# Patient Record
Sex: Female | Born: 1955 | Race: White | Hispanic: No | Marital: Married | Smoking: Never smoker
Health system: Southern US, Community
[De-identification: ages and names within clinical notes are randomized; demographics above are authoritative.]

## PROBLEM LIST (undated history)

## (undated) DIAGNOSIS — R519 Headache, unspecified: Secondary | ICD-10-CM

## (undated) DIAGNOSIS — Z923 Personal history of irradiation: Secondary | ICD-10-CM

## (undated) DIAGNOSIS — R0600 Dyspnea, unspecified: Secondary | ICD-10-CM

## (undated) DIAGNOSIS — E039 Hypothyroidism, unspecified: Secondary | ICD-10-CM

## (undated) DIAGNOSIS — C801 Malignant (primary) neoplasm, unspecified: Secondary | ICD-10-CM

## (undated) DIAGNOSIS — I10 Essential (primary) hypertension: Secondary | ICD-10-CM

## (undated) HISTORY — DX: Malignant (primary) neoplasm, unspecified: C80.1

## (undated) HISTORY — DX: Personal history of irradiation: Z92.3

## (undated) HISTORY — PX: ABDOMINAL HYSTERECTOMY: SHX81

---

## 2005-05-13 ENCOUNTER — Ambulatory Visit: Payer: Self-pay | Admitting: Unknown Physician Specialty

## 2006-07-13 ENCOUNTER — Ambulatory Visit: Payer: Self-pay | Admitting: Unknown Physician Specialty

## 2007-07-18 ENCOUNTER — Ambulatory Visit: Payer: Self-pay | Admitting: Unknown Physician Specialty

## 2009-04-21 ENCOUNTER — Ambulatory Visit: Payer: Self-pay | Admitting: Unknown Physician Specialty

## 2009-04-24 ENCOUNTER — Ambulatory Visit: Payer: Self-pay | Admitting: Unknown Physician Specialty

## 2009-04-29 ENCOUNTER — Ambulatory Visit: Payer: Self-pay | Admitting: Unknown Physician Specialty

## 2009-07-16 ENCOUNTER — Ambulatory Visit: Payer: Self-pay

## 2011-04-06 ENCOUNTER — Ambulatory Visit: Payer: Self-pay | Admitting: Family Medicine

## 2011-04-20 ENCOUNTER — Ambulatory Visit: Payer: Self-pay | Admitting: Family Medicine

## 2012-10-19 DIAGNOSIS — Z8589 Personal history of malignant neoplasm of other organs and systems: Secondary | ICD-10-CM

## 2012-10-19 HISTORY — DX: Personal history of malignant neoplasm of other organs and systems: Z85.89

## 2013-11-09 LAB — HM PAP SMEAR: HM PAP: NORMAL

## 2013-12-24 DIAGNOSIS — E890 Postprocedural hypothyroidism: Secondary | ICD-10-CM | POA: Insufficient documentation

## 2014-02-28 ENCOUNTER — Ambulatory Visit: Payer: Self-pay | Admitting: Nurse Practitioner

## 2014-05-11 LAB — HM MAMMOGRAPHY: HM MAMMO: NORMAL

## 2014-09-10 ENCOUNTER — Ambulatory Visit (INDEPENDENT_AMBULATORY_CARE_PROVIDER_SITE_OTHER): Payer: Self-pay | Admitting: Internal Medicine

## 2014-09-10 ENCOUNTER — Encounter: Payer: Self-pay | Admitting: Internal Medicine

## 2014-09-10 VITALS — BP 120/82 | HR 65 | Temp 97.7°F | Ht 64.25 in | Wt 139.0 lb

## 2014-09-10 DIAGNOSIS — Z1211 Encounter for screening for malignant neoplasm of colon: Secondary | ICD-10-CM | POA: Diagnosis not present

## 2014-09-10 DIAGNOSIS — E039 Hypothyroidism, unspecified: Secondary | ICD-10-CM

## 2014-09-10 DIAGNOSIS — G4700 Insomnia, unspecified: Secondary | ICD-10-CM | POA: Diagnosis not present

## 2014-09-10 DIAGNOSIS — G43809 Other migraine, not intractable, without status migrainosus: Secondary | ICD-10-CM | POA: Diagnosis not present

## 2014-09-10 DIAGNOSIS — G43909 Migraine, unspecified, not intractable, without status migrainosus: Secondary | ICD-10-CM | POA: Insufficient documentation

## 2014-09-10 MED ORDER — ZOLPIDEM TARTRATE 10 MG PO TABS: 10.0000 mg | ORAL_TABLET | Freq: Every day | ORAL | Status: DC

## 2014-09-10 MED ORDER — LEVOTHYROXINE SODIUM 50 MCG PO TABS: 50.0000 ug | ORAL_TABLET | Freq: Every day | ORAL | Status: DC

## 2014-09-10 MED ORDER — SUMATRIPTAN SUCCINATE 50 MG PO TABS: 50.0000 mg | ORAL_TABLET | ORAL | Status: DC | PRN

## 2014-09-10 NOTE — Assessment & Plan Note (Signed)
Will set up referral to Dr. Hilarie Fredrickson for colonoscopy.

## 2014-09-10 NOTE — Patient Instructions (Addendum)
Fasting labs next week.  Follow up in 4 weeks.

## 2014-09-10 NOTE — Assessment & Plan Note (Signed)
Will check TSH with labs. Reviewed notes from Dr. Gabriel Carina. Continue Levothyroxine.

## 2014-09-10 NOTE — Assessment & Plan Note (Signed)
Symptoms controlled with prn use of Ambien. Will continue. She understands the potential risks of this medication.

## 2014-09-10 NOTE — Progress Notes (Signed)
Subjective:    Patient ID: Jennifer Clayton, female    DOB: [birth year], 59 y.o.   MRN: [id number]  HPI  59YO female presents to establish care.  Migraines - Has had migraines for years. Typically has migraines only intermittently now, about 2x per month. Some food such as cantelope, watermelon trigger headaches. Weather can trigger headaches. Imitrex alleviates headache. Takes only 1/4 tablet. Has pain located in right eye and temple during headache. No blurred vision. No nausea recently.Pos photo and phone phobia.  Insomnia - Uses Ambien on occasion for sleep. Typically with work stressors. No issues with medication. Only uses 1/4 to 1/2 tablet.  Hypothyroidism - Followed by Dr. Mickie Kay. Compliant with medication.   Past Medical History  Diagnosis Date  . Cancer     skin cancer (left leg)  . H/O radioactive iodine thyroid ablation    Family History  Problem Relation Age of Onset  . Cancer Mother     lung, non-smoker  . Dementia Father     related to traumatic brain injury   Past Surgical History  Procedure Laterality Date  . Abdominal hysterectomy      Age 80, for bleeding and cyst  . Vaginal delivery     Social History   Social History  . Marital Status: Married    Spouse Name: N/A  . Number of Children: N/A  . Years of Education: N/A   Social History Main Topics  . Smoking status: Never Smoker   . Smokeless tobacco: None  . Alcohol Use: 0.6 oz/week    1 Glasses of wine per week  . Drug Use: None  . Sexual Activity: Not Asked   Other Topics Concern  . None   Social History Narrative   Lives in [place] with husband and son and 4 children.      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  Eyes: Negative for visual disturbance.  Respiratory:  Negative for shortness of breath.   Cardiovascular: Negative for chest pain and leg swelling.  Gastrointestinal: Negative for nausea, vomiting, abdominal pain, diarrhea and constipation.  Musculoskeletal: Negative for myalgias and arthralgias.  Skin: Negative for color change and rash.  Hematological: Negative for adenopathy. Does not bruise/bleed easily.  Psychiatric/Behavioral: Positive for sleep disturbance. Negative for dysphoric mood. The patient is not nervous/anxious.        Objective:    BP 120/82 mmHg  Pulse 65  Temp(Src) 97.7 F (36.5 C) (Oral)  Ht 5' 4.25" (1.632 m)  Wt 139 lb (63.05 kg)  BMI 23.67 kg/m2  SpO2 99% Physical Exam  Constitutional: She is oriented to person, place, and time. She appears well-developed and well-nourished. No distress.  HENT:  Head: Normocephalic and atraumatic.  Right Ear: External ear normal.  Left Ear: External ear normal.  Nose: Nose normal.  Mouth/Throat: Oropharynx is clear and moist. No oropharyngeal exudate.  Eyes: Conjunctivae and EOM are normal. Pupils are equal, round, and reactive to light. Right eye exhibits no discharge.  Neck: Normal range of motion. Neck supple. No thyromegaly present.  Cardiovascular: Normal rate, regular rhythm, normal heart sounds and intact distal pulses.  Exam reveals no gallop and no friction rub.   No murmur heard. Pulmonary/Chest: Effort normal. No respiratory distress.  She has no wheezes. She has no rales.  Abdominal: Soft. Bowel sounds are normal. She exhibits no distension and no mass. There is no tenderness. There is no rebound and no guarding.  Musculoskeletal: Normal range of motion. She exhibits no edema or tenderness.  Lymphadenopathy:    She has no cervical adenopathy.  Neurological: She is alert and oriented to person, place, and time. No cranial nerve deficit. Coordination normal.  Skin: Skin is warm and dry. No rash noted. She is not diaphoretic. No erythema. No pallor.  Psychiatric: She  has a normal mood and affect. Her behavior is normal. Judgment and thought content normal.          Assessment & Plan:   Problem List Items Addressed This Visit      Unprioritized   Hypothyroidism - Primary    Will check TSH with labs. Reviewed notes from Dr. Gabriel Carina. Continue Levothyroxine.      Relevant Medications   levothyroxine (SYNTHROID, LEVOTHROID) 50 MCG tablet   Other Relevant Orders   TSH   Insomnia    Symptoms controlled with prn use of Ambien. Will continue. She understands the potential risks of this medication.      Migraine    Symptoms well controlled with prn Sumitriptan. Will continue. Follow up prn.      Relevant Medications   SUMAtriptan (IMITREX) 50 MG tablet   Other Relevant Orders   CBC with Differential/Platelet   Comprehensive metabolic panel   Lipid panel   Special screening for malignant neoplasms, colon    Will set up referral to Dr. Hilarie Fredrickson for colonoscopy.      Relevant Orders   Ambulatory referral to Gastroenterology       Return in about 4 weeks (around 10/08/2014) for Recheck.

## 2014-09-10 NOTE — Assessment & Plan Note (Signed)
Symptoms well controlled with prn Sumitriptan. Will continue. Follow up prn.

## 2014-09-26 ENCOUNTER — Other Ambulatory Visit (INDEPENDENT_AMBULATORY_CARE_PROVIDER_SITE_OTHER): Payer: BLUE CROSS/BLUE SHIELD

## 2014-09-26 DIAGNOSIS — G43809 Other migraine, not intractable, without status migrainosus: Secondary | ICD-10-CM

## 2014-09-26 DIAGNOSIS — E039 Hypothyroidism, unspecified: Secondary | ICD-10-CM | POA: Diagnosis not present

## 2014-10-02 ENCOUNTER — Telehealth: Payer: Self-pay | Admitting: *Deleted

## 2014-10-02 ENCOUNTER — Other Ambulatory Visit (INDEPENDENT_AMBULATORY_CARE_PROVIDER_SITE_OTHER): Payer: BLUE CROSS/BLUE SHIELD

## 2014-10-02 DIAGNOSIS — E039 Hypothyroidism, unspecified: Secondary | ICD-10-CM

## 2014-10-02 DIAGNOSIS — G43809 Other migraine, not intractable, without status migrainosus: Secondary | ICD-10-CM

## 2014-10-02 DIAGNOSIS — I519 Heart disease, unspecified: Principal | ICD-10-CM

## 2014-10-02 DIAGNOSIS — G43009 Migraine without aura, not intractable, without status migrainosus: Secondary | ICD-10-CM

## 2014-10-02 LAB — CBC WITH DIFFERENTIAL/PLATELET
BASOS ABS: 0.1 10*3/uL (ref 0.0–0.1)
Basophils Relative: 1 % (ref 0.0–3.0)
Eosinophils Absolute: 0.1 10*3/uL (ref 0.0–0.7)
Eosinophils Relative: 1.3 % (ref 0.0–5.0)
HEMATOCRIT: 39.8 % (ref 36.0–46.0)
Hemoglobin: 13.3 g/dL (ref 12.0–15.0)
LYMPHS ABS: 2 10*3/uL (ref 0.7–4.0)
LYMPHS PCT: 30.9 % (ref 12.0–46.0)
MCHC: 33.4 g/dL (ref 30.0–36.0)
MCV: 93.6 fl (ref 78.0–100.0)
MONOS PCT: 6.7 % (ref 3.0–12.0)
Monocytes Absolute: 0.4 10*3/uL (ref 0.1–1.0)
NEUTROS PCT: 60.1 % (ref 43.0–77.0)
Neutro Abs: 3.9 10*3/uL (ref 1.4–7.7)
Platelets: 241 10*3/uL (ref 150.0–400.0)
RBC: 4.26 Mil/uL (ref 3.87–5.11)
RDW: 12.8 % (ref 11.5–15.5)
WBC: 6.4 10*3/uL (ref 4.0–10.5)

## 2014-10-02 LAB — COMPREHENSIVE METABOLIC PANEL
ALK PHOS: 55 U/L (ref 39–117)
ALT: 9 U/L (ref 0–35)
AST: 14 U/L (ref 0–37)
Albumin: 4.2 g/dL (ref 3.5–5.2)
BILIRUBIN TOTAL: 0.6 mg/dL (ref 0.2–1.2)
BUN: 10 mg/dL (ref 6–23)
CO2: 30 mEq/L (ref 19–32)
Calcium: 9 mg/dL (ref 8.4–10.5)
Chloride: 101 mEq/L (ref 96–112)
Creatinine, Ser: 0.78 mg/dL (ref 0.40–1.20)
GFR: 79.66 mL/min (ref >60.00)
Glucose, Bld: 92 mg/dL (ref 70–99)
Potassium: 4.6 mEq/L (ref 3.5–5.1)
Sodium: 136 mEq/L (ref 135–145)
TOTAL PROTEIN: 6.6 g/dL (ref 6.0–8.3)

## 2014-10-02 LAB — TSH: TSH: 12.94 u[IU]/mL — AB (ref 0.35–4.50)

## 2014-10-02 LAB — LIPID PANEL
CHOLESTEROL: 171 mg/dL (ref 0–200)
HDL: 52.7 mg/dL (ref >39.00)
LDL Cholesterol: 105 mg/dL — ABNORMAL HIGH (ref 0–99)
NonHDL: 118.21
Total CHOL/HDL Ratio: 3
Triglycerides: 68 mg/dL (ref 0.0–149.0)
VLDL: 13.6 mg/dL (ref 0.0–40.0)

## 2014-10-02 NOTE — Telephone Encounter (Signed)
Labs are in system

## 2014-10-02 NOTE — Telephone Encounter (Signed)
Labs and dx?  

## 2014-10-13 ENCOUNTER — Encounter: Payer: Self-pay | Admitting: Internal Medicine

## 2014-10-13 ENCOUNTER — Ambulatory Visit (INDEPENDENT_AMBULATORY_CARE_PROVIDER_SITE_OTHER): Payer: BLUE CROSS/BLUE SHIELD | Admitting: Internal Medicine

## 2014-10-13 VITALS — BP 125/83 | HR 64 | Temp 97.8°F | Ht 64.25 in | Wt 132.0 lb

## 2014-10-13 DIAGNOSIS — E039 Hypothyroidism, unspecified: Secondary | ICD-10-CM

## 2014-10-13 MED ORDER — LEVOTHYROXINE SODIUM 75 MCG PO TABS: 75.0000 ug | ORAL_TABLET | Freq: Every day | ORAL | Status: DC

## 2014-10-13 NOTE — Progress Notes (Signed)
Pre visit review using our clinic review tool, if applicable. No additional management support is needed unless otherwise documented below in the visit note. 

## 2014-10-13 NOTE — Assessment & Plan Note (Signed)
Lab Results  Component Value Date   TSH 12.94* 10/02/2014   Thyroid levels low on Levothyroxine 92mcg daily. Will increase to 65mcg daily. Recheck TSH in 6-8 weeks.

## 2014-10-13 NOTE — Patient Instructions (Signed)
Increase Levothyroxine to 75mg  daily.  Recheck labs in 6-8 weeks.

## 2014-10-13 NOTE — Progress Notes (Signed)
Subjective:    Patient ID: Jennifer Clayton, female    DOB: 1955/06/05, 59 y.o.   MRN: [id number]  HPI  59YO female presents for follow up.  Last seen 8/10 to establish care. TSH was noted to be elevated at 12.94.  Feeling well. No concerns today.    Past Medical History  Diagnosis Date  . Cancer     skin cancer (left leg)  . H/O radioactive iodine thyroid ablation    Family History  Problem Relation Age of Onset  . Cancer Mother     lung, non-smoker  . Dementia Father     related to traumatic brain injury   Past Surgical History  Procedure Laterality Date  . Abdominal hysterectomy      Age 56, for bleeding and cyst  . Vaginal delivery     Social History   Social History  . Marital Status: Married    Spouse Name: N/A  . Number of Children: N/A  . Years of Education: N/A   Social History Main Topics  . Smoking status: Never Smoker   . Smokeless tobacco: None  . Alcohol Use: 0.6 oz/week    1 Glasses of wine per week  . Drug Use: None  . Sexual Activity: Not Asked   Other Topics Concern  . None   Social History Narrative   Lives in [place] with husband and son and 4 children.      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  Eyes: Negative for visual disturbance.  Respiratory: Negative for shortness of breath.   Cardiovascular: Negative for chest pain and leg swelling.  Gastrointestinal: Negative for nausea, vomiting, abdominal pain, diarrhea and constipation.  Endocrine: Negative for cold intolerance and heat intolerance.  Musculoskeletal: Negative for myalgias and arthralgias.  Skin: Negative for color change and rash.  Hematological: Negative for adenopathy. Does not bruise/bleed easily.  Psychiatric/Behavioral: Negative for sleep  disturbance and dysphoric mood. The patient is not nervous/anxious.        Objective:    BP 125/83 mmHg  Pulse 64  Temp(Src) 97.8 F (36.6 C) (Oral)  Ht 5' 4.25" (1.632 m)  Wt 132 lb (59.875 kg)  BMI 22.48 kg/m2  SpO2 98% Physical Exam  Constitutional: She is oriented to person, place, and time. She appears well-developed and well-nourished. No distress.  HENT:  Head: Normocephalic and atraumatic.  Right Ear: External ear normal.  Left Ear: External ear normal.  Nose: Nose normal.  Mouth/Throat: Oropharynx is clear and moist. No oropharyngeal exudate.  Eyes: Conjunctivae are normal. Pupils are equal, round, and reactive to light. Right eye exhibits no discharge. Left eye exhibits no discharge. No scleral icterus.  Neck: Normal range of motion. Neck supple. No tracheal deviation present. No thyromegaly present.  Cardiovascular: Normal rate, regular rhythm, normal heart sounds and intact distal pulses.  Exam reveals no gallop and no friction rub.   No murmur heard. Pulmonary/Chest: Effort normal and breath sounds normal. No respiratory distress. She has no wheezes. She has no rales. She exhibits no tenderness.  Musculoskeletal: Normal range of motion. She exhibits no edema or tenderness.  Lymphadenopathy:    She has no cervical adenopathy.  Neurological: She is alert and oriented to person, place, and time.  No cranial nerve deficit. She exhibits normal muscle tone. Coordination normal.  Skin: Skin is warm and dry. No rash noted. She is not diaphoretic. No erythema. No pallor.  Psychiatric: She has a normal mood and affect. Her behavior is normal. Judgment and thought content normal.          Assessment & Plan:   Problem List Items Addressed This Visit      Unprioritized   Hypothyroidism - Primary    Lab Results  Component Value Date   TSH 12.94* 10/02/2014   Thyroid levels low on Levothyroxine 12mcg daily. Will increase to 70mcg daily. Recheck TSH in 6-8 weeks.       Relevant Medications   levothyroxine (SYNTHROID, LEVOTHROID) 75 MCG tablet   Other Relevant Orders   TSH       Return in about 1 year (around 10/13/2015) for Physical.

## 2015-02-19 ENCOUNTER — Encounter: Payer: Self-pay | Admitting: Family Medicine

## 2015-02-19 ENCOUNTER — Ambulatory Visit (INDEPENDENT_AMBULATORY_CARE_PROVIDER_SITE_OTHER): Payer: BLUE CROSS/BLUE SHIELD | Admitting: Family Medicine

## 2015-02-19 VITALS — BP 110/70 | HR 60 | Temp 98.1°F | Ht 64.25 in | Wt 130.5 lb

## 2015-02-19 DIAGNOSIS — R053 Chronic cough: Secondary | ICD-10-CM | POA: Insufficient documentation

## 2015-02-19 DIAGNOSIS — J209 Acute bronchitis, unspecified: Secondary | ICD-10-CM | POA: Diagnosis not present

## 2015-02-19 DIAGNOSIS — J40 Bronchitis, not specified as acute or chronic: Secondary | ICD-10-CM | POA: Insufficient documentation

## 2015-02-19 MED ORDER — PREDNISONE 50 MG PO TABS: ORAL_TABLET | ORAL | Status: DC

## 2015-02-19 MED ORDER — HYDROCOD POLST-CPM POLST ER 10-8 MG/5ML PO SUER: 5.0000 mL | Freq: Two times a day (BID) | ORAL | Status: DC | PRN

## 2015-02-19 MED ORDER — DOXYCYCLINE HYCLATE 100 MG PO TABS: 100.0000 mg | ORAL_TABLET | Freq: Two times a day (BID) | ORAL | Status: DC

## 2015-02-19 NOTE — Assessment & Plan Note (Signed)
New problem. Given duration of illness, treating with prednisone, Tussionex, and Doxy.

## 2015-02-19 NOTE — Progress Notes (Signed)
Pre visit review using our clinic review tool, if applicable. No additional management support is needed unless otherwise documented below in the visit note. 

## 2015-02-19 NOTE — Patient Instructions (Signed)
Take the prednisone and the antibiotic as prescribed.  Use the cough medication as directed.  Follow up if you fail to improve or worsen.  Take care  Dr. Lacinda Axon   Acute Bronchitis Bronchitis is inflammation of the airways that extend from the windpipe into the lungs (bronchi). The inflammation often causes mucus to develop. This leads to a cough, which is the most common symptom of bronchitis.  In acute bronchitis, the condition usually develops suddenly and goes away over time, usually in a couple weeks. Smoking, allergies, and asthma can make bronchitis worse. Repeated episodes of bronchitis may cause further lung problems.  CAUSES Acute bronchitis is most often caused by the same virus that causes a cold. The virus can spread from person to person (contagious) through coughing, sneezing, and touching contaminated objects. SIGNS AND SYMPTOMS   Cough.   Fever.   Coughing up mucus.   Body aches.   Chest congestion.   Chills.   Shortness of breath.   Sore throat.  DIAGNOSIS  Acute bronchitis is usually diagnosed through a physical exam. Your health care provider will also ask you questions about your medical history. Tests, such as chest X-rays, are sometimes done to rule out other conditions.  TREATMENT  Acute bronchitis usually goes away in a couple weeks. Oftentimes, no medical treatment is necessary. Medicines are sometimes given for relief of fever or cough. Antibiotic medicines are usually not needed but may be prescribed in certain situations. In some cases, an inhaler may be recommended to help reduce shortness of breath and control the cough. A cool mist vaporizer may also be used to help thin bronchial secretions and make it easier to clear the chest.  HOME CARE INSTRUCTIONS  Get plenty of rest.   Drink enough fluids to keep your urine clear or pale yellow (unless you have a medical condition that requires fluid restriction). Increasing fluids may help thin  your respiratory secretions (sputum) and reduce chest congestion, and it will prevent dehydration.   Take medicines only as directed by your health care provider.  If you were prescribed an antibiotic medicine, finish it all even if you start to feel better.  Avoid smoking and secondhand smoke. Exposure to cigarette smoke or irritating chemicals will make bronchitis worse. If you are a smoker, consider using nicotine gum or skin patches to help control withdrawal symptoms. Quitting smoking will help your lungs heal faster.   Reduce the chances of another bout of acute bronchitis by washing your hands frequently, avoiding people with cold symptoms, and trying not to touch your hands to your mouth, nose, or eyes.   Keep all follow-up visits as directed by your health care provider.  SEEK MEDICAL CARE IF: Your symptoms do not improve after 1 week of treatment.  SEEK IMMEDIATE MEDICAL CARE IF:  You develop an increased fever or chills.   You have chest pain.   You have severe shortness of breath.  You have bloody sputum.   You develop dehydration.  You faint or repeatedly feel like you are going to pass out.  You develop repeated vomiting.  You develop a severe headache. MAKE SURE YOU:   Understand these instructions.  Will watch your condition.  Will get help right away if you are not doing well or get worse.   This information is not intended to replace advice given to you by your health care provider. Make sure you discuss any questions you have with your health care provider.   Document Released:  02/25/2004 Document Revised: 02/07/2014 Document Reviewed: 07/10/2012 Elsevier Interactive Patient Education [employer].

## 2015-02-19 NOTE — Progress Notes (Signed)
Subjective:  Patient ID: Eulis Manly, female    DOB: 04-Oct-1955  Age: 60 y.o. MRN: [id number]  CC: Respiratory symptom & Cough  HPI:  60 year old female presents to the clinic today with complaints of cough, sore throat, drainage.  Patient reports that she's been sick since Christmas eve. She's been slowly worsening. She is now experiencing significant cough which is mildly productive. No associated fevers or chills. No shortness of breath. She's been taking Mucinex with no resolution.  Social Hx   Social History   Social History  . Marital Status: Married    Spouse Name: N/A  . Number of Children: N/A  . Years of Education: N/A   Social History Main Topics  . Smoking status: Never Smoker   . Smokeless tobacco: None  . Alcohol Use: 0.6 oz/week    1 Glasses of wine per week  . Drug Use: None  . Sexual Activity: Not Asked   Other Topics Concern  . None   Social History Narrative   Lives in [place] with husband and son and 4 children.      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   HENT: Positive for sore throat.   Respiratory: Positive for cough.    Objective:  BP 110/70 mmHg  Pulse 60  Temp(Src) 98.1 F (36.7 C) (Oral)  Ht 5' 4.25" (1.632 m)  Wt 130 lb 8 oz (59.194 kg)  BMI 22.22 kg/m2  SpO2 98%  BP/Weight 02/19/2015 10/13/2014 [id number]  Systolic BP [id number] [id number]  Diastolic BP 70 83 82  Wt. (Lbs) 130.5 132 139  BMI 22.22 22.48 23.67   Physical Exam  Constitutional: She appears well-developed. No distress.  HENT:  Head: Normocephalic and atraumatic.  Mouth/Throat: Oropharynx is clear and moist.  Normal TM's bilaterally.   Cardiovascular: Normal rate and regular rhythm.   Pulmonary/Chest: Effort normal and breath sounds normal. No respiratory distress. She has no wheezes. She has no rales.  Neurological: She  is alert.  Psychiatric: She has a normal mood and affect.  Vitals reviewed.  Lab Results  Component Value Date   WBC 6.4 10/02/2014   HGB 13.3 10/02/2014   HCT 39.8 10/02/2014   PLT 241.0 10/02/2014   GLUCOSE 92 10/02/2014   CHOL 171 10/02/2014   TRIG 68.0 10/02/2014   HDL 52.70 10/02/2014   LDLCALC 105* 10/02/2014   ALT 9 10/02/2014   AST 14 10/02/2014   NA 136 10/02/2014   K 4.6 10/02/2014   CL 101 10/02/2014   CREATININE 0.78 10/02/2014   BUN 10 10/02/2014   CO2 30 10/02/2014   TSH 12.94* 10/02/2014    Assessment & Plan:   Problem List Items Addressed This Visit    Acute bronchitis - Primary    New problem. Given duration of illness, treating with prednisone, Tussionex, and Doxy.         Meds ordered this encounter  Medications  . predniSONE (DELTASONE) 50 MG tablet    Sig: 1 tablet daily x 5 days.    Dispense:  5 tablet    Refill:  0  . chlorpheniramine-HYDROcodone (TUSSIONEX PENNKINETIC ER) 10-8 MG/5ML SUER    Sig: Take 5 mLs by mouth every 12 (twelve) hours as needed.    Dispense:  115 mL    Refill:  0  . doxycycline (VIBRA-TABS) 100 MG tablet    Sig: Take 1 tablet (100 mg total) by mouth 2 (two) times daily.    Dispense:  14 tablet    Refill:  0    Follow-up: No Follow-up on file.  [place]

## 2015-06-04 ENCOUNTER — Other Ambulatory Visit: Payer: Self-pay | Admitting: Internal Medicine

## 2015-06-08 ENCOUNTER — Other Ambulatory Visit: Payer: Self-pay | Admitting: Internal Medicine

## 2015-06-09 NOTE — Telephone Encounter (Signed)
Last OV 10/13/2014 Last filled 09/10/2014

## 2015-06-10 NOTE — Telephone Encounter (Signed)
Faxed to pharmacy

## 2015-10-12 ENCOUNTER — Other Ambulatory Visit: Payer: Self-pay | Admitting: Family

## 2015-10-12 ENCOUNTER — Encounter: Payer: Self-pay | Admitting: Family

## 2015-10-12 ENCOUNTER — Ambulatory Visit (INDEPENDENT_AMBULATORY_CARE_PROVIDER_SITE_OTHER): Payer: 59 | Admitting: Family

## 2015-10-12 VITALS — BP 128/80 | HR 73 | Temp 98.4°F | Wt 131.0 lb

## 2015-10-12 DIAGNOSIS — R05 Cough: Secondary | ICD-10-CM

## 2015-10-12 DIAGNOSIS — R059 Cough, unspecified: Secondary | ICD-10-CM

## 2015-10-12 MED ORDER — AZITHROMYCIN 250 MG PO TABS: ORAL_TABLET | ORAL | 0 refills | Status: DC

## 2015-10-12 NOTE — Progress Notes (Signed)
Subjective:    Patient ID: Jennifer Clayton, female    DOB: [birth year], 60 y.o.   MRN: [id number]  CC: Jennifer Clayton is a 60 y.o. female who presents today for an acute visit.    HPI: Patient here for productive cough, congestion for 2 weeks. Has tried mucinex without relief. Endorses some wheezing. Denies exertional chest pain or pressure, numbness or tingling radiating to left arm or jaw, palpitations, dizziness, frequent headaches, changes in vision, or shortness of breath.   Patient will let us know who she plans to establish with and make CPE in the next couple of months.     HISTORY:  Past Medical History:  Diagnosis Date  . Cancer ([place])    skin cancer (left leg)  . H/O radioactive iodine thyroid ablation    Past Surgical History:  Procedure Laterality Date  . ABDOMINAL HYSTERECTOMY     Age 27, for bleeding and cyst  . VAGINAL DELIVERY     Family History  Problem Relation Age of Onset  . Cancer Mother     lung, non-smoker  . Dementia Father     related to traumatic brain injury    Allergies: Zomepirac; Amoxicillin-pot clavulanate; and Other Current Outpatient Prescriptions on File Prior to Visit  Medication Sig Dispense Refill  . chlorpheniramine-HYDROcodone (TUSSIONEX PENNKINETIC ER) 10-8 MG/5ML SUER Take 5 mLs by mouth every 12 (twelve) hours as needed. 115 mL 0  . doxycycline (VIBRA-TABS) 100 MG tablet Take 1 tablet (100 mg total) by mouth 2 (two) times daily. 14 tablet 0  . levothyroxine (SYNTHROID, LEVOTHROID) 50 MCG tablet TAKE ONE TABLET BY MOUTH EVERY DAY BEFORE BREAKFAST 90 tablet 3  . levothyroxine (SYNTHROID, LEVOTHROID) 75 MCG tablet Take 1 tablet (75 mcg total) by mouth daily before breakfast. 90 tablet 3  . predniSONE (DELTASONE) 50 MG tablet 1 tablet daily x 5 days. 5 tablet 0  . SUMAtriptan (IMITREX) 50 MG tablet Take 1 tablet (50 mg total) by mouth every 2 (two) hours as needed for migraine. May repeat in 2 hours if headache persists or recurs.  10 tablet 6  . zolpidem (AMBIEN) 10 MG tablet TAKE ONE TABLET BY MOUTH AT BEDTIME 90 tablet 0   No current facility-administered medications on file prior to visit.     Social History  Substance Use Topics  . Smoking status: Never Smoker  . Smokeless tobacco: Not on file  . Alcohol use 0.6 oz/week    1 Glasses of wine per week    Review of Systems  Constitutional: Negative for chills and fever.  HENT: Positive for congestion and sinus pressure. Negative for ear pain.   Respiratory: Positive for cough and wheezing. Negative for shortness of breath.   Cardiovascular: Negative for chest pain and palpitations.  Gastrointestinal: Negative for nausea and vomiting.      Objective:    BP 128/80   Pulse 73   Temp 98.4 F (36.9 C) (Oral)   Wt 131 lb (59.4 kg)   SpO2 98%   BMI 22.31 kg/m    Physical Exam  Constitutional: She appears well-developed and well-nourished.  HENT:  Head: Normocephalic and atraumatic.  Right Ear: Hearing, tympanic membrane, external ear and ear canal normal. No drainage, swelling or tenderness. No foreign bodies. Tympanic membrane is not erythematous and not bulging. No middle ear effusion. No decreased hearing is noted.  Left Ear: Hearing, tympanic membrane, external ear and ear canal normal. No drainage, swelling or tenderness. No foreign bodies. Tympanic membrane  is not erythematous and not bulging.  No middle ear effusion. No decreased hearing is noted.  Nose: Nose normal. No rhinorrhea. Right sinus exhibits no maxillary sinus tenderness and no frontal sinus tenderness. Left sinus exhibits no maxillary sinus tenderness and no frontal sinus tenderness.  Mouth/Throat: Uvula is midline, oropharynx is clear and moist and mucous membranes are normal. No oropharyngeal exudate, posterior oropharyngeal edema, posterior oropharyngeal erythema or tonsillar abscesses.  Eyes: Conjunctivae are normal.  Cardiovascular: Regular rhythm, normal heart sounds and normal  pulses.   Pulmonary/Chest: Effort normal and breath sounds normal. She has no wheezes. She has no rhonchi. She has no rales.  Lymphadenopathy:       Head (right side): No submental, no submandibular, no tonsillar, no preauricular, no posterior auricular and no occipital adenopathy present.       Head (left side): No submental, no submandibular, no tonsillar, no preauricular, no posterior auricular and no occipital adenopathy present.    She has no cervical adenopathy.  Neurological: She is alert.  Skin: Skin is warm and dry.  Psychiatric: She has a normal mood and affect. Her speech is normal and behavior is normal. Thought content normal.  Vitals reviewed.      Assessment & Plan:    1. Cough Based on duration of symptoms, I prescribed antibiotic for suspected bacterial URI. No respiratory distress, afebrile.  - azithromycin (ZITHROMAX) 250 MG tablet; Tale 500 mg PO on day 1, then 250 mg PO q24h x 4 days.  Dispense: 6 tablet; Refill: 0   I am having [name] maintain her SUMAtriptan, levothyroxine, predniSONE, chlorpheniramine-HYDROcodone, doxycycline, levothyroxine, and zolpidem.   No orders of the defined types were placed in this encounter.   Return precautions given.   Risks, benefits, and alternatives of the medications and treatment plan prescribed today were discussed, and patient expressed understanding.   Education regarding symptom management and diagnosis given to patient on AVS.  Continue to follow with Rica Mast, MD for routine health maintenance.   Lyn Henri Muellner and I agreed with plan.   Mable Paris, FNP

## 2015-10-12 NOTE — Progress Notes (Signed)
Pre visit review using our clinic review tool, if applicable. No additional management support is needed unless otherwise documented below in the visit note. 

## 2015-10-12 NOTE — Patient Instructions (Signed)

## 2015-10-14 ENCOUNTER — Encounter: Payer: BLUE CROSS/BLUE SHIELD | Admitting: Internal Medicine

## 2015-10-14 ENCOUNTER — Encounter: Payer: Self-pay | Admitting: Family

## 2015-10-14 MED ORDER — ZOLPIDEM TARTRATE 5 MG PO TABS: 5.0000 mg | ORAL_TABLET | Freq: Every day | ORAL | 0 refills | Status: DC

## 2015-10-14 NOTE — Telephone Encounter (Signed)
Refill request for Imitrex and Ambien, last seen [id number], last filled Ambien [id number] and the Imitrex [id number].  Please advise.

## 2015-10-14 NOTE — Telephone Encounter (Signed)
Unable to leave message

## 2015-10-14 NOTE — Telephone Encounter (Signed)
Please call patient and let know I will refill for one month. She needs to make CPE and establish with new provider.

## 2015-10-15 ENCOUNTER — Telehealth: Payer: Self-pay | Admitting: Family

## 2015-10-15 NOTE — Telephone Encounter (Signed)
Patient has made appointment and has been notified. 

## 2015-10-15 NOTE — Telephone Encounter (Signed)
Patient is scheduled for an establish care appointment. Thank you!

## 2015-10-15 NOTE — Telephone Encounter (Signed)
Left message for patient to return call back. Patient needs to schedule appointment to Establish care with PCP so she can receive further refills.

## 2015-10-16 ENCOUNTER — Telehealth: Payer: Self-pay | Admitting: Family

## 2015-10-16 DIAGNOSIS — R05 Cough: Secondary | ICD-10-CM

## 2015-10-16 DIAGNOSIS — R059 Cough, unspecified: Secondary | ICD-10-CM

## 2015-10-16 NOTE — Telephone Encounter (Signed)
Pt received phone call this morning. No message left. I do not see anything notated in her chart regarding call this morning.  She states that she was given a rx for zpack

## 2015-10-16 NOTE — Telephone Encounter (Signed)
Pt stated that Jennifer Clayton started her on the zpack Monday. She started the medication Tuesday. She took her last pill today. She is not feeling better. She is still coughing and has a lot of congestion. She states that she is taking mucinex as well. She wants to know if there is anything else she can take because she has not been able to sleep because of the coughing.

## 2015-10-16 NOTE — Telephone Encounter (Signed)
Benadryl? Please advise.

## 2015-10-17 NOTE — Telephone Encounter (Signed)
Spoke with patient today; no fever, she has started back on mucinex and OTC cough syrup. She will keep me posted if she gets worse. No fever,SOB.

## 2015-10-21 ENCOUNTER — Ambulatory Visit (INDEPENDENT_AMBULATORY_CARE_PROVIDER_SITE_OTHER): Payer: 59 | Admitting: Family

## 2015-10-21 ENCOUNTER — Encounter: Payer: Self-pay | Admitting: Family

## 2015-10-21 VITALS — BP 120/82 | HR 69 | Temp 98.2°F | Resp 16 | Ht 63.0 in | Wt 130.8 lb

## 2015-10-21 DIAGNOSIS — Z0000 Encounter for general adult medical examination without abnormal findings: Secondary | ICD-10-CM | POA: Insufficient documentation

## 2015-10-21 DIAGNOSIS — E039 Hypothyroidism, unspecified: Secondary | ICD-10-CM | POA: Diagnosis not present

## 2015-10-21 DIAGNOSIS — Z1211 Encounter for screening for malignant neoplasm of colon: Secondary | ICD-10-CM | POA: Insufficient documentation

## 2015-10-21 DIAGNOSIS — G43809 Other migraine, not intractable, without status migrainosus: Secondary | ICD-10-CM

## 2015-10-21 DIAGNOSIS — Z23 Encounter for immunization: Secondary | ICD-10-CM

## 2015-10-21 DIAGNOSIS — G4700 Insomnia, unspecified: Secondary | ICD-10-CM | POA: Diagnosis not present

## 2015-10-21 DIAGNOSIS — Z7689 Persons encountering health services in other specified circumstances: Secondary | ICD-10-CM

## 2015-10-21 NOTE — Assessment & Plan Note (Addendum)
Stable. Pending TSH.  

## 2015-10-21 NOTE — Patient Instructions (Signed)
Look forward to seeing at your physical.

## 2015-10-21 NOTE — Assessment & Plan Note (Signed)
Stable. HA similar to HA's in past. No symptoms of basilar or hemiplegic migraine. Continue PRN Imitrex for abortive therapy.

## 2015-10-21 NOTE — Assessment & Plan Note (Addendum)
Reviewed current and past medical, social and family history. Ordered screening labs for CPE which patient will return in 01/2016. Congratulated patient on exercise. Ordered mammogram ( scheduled for patient) and colonoscopy. Will discuss gaps in preventative care at CPE.

## 2015-10-21 NOTE — Progress Notes (Signed)
Subjective:    Patient ID: Jennifer Clayton, female    DOB: 02-01-1955, 60 y.o.   MRN: [id number]  CC: Jennifer Clayton is a 60 y.o. female who presents today for follow up.   HPI: Patient here to follow up on chronic disease and to establish care.   Hypothyoidism-  approx 7 years. Stable. No palpitations , cold/heat intolerance. No h/o thyroid FNA per patient.   Migraine- Started decades ago after miscarriage. Triggered with barometric pressure, hormones.  Quality, severity is unchanged. May use Imitrex 1-2 one month and then not the next. No changes in vision, facial paralysis, dizziness, lack of coordination during migrain. Usually right temporal. Sees a 'black spot, floater' in right eye when coming on. Has been seen Neurology in the past. No recent head imaging per chart review. Sister has migraine as well.  Insomnia- 4 years ago started, trouble falling asleep. No anxiety or depression. Takes 1/2 tablet every night. Exercises every day to control stress and help sleep. Would like to come off medication eventually.       HISTORY:  Past Medical History:  Diagnosis Date  . Cancer ([place])    skin cancer (left leg)  . H/O radioactive iodine thyroid ablation    Past Surgical History:  Procedure Laterality Date  . ABDOMINAL HYSTERECTOMY     Age 62, for bleeding and cyst  . VAGINAL DELIVERY     Family History  Problem Relation Age of Onset  . Cancer Mother     lung, non-smoker  . Dementia Father     related to traumatic brain injury    Allergies: Zomepirac; Amoxicillin-pot clavulanate; and Other Current Outpatient Prescriptions on File Prior to Visit  Medication Sig Dispense Refill  . levothyroxine (SYNTHROID, LEVOTHROID) 75 MCG tablet Take 1 tablet (75 mcg total) by mouth daily before breakfast. 90 tablet 3  . SUMAtriptan (IMITREX) 50 MG tablet TAKE 1 TABLET BY MOUTH EVERY 2 HOURS AS NEEDED FOR MIGRAINE. MAY REPEAT IN 2 HOURS IF HEADACHE PERSISTS OR RECURS. 10 tablet 0  .  zolpidem (AMBIEN) 5 MG tablet Take 1 tablet (5 mg total) by mouth at bedtime. 30 tablet 0   No current facility-administered medications on file prior to visit.     Social History  Substance Use Topics  . Smoking status: Never Smoker  . Smokeless tobacco: Never Used  . Alcohol use 0.6 oz/week    1 Glasses of wine per week    Review of Systems  Constitutional: Negative for chills, fever and unexpected weight change.  HENT: Negative for congestion.   Eyes: Negative for visual disturbance.  Respiratory: Negative for cough.   Cardiovascular: Negative for chest pain and palpitations.  Gastrointestinal: Negative for nausea and vomiting.  Endocrine: Negative for cold intolerance and heat intolerance.  Musculoskeletal: Negative for arthralgias and myalgias.  Skin: Negative for rash.  Neurological: Positive for headaches. Negative for dizziness, weakness and numbness.  Hematological: Negative for adenopathy.  Psychiatric/Behavioral: Positive for sleep disturbance. Negative for confusion. The patient is not nervous/anxious.       Objective:    BP 120/82 (BP Location: Left Arm, Patient Position: Sitting, Cuff Size: Normal)   Pulse 69   Temp 98.2 F (36.8 C) (Oral)   Resp 16   Ht 5\' 3"  (1.6 m)   Wt 130 lb 12.8 oz (59.3 kg)   SpO2 97%   BMI 23.17 kg/m  BP Readings from Last 3 Encounters:  10/21/15 120/82  10/12/15 128/80  02/19/15 110/70  Wt Readings from Last 3 Encounters:  10/21/15 130 lb 12.8 oz (59.3 kg)  10/12/15 131 lb (59.4 kg)  02/19/15 130 lb 8 oz (59.2 kg)    Physical Exam  Constitutional: She appears well-developed and well-nourished.  Eyes: Conjunctivae are normal.  Neck: No thyroid mass and no thyromegaly present.  Cardiovascular: Normal rate, regular rhythm, normal heart sounds and normal pulses.   Pulmonary/Chest: Effort normal and breath sounds normal. She has no wheezes. She has no rhonchi. She has no rales.  Lymphadenopathy:       Head (right side): No  submental, no submandibular, no tonsillar, no preauricular, no posterior auricular and no occipital adenopathy present.       Head (left side): No submental, no submandibular, no tonsillar, no preauricular, no posterior auricular and no occipital adenopathy present.    She has no cervical adenopathy.  Neurological: She is alert.  Skin: Skin is warm and dry.  Psychiatric: She has a normal mood and affect. Her speech is normal and behavior is normal. Thought content normal.  Vitals reviewed.      Assessment & Plan:   Problem List Items Addressed This Visit      Cardiovascular and Mediastinum   Migraine    Stable. HA similar to HA's in past. No symptoms of basilar or hemiplegic migraine. Continue PRN Imitrex for abortive therapy.         Endocrine   Hypothyroidism    Stable. Pending TSH.         Other   Insomnia    Stable. Discussed risks of Ambien and my caution with long term use. Discussed sleep hygiene and encouraged continued exercise. Patient will try to use less and work on lifestyle modifications. Will follow and continue to discuss at future visits.       Encounter to establish care - Primary    Reviewed current and past medical, social and family history. Ordered screening labs for CPE which patient will return in 01/2016. Congratulated patient on exercise. Ordered mammogram ( scheduled for patient) and colonoscopy. Will discuss gaps in preventative care at CPE.       Relevant Orders   MM DIGITAL SCREENING BILATERAL   Ambulatory referral to Gastroenterology   CBC with Differential/Platelet   Comprehensive metabolic panel   Hemoglobin A1c   Lipid panel   TSH   VITAMIN D 25 Hydroxy (Vit-D Deficiency, Fractures)   Hepatitis C antibody   HIV antibody    Other Visit Diagnoses    Encounter for immunization       Relevant Orders   Flu Vaccine QUAD 36+ mos IM (Completed)       I have discontinued Ms. Matlin's predniSONE, chlorpheniramine-HYDROcodone, doxycycline,  and azithromycin. I am also having her maintain her levothyroxine, SUMAtriptan, and zolpidem.   No orders of the defined types were placed in this encounter.   Return precautions given.   Risks, benefits, and alternatives of the medications and treatment plan prescribed today were discussed, and patient expressed understanding.   Education regarding symptom management and diagnosis given to patient on AVS.  Continue to follow with Mable Paris, FNP for routine health maintenance.   Lyn Henri Plantz and I agreed with plan.   Mable Paris, FNP

## 2015-10-21 NOTE — Progress Notes (Signed)
Pre visit review using our clinic review tool, if applicable. No additional management support is needed unless otherwise documented below in the visit note. 

## 2015-10-21 NOTE — Assessment & Plan Note (Addendum)
Stable. Discussed risks of Ambien and my caution with long term use. Discussed sleep hygiene and encouraged continued exercise. Patient will try to use less and work on lifestyle modifications. Will follow and continue to discuss at future visits.

## 2015-10-22 ENCOUNTER — Telehealth: Payer: Self-pay | Admitting: Family

## 2015-10-22 NOTE — Telephone Encounter (Signed)
Left pt a vm to call office to sch flu shot. °

## 2015-11-12 ENCOUNTER — Other Ambulatory Visit (INDEPENDENT_AMBULATORY_CARE_PROVIDER_SITE_OTHER): Payer: 59

## 2015-11-12 ENCOUNTER — Other Ambulatory Visit: Payer: Self-pay | Admitting: Family

## 2015-11-12 DIAGNOSIS — Z7689 Persons encountering health services in other specified circumstances: Secondary | ICD-10-CM | POA: Diagnosis not present

## 2015-11-12 DIAGNOSIS — E039 Hypothyroidism, unspecified: Secondary | ICD-10-CM

## 2015-11-12 LAB — LIPID PANEL
CHOLESTEROL: 179 mg/dL (ref 0–200)
HDL: 56.3 mg/dL (ref >39.00)
LDL Cholesterol: 110 mg/dL — ABNORMAL HIGH (ref 0–99)
NonHDL: 123
TRIGLYCERIDES: 67 mg/dL (ref 0.0–149.0)
Total CHOL/HDL Ratio: 3
VLDL: 13.4 mg/dL (ref 0.0–40.0)

## 2015-11-12 LAB — COMPREHENSIVE METABOLIC PANEL
ALK PHOS: 56 U/L (ref 39–117)
ALT: 11 U/L (ref 0–35)
AST: 14 U/L (ref 0–37)
Albumin: 4.2 g/dL (ref 3.5–5.2)
BUN: 12 mg/dL (ref 6–23)
CHLORIDE: 99 meq/L (ref 96–112)
CO2: 32 meq/L (ref 19–32)
Calcium: 9.5 mg/dL (ref 8.4–10.5)
Creatinine, Ser: 0.83 mg/dL (ref 0.40–1.20)
GFR: 73.88 mL/min (ref >60.00)
GLUCOSE: 98 mg/dL (ref 70–99)
POTASSIUM: 4.3 meq/L (ref 3.5–5.1)
SODIUM: 135 meq/L (ref 135–145)
Total Bilirubin: 0.5 mg/dL (ref 0.2–1.2)
Total Protein: 7.2 g/dL (ref 6.0–8.3)

## 2015-11-12 LAB — CBC WITH DIFFERENTIAL/PLATELET
Basophils Absolute: 0 10*3/uL (ref 0.0–0.1)
Basophils Relative: 0.4 % (ref 0.0–3.0)
EOS PCT: 1.8 % (ref 0.0–5.0)
Eosinophils Absolute: 0.1 10*3/uL (ref 0.0–0.7)
HCT: 40.6 % (ref 36.0–46.0)
Hemoglobin: 13.6 g/dL (ref 12.0–15.0)
LYMPHS ABS: 1.9 10*3/uL (ref 0.7–4.0)
Lymphocytes Relative: 34.8 % (ref 12.0–46.0)
MCHC: 33.6 g/dL (ref 30.0–36.0)
MCV: 92.2 fl (ref 78.0–100.0)
MONO ABS: 0.4 10*3/uL (ref 0.1–1.0)
MONOS PCT: 7.8 % (ref 3.0–12.0)
NEUTROS ABS: 3 10*3/uL (ref 1.4–7.7)
NEUTROS PCT: 55.2 % (ref 43.0–77.0)
PLATELETS: 279 10*3/uL (ref 150.0–400.0)
RBC: 4.41 Mil/uL (ref 3.87–5.11)
RDW: 12.6 % (ref 11.5–15.5)
WBC: 5.5 10*3/uL (ref 4.0–10.5)

## 2015-11-12 LAB — TSH: TSH: 8.81 u[IU]/mL — AB (ref 0.35–4.50)

## 2015-11-12 LAB — HEMOGLOBIN A1C: Hgb A1c MFr Bld: 5.7 % (ref 4.6–6.5)

## 2015-11-12 LAB — VITAMIN D 25 HYDROXY (VIT D DEFICIENCY, FRACTURES): VITD: 30.62 ng/mL (ref 30.00–100.00)

## 2015-11-12 MED ORDER — LEVOTHYROXINE SODIUM 88 MCG PO TABS: 88.0000 ug | ORAL_TABLET | Freq: Every day | ORAL | 1 refills | Status: DC

## 2015-11-13 LAB — HEPATITIS C ANTIBODY: HCV Ab: NEGATIVE

## 2015-11-13 LAB — HIV ANTIBODY (ROUTINE TESTING W REFLEX): HIV 1&2 Ab, 4th Generation: NONREACTIVE

## 2015-11-17 ENCOUNTER — Ambulatory Visit
Admission: RE | Admit: 2015-11-17 | Discharge: 2015-11-17 | Disposition: A | Discharge status: Home / Self Care | Payer: 59 | Source: Ambulatory Visit | Attending: Family | Admitting: Family

## 2015-11-17 ENCOUNTER — Other Ambulatory Visit: Payer: Self-pay | Admitting: Family

## 2015-11-17 DIAGNOSIS — Z7689 Persons encountering health services in other specified circumstances: Secondary | ICD-10-CM

## 2015-11-17 DIAGNOSIS — Z1231 Encounter for screening mammogram for malignant neoplasm of breast: Secondary | ICD-10-CM | POA: Insufficient documentation

## 2015-12-03 ENCOUNTER — Other Ambulatory Visit: Payer: Self-pay

## 2015-12-03 DIAGNOSIS — G4700 Insomnia, unspecified: Secondary | ICD-10-CM

## 2015-12-03 NOTE — Telephone Encounter (Signed)
Refill request for Zolpidem Tartrate, last seen [id number], last filled [id number].  Please advise.

## 2015-12-07 MED ORDER — ZOLPIDEM TARTRATE 5 MG PO TABS: 5.0000 mg | ORAL_TABLET | Freq: Every day | ORAL | 0 refills | Status: DC

## 2015-12-07 NOTE — Addendum Note (Signed)
Addended by: Burnard Hawthorne on: 12/07/2015 03:41 PM   Modules accepted: Orders

## 2015-12-09 ENCOUNTER — Other Ambulatory Visit: Payer: Self-pay | Admitting: Family

## 2015-12-09 DIAGNOSIS — G4700 Insomnia, unspecified: Secondary | ICD-10-CM

## 2015-12-09 MED ORDER — ZOLPIDEM TARTRATE 5 MG PO TABS: 5.0000 mg | ORAL_TABLET | Freq: Every day | ORAL | 0 refills | Status: DC

## 2016-01-13 ENCOUNTER — Encounter: Payer: BLUE CROSS/BLUE SHIELD | Admitting: Family

## 2016-01-26 ENCOUNTER — Other Ambulatory Visit: Payer: Self-pay | Admitting: Family

## 2016-01-26 DIAGNOSIS — G4700 Insomnia, unspecified: Secondary | ICD-10-CM

## 2016-01-27 ENCOUNTER — Encounter: Payer: Self-pay | Admitting: Family Medicine

## 2016-01-27 ENCOUNTER — Ambulatory Visit (INDEPENDENT_AMBULATORY_CARE_PROVIDER_SITE_OTHER): Payer: 59 | Admitting: Family Medicine

## 2016-01-27 DIAGNOSIS — J0100 Acute maxillary sinusitis, unspecified: Secondary | ICD-10-CM | POA: Diagnosis not present

## 2016-01-27 MED ORDER — DOXYCYCLINE HYCLATE 100 MG PO TABS: 100.0000 mg | ORAL_TABLET | Freq: Two times a day (BID) | ORAL | 0 refills | Status: DC

## 2016-01-27 NOTE — Progress Notes (Signed)
  Tommi Rumps, MD Phone: [phone]  Jennifer Clayton is a 60 y.o. female who presents today for same day visit.  Patient notes sinus congestion and pressure for the last 3 weeks. Right greater than left. Has been blowing green/yellow mucus out of her nose that is occasionally blood tinged. Not much cough. Some sore throat. No ear discomfort. Has tried Mucinex and Tylenol with little benefit.  ROS see history of present illness  Objective  Physical Exam Vitals:   01/27/16 1318  BP: 110/86  Pulse: 78  Temp: 98.5 F (36.9 C)    BP Readings from Last 3 Encounters:  01/27/16 110/86  10/21/15 120/82  10/12/15 128/80   Wt Readings from Last 3 Encounters:  01/27/16 131 lb (59.4 kg)  10/21/15 130 lb 12.8 oz (59.3 kg)  10/12/15 131 lb (59.4 kg)    Physical Exam  Constitutional: No distress.  HENT:  Head: Normocephalic and atraumatic.  Mouth/Throat: Oropharynx is clear and moist. No oropharyngeal exudate.  Normal TMs bilaterally, right maxillary sinus tenderness to percussion and right frontal sinus tenderness to percussion  Eyes: Conjunctivae are normal. Pupils are equal, round, and reactive to light.  Neck: Neck supple.  Cardiovascular: Normal rate, regular rhythm and normal heart sounds.   Pulmonary/Chest: Effort normal and breath sounds normal.  Lymphadenopathy:    She has no cervical adenopathy.  Skin: She is not diaphoretic.     Assessment/Plan: Please see individual problem list.  Sinusitis, acute, maxillary History and exam most consistent with sinusitis. We will treat with doxycycline given her Augmentin allergy. Discussed Claritin and Flonase. Given return precautions.   No orders of the defined types were placed in this encounter.   Meds ordered this encounter  Medications  . doxycycline (VIBRA-TABS) 100 MG tablet    Sig: Take 1 tablet (100 mg total) by mouth 2 (two) times daily.    Dispense:  14 tablet    Refill:  0    Tommi Rumps,  MD [place]

## 2016-01-27 NOTE — Assessment & Plan Note (Signed)
History and exam most consistent with sinusitis. We will treat with doxycycline given her Augmentin allergy. Discussed Claritin and Flonase. Given return precautions.

## 2016-01-27 NOTE — Progress Notes (Signed)
Pre visit review using our clinic review tool, if applicable. No additional management support is needed unless otherwise documented below in the visit note. 

## 2016-01-27 NOTE — Patient Instructions (Signed)
Nice to see you. We will treat you with doxycycline for a sinus infection. You should take a probiotic or eat some yogurt while on this. If you start to cough blood up, have fevers, or any new or changing symptoms please seek medical attention immediately.

## 2016-02-11 ENCOUNTER — Other Ambulatory Visit: Payer: Self-pay | Admitting: Family

## 2016-02-11 NOTE — Telephone Encounter (Signed)
Pt called requesting a refill on this medication. PT is out of medication. Please advise, thank you!  [place], [place]  Call pt @ [phone]

## 2016-02-12 ENCOUNTER — Telehealth: Payer: Self-pay | Admitting: *Deleted

## 2016-02-12 NOTE — Telephone Encounter (Signed)
When I spoke with patient she stated she needed refill on Imitrex not zolpidem.  lov 10/21/15 Follow 2 months pap Scheduled 03/10/16

## 2016-02-12 NOTE — Telephone Encounter (Signed)
Patient has scheduled appt 03/10/2016

## 2016-02-12 NOTE — Telephone Encounter (Signed)
Pt has requested a medication refill for zolpidem today  [facility]

## 2016-02-12 NOTE — Telephone Encounter (Signed)
Refill request for Imitrex, last seen [id number], last filled [id number].  Please advise.

## 2016-02-12 NOTE — Telephone Encounter (Signed)
[facility] has requested to have the sumatriptan refilled

## 2016-03-10 ENCOUNTER — Encounter: Payer: 59 | Admitting: Family

## 2016-03-10 ENCOUNTER — Encounter: Payer: Self-pay | Admitting: Family

## 2016-03-10 ENCOUNTER — Ambulatory Visit (INDEPENDENT_AMBULATORY_CARE_PROVIDER_SITE_OTHER): Payer: 59 | Admitting: Family

## 2016-03-10 ENCOUNTER — Encounter (INDEPENDENT_AMBULATORY_CARE_PROVIDER_SITE_OTHER): Payer: Self-pay

## 2016-03-10 ENCOUNTER — Other Ambulatory Visit: Payer: Self-pay | Admitting: Family

## 2016-03-10 VITALS — BP 142/70 | HR 86 | Temp 97.6°F | Ht 63.0 in | Wt 133.2 lb

## 2016-03-10 DIAGNOSIS — G4700 Insomnia, unspecified: Secondary | ICD-10-CM | POA: Diagnosis not present

## 2016-03-10 DIAGNOSIS — E039 Hypothyroidism, unspecified: Secondary | ICD-10-CM

## 2016-03-10 DIAGNOSIS — Z0000 Encounter for general adult medical examination without abnormal findings: Secondary | ICD-10-CM | POA: Diagnosis not present

## 2016-03-10 LAB — TSH: TSH: 16.25 u[IU]/mL — ABNORMAL HIGH (ref 0.35–4.50)

## 2016-03-10 LAB — T4, FREE: FREE T4: 0.86 ng/dL (ref 0.60–1.60)

## 2016-03-10 MED ORDER — ZOLPIDEM TARTRATE 5 MG PO TABS: 5.0000 mg | ORAL_TABLET | Freq: Every day | ORAL | 0 refills | Status: DC

## 2016-03-10 MED ORDER — LEVOTHYROXINE SODIUM 100 MCG PO TABS: 100.0000 ug | ORAL_TABLET | Freq: Every day | ORAL | 3 refills | Status: DC

## 2016-03-10 NOTE — Patient Instructions (Addendum)
Tdap booster at local pharmacy  Let me know if congestion doesn't get better  Labs today

## 2016-03-10 NOTE — Assessment & Plan Note (Signed)
Clinically asymptomatic. Pending tsh

## 2016-03-10 NOTE — Progress Notes (Signed)
Pre visit review using our clinic review tool, if applicable. No additional management support is needed unless otherwise documented below in the visit note. 

## 2016-03-10 NOTE — Assessment & Plan Note (Signed)
Colonoscopy UTD 01/2016. UTD mammogram. Declines CBE today. No records. Per patient , she is on 5 year schedule and I have noted on chart. UTD mammogram. No pap due to h/o hysterectomy. No pelvic complaints today; declines pelvic based on age and preference. Due for tdap; advised local pharmacy due to cost. Labs done prior. Congratulated patient on regular exercise.

## 2016-03-10 NOTE — Progress Notes (Signed)
Subjective:    Patient ID: Jennifer Clayton, female    DOB: March 16, 1955, 61 y.o.   MRN: [id number]  CC: Jennifer Clayton is a 61 y.o. female who presents today for physical exam.    HPI:  5 days sore throat, runny nose, unchanged. Congestion is getting thick. Started mucinex today. Worried cough is coming back. No fever, SOB.   Doing well on ambien as needed. No side effects. Would like refill today.   No palpitations, cold/heat intolerance.     Colorectal Cancer Screening: 01/14/2016, Dr Bary Castilla Breast Cancer Screening: Mammogram UTD Cervical Cancer Screening: hysterectomy; vaginal bleeding. No gyn.  Bone Health screening/DEXA for 65+: No increased fracture risk. Defer screening at this time. Lung Cancer Screening: Doesn't have 30 year pack year history and age > 61 years.       Tetanus - due        Pneumococcal - not Candidate for.  Labs: done prior Exercise: Gets regular exercise.  Alcohol use: occasional Smoking/tobacco use: Nonsmoker.  Regular dental exams: In need of dental exam Wears seat belt: Yes. Skin: h/o skin cancer; follows with Dr Pennelope Bracken   HISTORY:  Past Medical History:  Diagnosis Date  . Cancer ([place])    skin cancer (left leg)  . H/O radioactive iodine thyroid ablation     Past Surgical History:  Procedure Laterality Date  . ABDOMINAL HYSTERECTOMY     Age 30, for bleeding and cyst  . VAGINAL DELIVERY     Family History  Problem Relation Age of Onset  . Cancer Mother     lung, non-smoker  . Dementia Father     related to traumatic brain injury  . Breast cancer Neg Hx       ALLERGIES: Zomepirac; Amoxicillin-pot clavulanate; and Other  Current Outpatient Prescriptions on File Prior to Visit  Medication Sig Dispense Refill  . doxycycline (VIBRA-TABS) 100 MG tablet Take 1 tablet (100 mg total) by mouth 2 (two) times daily. 14 tablet 0  . levothyroxine (SYNTHROID, LEVOTHROID) 88 MCG tablet Take 1 tablet (88 mcg total) by mouth daily  before breakfast. 90 tablet 1  . SUMAtriptan (IMITREX) 50 MG tablet TAKE 1 TABLET EVERY 2 HOURS AS NEEDED FOR MIRGRAINE MAY REPEAT IN 2 HOURS IF HEADACHE PERSISTS OR RECURS 15 tablet 1   No current facility-administered medications on file prior to visit.     Social History  Substance Use Topics  . Smoking status: Never Smoker  . Smokeless tobacco: Never Used  . Alcohol use 0.6 oz/week    1 Glasses of wine per week    Review of Systems  Constitutional: Negative for chills, fever and unexpected weight change.  HENT: Positive for congestion and rhinorrhea. Negative for sinus pressure.   Respiratory: Negative for cough, shortness of breath and wheezing.   Cardiovascular: Negative for chest pain, palpitations and leg swelling.  Gastrointestinal: Negative for nausea and vomiting.  Endocrine: Negative for cold intolerance and heat intolerance.  Musculoskeletal: Negative for arthralgias and myalgias.  Skin: Negative for rash.  Neurological: Negative for headaches.  Hematological: Negative for adenopathy.  Psychiatric/Behavioral: Negative for confusion.      Objective:    BP (!) 142/70   Pulse 86   Temp 97.6 F (36.4 C) (Oral)   Ht 5\' 3"  (1.6 m)   Wt 133 lb 3.2 oz (60.4 kg)   SpO2 99%   BMI 23.60 kg/m   BP Readings from Last 3 Encounters:  03/10/16 (!) 142/70  01/27/16 110/86  10/21/15 120/82   Wt Readings from Last 3 Encounters:  03/10/16 133 lb 3.2 oz (60.4 kg)  01/27/16 131 lb (59.4 kg)  10/21/15 130 lb 12.8 oz (59.3 kg)    Physical Exam  Constitutional: She appears well-developed and well-nourished.  Eyes: Conjunctivae are normal.  Neck: No thyroid mass and no thyromegaly present.  Cardiovascular: Normal rate, regular rhythm, normal heart sounds and normal pulses.   Pulmonary/Chest: Effort normal and breath sounds normal. She has no wheezes. She has no rhonchi. She has no rales.  Lymphadenopathy:       Head (right side): No submental, no submandibular, no  tonsillar, no preauricular, no posterior auricular and no occipital adenopathy present.       Head (left side): No submental, no submandibular, no tonsillar, no preauricular, no posterior auricular and no occipital adenopathy present.    She has no cervical adenopathy.  Neurological: She is alert.  Skin: Skin is warm and dry.  Psychiatric: She has a normal mood and affect. Her speech is normal and behavior is normal. Thought content normal.  Vitals reviewed.      Assessment & Plan:   Problem List Items Addressed This Visit      Endocrine   Hypothyroidism - Primary    Clinically asymptomatic. Pending tsh      Relevant Orders   TSH   T4, free     Other   Insomnia    Stable. Will continue ambien      Relevant Medications   zolpidem (AMBIEN) 5 MG tablet   Routine physical examination    Colonoscopy UTD 01/2016. UTD mammogram. Declines CBE today. No records. Per patient , she is on 5 year schedule and I have noted on chart. UTD mammogram. No pap due to h/o hysterectomy. No pelvic complaints today; declines pelvic based on age and preference. Due for tdap; advised local pharmacy due to cost. Labs done prior. Congratulated patient on regular exercise.           I am having Ms. Labus maintain her levothyroxine, doxycycline, SUMAtriptan, and zolpidem.   Meds ordered this encounter  Medications  . zolpidem (AMBIEN) 5 MG tablet    Sig: Take 1 tablet (5 mg total) by mouth at bedtime.    Dispense:  30 tablet    Refill:  0    Order Specific Question:   Supervising Provider    Answer:   Crecencio Mc [[id number]]    Return precautions given.   Risks, benefits, and alternatives of the medications and treatment plan prescribed today were discussed, and patient expressed understanding.   Education regarding symptom management and diagnosis given to patient on AVS.   Continue to follow with Mable Paris, FNP for routine health maintenance.   Lyn Henri Whack and I agreed with  plan.   Mable Paris, FNP

## 2016-03-10 NOTE — Assessment & Plan Note (Signed)
Stable. Will continue [place]

## 2016-04-14 ENCOUNTER — Other Ambulatory Visit: Payer: Self-pay | Admitting: Family

## 2016-04-14 DIAGNOSIS — G4700 Insomnia, unspecified: Secondary | ICD-10-CM

## 2016-05-18 ENCOUNTER — Other Ambulatory Visit: Payer: Self-pay | Admitting: Family

## 2016-05-18 DIAGNOSIS — G4700 Insomnia, unspecified: Secondary | ICD-10-CM

## 2016-05-20 NOTE — Telephone Encounter (Signed)
Refill request for [facility], last seen [id number], last filled [id number].  Please advise.

## 2016-05-23 MED ORDER — ZOLPIDEM TARTRATE 5 MG PO TABS: 5.0000 mg | ORAL_TABLET | Freq: Every day | ORAL | 1 refills | Status: DC

## 2016-06-06 ENCOUNTER — Other Ambulatory Visit: Payer: Self-pay | Admitting: Family

## 2016-06-17 ENCOUNTER — Encounter: Payer: Self-pay | Admitting: Family

## 2016-06-17 MED ORDER — SUMATRIPTAN SUCCINATE 50 MG PO TABS: ORAL_TABLET | ORAL | 1 refills | Status: DC

## 2016-06-17 NOTE — Telephone Encounter (Signed)
Please advise, thank you.

## 2016-06-30 ENCOUNTER — Other Ambulatory Visit: Payer: Self-pay | Admitting: Family

## 2016-08-17 ENCOUNTER — Other Ambulatory Visit (INDEPENDENT_AMBULATORY_CARE_PROVIDER_SITE_OTHER): Payer: 59

## 2016-08-17 DIAGNOSIS — E039 Hypothyroidism, unspecified: Secondary | ICD-10-CM

## 2016-08-17 LAB — T4, FREE: Free T4: 1.59 ng/dL (ref 0.60–1.60)

## 2016-08-17 LAB — TSH: TSH: 0.01 u[IU]/mL — ABNORMAL LOW (ref 0.35–4.50)

## 2016-08-18 ENCOUNTER — Other Ambulatory Visit: Payer: Self-pay | Admitting: Family

## 2016-08-18 DIAGNOSIS — E039 Hypothyroidism, unspecified: Secondary | ICD-10-CM

## 2016-11-30 ENCOUNTER — Other Ambulatory Visit: Payer: Self-pay | Admitting: Family

## 2016-11-30 DIAGNOSIS — G4700 Insomnia, unspecified: Secondary | ICD-10-CM

## 2016-11-30 NOTE — Telephone Encounter (Signed)
Last script 05/23/16 #30 1 RF  Last o/v 03/10/16 F/u n/a no app

## 2016-12-01 ENCOUNTER — Telehealth: Payer: Self-pay | Admitting: Family

## 2016-12-01 DIAGNOSIS — G4700 Insomnia, unspecified: Secondary | ICD-10-CM

## 2016-12-01 MED ORDER — ZOLPIDEM TARTRATE 5 MG PO TABS: 5.0000 mg | ORAL_TABLET | Freq: Every day | ORAL | 1 refills | Status: DC

## 2016-12-01 NOTE — Telephone Encounter (Signed)
I looked up patient on [place] Controlled Substances Reporting System and saw no activity that raised concern of inappropriate use.   refilled 

## 2017-01-12 ENCOUNTER — Other Ambulatory Visit: Payer: Self-pay | Admitting: Family

## 2017-01-12 DIAGNOSIS — Z1231 Encounter for screening mammogram for malignant neoplasm of breast: Secondary | ICD-10-CM

## 2017-02-15 ENCOUNTER — Ambulatory Visit
Admission: RE | Admit: 2017-02-15 | Discharge: 2017-02-15 | Disposition: A | Discharge status: Home / Self Care | Payer: 59 | Source: Ambulatory Visit | Attending: Family | Admitting: Family

## 2017-02-15 DIAGNOSIS — Z1231 Encounter for screening mammogram for malignant neoplasm of breast: Secondary | ICD-10-CM | POA: Insufficient documentation

## 2017-03-22 ENCOUNTER — Other Ambulatory Visit: Payer: Self-pay | Admitting: Family

## 2017-03-22 DIAGNOSIS — E039 Hypothyroidism, unspecified: Secondary | ICD-10-CM

## 2017-04-21 DIAGNOSIS — D1800 Hemangioma unspecified site: Secondary | ICD-10-CM | POA: Diagnosis not present

## 2017-04-21 DIAGNOSIS — D229 Melanocytic nevi, unspecified: Secondary | ICD-10-CM | POA: Diagnosis not present

## 2017-04-21 DIAGNOSIS — L570 Actinic keratosis: Secondary | ICD-10-CM | POA: Diagnosis not present

## 2017-04-21 DIAGNOSIS — Z85828 Personal history of other malignant neoplasm of skin: Secondary | ICD-10-CM | POA: Diagnosis not present

## 2017-05-02 ENCOUNTER — Other Ambulatory Visit: Payer: Self-pay | Admitting: Family

## 2017-05-02 DIAGNOSIS — G4700 Insomnia, unspecified: Secondary | ICD-10-CM

## 2017-05-03 NOTE — Telephone Encounter (Signed)
Last filled 02/20/17 Last office visit 03/10/16 No office visit scheduled

## 2017-05-03 NOTE — Telephone Encounter (Signed)
Call pt  She needs to be seen every 4-6 months on a controlled substance  I have refilled ambien  Please make her an appt  I looked up patient on [place] Controlled Substances Reporting System and saw no activity that raised concern of inappropriate use.

## 2017-05-18 NOTE — Telephone Encounter (Signed)
Mychart message sent.

## 2017-05-23 DIAGNOSIS — M7050 Other bursitis of knee, unspecified knee: Secondary | ICD-10-CM | POA: Insufficient documentation

## 2017-05-23 DIAGNOSIS — M7051 Other bursitis of knee, right knee: Secondary | ICD-10-CM | POA: Diagnosis not present

## 2017-05-23 DIAGNOSIS — M7052 Other bursitis of knee, left knee: Secondary | ICD-10-CM | POA: Diagnosis not present

## 2017-05-23 NOTE — Telephone Encounter (Signed)
Appointment scheduled.

## 2017-06-08 DIAGNOSIS — L814 Other melanin hyperpigmentation: Secondary | ICD-10-CM | POA: Diagnosis not present

## 2017-06-08 DIAGNOSIS — L570 Actinic keratosis: Secondary | ICD-10-CM | POA: Diagnosis not present

## 2017-06-22 ENCOUNTER — Other Ambulatory Visit: Payer: Self-pay | Admitting: Family

## 2017-06-23 ENCOUNTER — Ambulatory Visit: Payer: 59 | Admitting: Family

## 2017-06-23 ENCOUNTER — Encounter: Payer: Self-pay | Admitting: Family

## 2017-06-23 VITALS — BP 140/82 | HR 69 | Temp 98.2°F | Resp 16 | Wt 133.1 lb

## 2017-06-23 DIAGNOSIS — G4700 Insomnia, unspecified: Secondary | ICD-10-CM

## 2017-06-23 DIAGNOSIS — R635 Abnormal weight gain: Secondary | ICD-10-CM | POA: Diagnosis not present

## 2017-06-23 DIAGNOSIS — Z23 Encounter for immunization: Secondary | ICD-10-CM | POA: Diagnosis not present

## 2017-06-23 DIAGNOSIS — G43919 Migraine, unspecified, intractable, without status migrainosus: Secondary | ICD-10-CM | POA: Diagnosis not present

## 2017-06-23 DIAGNOSIS — E039 Hypothyroidism, unspecified: Secondary | ICD-10-CM | POA: Diagnosis not present

## 2017-06-23 LAB — T4, FREE: FREE T4: 1.54 ng/dL (ref 0.60–1.60)

## 2017-06-23 LAB — TSH: TSH: 0.01 u[IU]/mL — ABNORMAL LOW (ref 0.35–4.50)

## 2017-06-23 LAB — T3, FREE: T3, Free: 3.4 pg/mL (ref 2.3–4.2)

## 2017-06-23 MED ORDER — ZOLPIDEM TARTRATE 5 MG PO TABS: 5.0000 mg | ORAL_TABLET | Freq: Every day | ORAL | 1 refills | Status: DC

## 2017-06-23 MED ORDER — SUMATRIPTAN SUCCINATE 50 MG PO TABS: ORAL_TABLET | ORAL | 0 refills | Status: DC

## 2017-06-23 MED ORDER — BUPROPION HCL ER (XL) 150 MG PO TB24: ORAL_TABLET | ORAL | 1 refills | Status: DC

## 2017-06-23 NOTE — Assessment & Plan Note (Signed)
Unchanged.  Refilled Ambien as tolerating medication well.  Offered medications such as Zoloft to assist with underlying anxiety, patient politely declines.  Will follow

## 2017-06-23 NOTE — Assessment & Plan Note (Signed)
Pending thyroid studies 

## 2017-06-23 NOTE — Patient Instructions (Addendum)
Thyroid labs.   Let me know how you are doing on wellbutrin  This is  Dr. Lupita Dawn  example of a  "Low GI"  Diet:  It will allow you to lose 4 to 8  lbs  per month if you follow it carefully.  Your goal with exercise is a minimum of 30 minutes of aerobic exercise 5 days per week (Walking does not count once it becomes easy!)    All of the foods can be found at grocery stores and in bulk at [employer].  The Atkins protein bars and shakes are available in more varieties at [employer], [employer] and [place].     7 AM Breakfast:  Choose from the following:  Low carbohydrate Protein  Shakes (I recommend the  Premier Protein chocolate shakes,  EAS AdvantEdge "Carb Control" shakes  Or the Atkins shakes all are under 3 net carbs)     a scrambled egg/bacon/cheese burrito made with Mission's "carb balance" whole wheat tortilla  (about 10 net carbs )  Regulatory affairs officer (basically a quiche without the pastry crust) that is eaten cold and very convenient way to get your eggs.  8 carbs)  If you make your own protein shakes, avoid bananas and pineapple,  And use low carb greek yogurt or original /unsweetened almond or soy milk    Avoid cereal and bananas, oatmeal and cream of wheat and grits. They are loaded with carbohydrates!   10 AM: high protein snack:  Protein bar by Atkins (the snack size, under 200 cal, usually < 6 net carbs).    A stick of cheese:  Around 1 carb,  100 cal     Dannon Light n Fit [place] Yogurt  (80 cal, 8 carbs)  Other so called "protein bars" and Greek yogurts tend to be loaded with carbohydrates.  Remember, in food advertising, the word "energy" is synonymous for " carbohydrate."  Lunch:   A Sandwich using the bread choices listed, Can use any  Eggs,  lunchmeat, grilled meat or canned tuna), avocado, regular mayo/mustard  and cheese.  A Salad using blue cheese, ranch,  Goddess or vinagrette,  Avoid taco shells, croutons or "confetti" and no "candied nuts" but  regular nuts OK.   No pretzels, nabs  or chips.  Pickles and miniature sweet peppers are a good low carb alternative that provide a "crunch"  The bread is the only source of carbohydrate in a sandwich and  can be decreased by trying some of the attached alternatives to traditional loaf bread   Avoid "Low fat dressings, as well as [place] dressings They are loaded with sugar!   3 PM/ Mid day  Snack:  Consider  1 ounce of  almonds, walnuts, pistachios, pecans, peanuts,  Macadamia nuts or a nut medley.  Avoid "granola and granola bars "  Mixed nuts are ok in moderation as long as there are no raisins,  cranberries or dried fruit.   KIND bars are OK if you get the low glycemic index variety   Try the prosciutto/mozzarella cheese sticks by [employer]  In deli /backery section   High protein      6 PM  Dinner:     Meat/fowl/fish with a green salad, and either broccoli, cauliflower, green beans, spinach, brussel sprouts or  Lima beans. DO NOT BREAD THE PROTEIN!!      There is a low carb pasta by Dreamfield's that is acceptable and tastes great: only 5 digestible  carbs/serving.( All grocery stores but BJs carry it ) Several ready made meals are available low carb:   Try Michel Angelo's chicken piccata or chicken or eggplant parm over low carb pasta.(Lowes and BJs)   Marjory Lies Sanchez's "Carnitas" (pulled pork, no sauce,  0 carbs) or his beef pot roast to make a dinner burrito (at [employer]J's)  Pesto over low carb pasta (bj's sells a good quality pesto in the center refrigerated section of the deli   Try satueeing  Cheral Marker with mushroooms as a good side   [employer] makes a mashed cauliflower that tastes like mashed potatoes  Whole wheat pasta is still full of digestible carbs and  Not as low in glycemic index as Dreamfield's.   Brown rice is still rice,  So skip the rice and noodles if you eat [place] or [place] (or at least limit to 1/2 cup)  9 PM snack :   Breyer's "low carb" fudgsicle  or  ice cream bar (Carb Smart line), or  Weight Watcher's ice cream bar , or another "no sugar added" ice cream;  a serving of fresh berries/cherries with whipped cream   Cheese or DANNON'S LlGHT N FIT GREEK YOGURT  8 ounces of [place] unsweetened almond/cococunut milk    Treat yourself to a parfait made with whipped cream blueberiies, walnuts and vanilla greek yogurt  Avoid bananas, pineapple, grapes  and watermelon on a regular basis because they are high in sugar.  THINK OF THEM AS DESSERT  Remember that snack Substitutions should be less than 10 NET carbs per serving and meals < 20 carbs. Remember to subtract fiber grams to get the "net carbs."  @TULLOBREADPACKAGE @

## 2017-06-23 NOTE — Assessment & Plan Note (Addendum)
Frustrated by inability to lose 5 to 7 pounds.  We will do a trial of Wellbutrin as patient is made by lifestyle modifications at home.  Patient will let me know how she is doing.

## 2017-06-23 NOTE — Progress Notes (Signed)
Subjective:    Patient ID: Jennifer Clayton, female    DOB: 1955-04-27, 62 y.o.   MRN: [id number]  CC: Jennifer Clayton is a 62 y.o. female who presents today for follow up.   HPI: Migraine- needs refill of imitrex, takes one quarter of imitrex. Feels similar to HA's in the past.   Insomnia- still has some poor sleepness nights. Takes [place] as needed. No side effects. Thinks mind is racing. No depression  Frustrated by weight. exercising 5 times per week and eating well. Not sure why weight is not coming off.  No h/o seizure or heavy alcohol use. One alcoholic drink per month  Total Hysterectomy- no longer doing pap smear     HISTORY:  Past Medical History:  Diagnosis Date  . Cancer ([place])    skin cancer (left leg)  . H/O radioactive iodine thyroid ablation    Past Surgical History:  Procedure Laterality Date  . ABDOMINAL HYSTERECTOMY     Age 71, for bleeding and cyst  . VAGINAL DELIVERY     Family History  Problem Relation Age of Onset  . Cancer Mother        lung, non-smoker  . Dementia Father        related to traumatic brain injury  . Breast cancer Neg Hx     Allergies: Zomepirac; Amoxicillin-pot clavulanate; and Other Current Outpatient Medications on File Prior to Visit  Medication Sig Dispense Refill  . levothyroxine (SYNTHROID, LEVOTHROID) 100 MCG tablet TAKE ONE TABLET EVERY DAY 90 tablet 3  . Multiple Vitamins-Minerals (MULTIVITAMIN ADULT PO) Take 1 tablet by mouth daily.     No current facility-administered medications on file prior to visit.     Social History   Tobacco Use  . Smoking status: Never Smoker  . Smokeless tobacco: Never Used  Substance Use Topics  . Alcohol use: Yes    Alcohol/week: 0.6 oz    Types: 1 Glasses of wine per week  . Drug use: Not on file    Review of Systems  Constitutional: Negative for activity change, appetite change, chills and fever.  Respiratory: Negative for cough.   Cardiovascular: Negative for chest  pain and palpitations.  Gastrointestinal: Negative for nausea and vomiting.  Neurological: Positive for headaches (at baseline).  Psychiatric/Behavioral: Positive for sleep disturbance.      Objective:    BP 140/82 (BP Location: Left Arm, Patient Position: Sitting, Cuff Size: Normal)   Pulse 69   Temp 98.2 F (36.8 C) (Oral)   Resp 16   Wt 133 lb 2 oz (60.4 kg)   SpO2 99%   BMI 23.58 kg/m  BP Readings from Last 3 Encounters:  06/23/17 140/82  03/10/16 (!) 142/70  01/27/16 110/86   Wt Readings from Last 3 Encounters:  06/23/17 133 lb 2 oz (60.4 kg)  03/10/16 133 lb 3.2 oz (60.4 kg)  01/27/16 131 lb (59.4 kg)    Physical Exam  Constitutional: She appears well-developed and well-nourished.  Eyes: Conjunctivae are normal.  Cardiovascular: Normal rate, regular rhythm, normal heart sounds and normal pulses.  Pulmonary/Chest: Effort normal and breath sounds normal. She has no wheezes. She has no rhonchi. She has no rales.  Neurological: She is alert.  Skin: Skin is warm and dry.  Psychiatric: She has a normal mood and affect. Her speech is normal and behavior is normal. Thought content normal.  Vitals reviewed.      Assessment & Plan:   Problem List Items Addressed This Visit  Cardiovascular and Mediastinum   Migraine    At baseline.   Refill Imitrex.  Patient education given to let me know if headache were to change from baseline, or to increase in frequency or severity.  Patient verbalized understanding      Relevant Medications   SUMAtriptan (IMITREX) 50 MG tablet   buPROPion (WELLBUTRIN XL) 150 MG 24 hr tablet     Endocrine   Hypothyroidism    Pending thyroid studies.      Relevant Orders   TSH   T3, free   T4, free     Other   Insomnia - Primary    Unchanged.  Refilled Ambien as tolerating medication well.  Offered medications such as Zoloft to assist with underlying anxiety, patient politely declines.  Will follow      Relevant Medications    zolpidem (AMBIEN) 5 MG tablet   Weight gain    Frustrated by inability to lose 5 to 7 pounds.  We will do a trial of Wellbutrin as patient is made by lifestyle modifications at home.  Patient will let me know how she is doing.      Relevant Medications   buPROPion (WELLBUTRIN XL) 150 MG 24 hr tablet    Other Visit Diagnoses    Need for diphtheria-tetanus-pertussis (Tdap) vaccine       Relevant Orders   Tdap vaccine greater than or equal to 7yo IM (Completed)       I have discontinued Dolly I. Gato's doxycycline. I have also changed her zolpidem. Additionally, I am having her start on buPROPion. Lastly, I am having her maintain her levothyroxine, Multiple Vitamins-Minerals (MULTIVITAMIN ADULT PO), and SUMAtriptan.   Meds ordered this encounter  Medications  . SUMAtriptan (IMITREX) 50 MG tablet    Sig: May repeat in 2 hours if headache persists or recurs.    Dispense:  15 tablet    Refill:  0    Order Specific Question:   Supervising Provider    Answer:   Deborra Medina L [[id number]]  . zolpidem (AMBIEN) 5 MG tablet    Sig: Take 1 tablet (5 mg total) by mouth at bedtime.    Dispense:  30 tablet    Refill:  1    Order Specific Question:   Supervising Provider    Answer:   Deborra Medina L [[id number]]  . buPROPion (WELLBUTRIN XL) 150 MG 24 hr tablet    Sig: Take one tablet by mouth every morning for 7 days, and then increase to two tablets by mouth every morning.    Dispense:  120 tablet    Refill:  1    Order Specific Question:   Supervising Provider    Answer:   Crecencio Mc [[id number]]    Return precautions given.   Risks, benefits, and alternatives of the medications and treatment plan prescribed today were discussed, and patient expressed understanding.   Education regarding symptom management and diagnosis given to patient on AVS.  Continue to follow with Burnard Hawthorne, FNP for routine health maintenance.   Lyn Henri Hokenson and I agreed with plan.   Jennifer Paris,  FNP

## 2017-06-23 NOTE — Assessment & Plan Note (Signed)
At baseline.   Refill Imitrex.  Patient education given to let me know if headache were to change from baseline, or to increase in frequency or severity.  Patient verbalized understanding

## 2017-07-05 DIAGNOSIS — L570 Actinic keratosis: Secondary | ICD-10-CM | POA: Diagnosis not present

## 2017-07-05 DIAGNOSIS — L821 Other seborrheic keratosis: Secondary | ICD-10-CM | POA: Diagnosis not present

## 2017-12-24 ENCOUNTER — Other Ambulatory Visit: Payer: Self-pay | Admitting: Family

## 2017-12-24 DIAGNOSIS — G4700 Insomnia, unspecified: Secondary | ICD-10-CM

## 2017-12-25 ENCOUNTER — Encounter: Payer: Self-pay | Admitting: Family

## 2017-12-25 NOTE — Telephone Encounter (Signed)
I looked up patient on [place] Controlled Substances Reporting System and saw no activity that raised concern of inappropriate use.   

## 2018-01-15 DIAGNOSIS — M545 Low back pain, unspecified: Secondary | ICD-10-CM | POA: Insufficient documentation

## 2018-02-02 ENCOUNTER — Other Ambulatory Visit: Payer: Self-pay | Admitting: Family

## 2018-02-02 DIAGNOSIS — Z1231 Encounter for screening mammogram for malignant neoplasm of breast: Secondary | ICD-10-CM

## 2018-02-12 DIAGNOSIS — M4856XA Collapsed vertebra, not elsewhere classified, lumbar region, initial encounter for fracture: Secondary | ICD-10-CM | POA: Insufficient documentation

## 2018-02-15 ENCOUNTER — Other Ambulatory Visit: Payer: Self-pay | Admitting: Family

## 2018-02-15 DIAGNOSIS — M4856XA Collapsed vertebra, not elsewhere classified, lumbar region, initial encounter for fracture: Secondary | ICD-10-CM | POA: Diagnosis not present

## 2018-02-15 DIAGNOSIS — G43919 Migraine, unspecified, intractable, without status migrainosus: Secondary | ICD-10-CM

## 2018-02-22 ENCOUNTER — Ambulatory Visit
Admission: RE | Admit: 2018-02-22 | Discharge: 2018-02-22 | Disposition: A | Discharge status: Home / Self Care | Payer: 59 | Source: Ambulatory Visit | Attending: Family | Admitting: Family

## 2018-02-22 DIAGNOSIS — Z1231 Encounter for screening mammogram for malignant neoplasm of breast: Secondary | ICD-10-CM | POA: Diagnosis not present

## 2018-03-07 ENCOUNTER — Ambulatory Visit (INDEPENDENT_AMBULATORY_CARE_PROVIDER_SITE_OTHER): Payer: PPO | Admitting: Family

## 2018-03-07 ENCOUNTER — Encounter: Payer: Self-pay | Admitting: Family

## 2018-03-07 VITALS — BP 140/60 | HR 78 | Temp 97.7°F | Resp 16 | Ht 63.0 in | Wt 133.1 lb

## 2018-03-07 DIAGNOSIS — Z0000 Encounter for general adult medical examination without abnormal findings: Secondary | ICD-10-CM

## 2018-03-07 DIAGNOSIS — G4700 Insomnia, unspecified: Secondary | ICD-10-CM

## 2018-03-07 DIAGNOSIS — G43809 Other migraine, not intractable, without status migrainosus: Secondary | ICD-10-CM | POA: Diagnosis not present

## 2018-03-07 LAB — CBC WITH DIFFERENTIAL/PLATELET
BASOS PCT: 0.9 % (ref 0.0–3.0)
Basophils Absolute: 0 10*3/uL (ref 0.0–0.1)
EOS ABS: 0.1 10*3/uL (ref 0.0–0.7)
Eosinophils Relative: 2.7 % (ref 0.0–5.0)
HEMATOCRIT: 40.9 % (ref 36.0–46.0)
Hemoglobin: 13.7 g/dL (ref 12.0–15.0)
LYMPHS ABS: 2 10*3/uL (ref 0.7–4.0)
LYMPHS PCT: 38.6 % (ref 12.0–46.0)
MCHC: 33.4 g/dL (ref 30.0–36.0)
MCV: 91.2 fl (ref 78.0–100.0)
MONO ABS: 0.5 10*3/uL (ref 0.1–1.0)
Monocytes Relative: 9.2 % (ref 3.0–12.0)
NEUTROS ABS: 2.5 10*3/uL (ref 1.4–7.7)
Neutrophils Relative %: 48.6 % (ref 43.0–77.0)
PLATELETS: 300 10*3/uL (ref 150.0–400.0)
RBC: 4.49 Mil/uL (ref 3.87–5.11)
RDW: 12.6 % (ref 11.5–15.5)
WBC: 5.1 10*3/uL (ref 4.0–10.5)

## 2018-03-07 LAB — COMPREHENSIVE METABOLIC PANEL
ALT: 12 U/L (ref 0–35)
AST: 16 U/L (ref 0–37)
Albumin: 4.2 g/dL (ref 3.5–5.2)
Alkaline Phosphatase: 78 U/L (ref 39–117)
BUN: 8 mg/dL (ref 6–23)
CALCIUM: 9.6 mg/dL (ref 8.4–10.5)
CHLORIDE: 101 meq/L (ref 96–112)
CO2: 29 meq/L (ref 19–32)
CREATININE: 0.78 mg/dL (ref 0.40–1.20)
GFR: 74.13 mL/min (ref >60.00)
Glucose, Bld: 85 mg/dL (ref 70–99)
Potassium: 4.9 mEq/L (ref 3.5–5.1)
Sodium: 138 mEq/L (ref 135–145)
Total Bilirubin: 0.6 mg/dL (ref 0.2–1.2)
Total Protein: 6.9 g/dL (ref 6.0–8.3)

## 2018-03-07 LAB — LIPID PANEL
CHOLESTEROL: 186 mg/dL (ref 0–200)
HDL: 52.9 mg/dL (ref >39.00)
LDL CALC: 112 mg/dL — AB (ref 0–99)
NonHDL: 133.14
TRIGLYCERIDES: 104 mg/dL (ref 0.0–149.0)
Total CHOL/HDL Ratio: 4
VLDL: 20.8 mg/dL (ref 0.0–40.0)

## 2018-03-07 LAB — VITAMIN D 25 HYDROXY (VIT D DEFICIENCY, FRACTURES): VITD: 36.29 ng/mL (ref 30.00–100.00)

## 2018-03-07 LAB — HEMOGLOBIN A1C: Hgb A1c MFr Bld: 5.8 % (ref 4.6–6.5)

## 2018-03-07 LAB — TSH: TSH: 0.04 u[IU]/mL — ABNORMAL LOW (ref 0.35–4.50)

## 2018-03-07 NOTE — Assessment & Plan Note (Signed)
Doing well on ambien. Will continue 

## 2018-03-07 NOTE — Patient Instructions (Signed)
Please call call and schedule your  bone density scan as discussed.   [place]  [place], [place]   Please stay vigilant in regards to headache- if any new symptoms, vision or facial symptoms ( stroke like features as we discussed),  Please stop imitrex and let me know   Health Maintenance for Postmenopausal Women Menopause is a normal process in which your reproductive ability comes to an end. This process happens gradually over a span of months to years, usually between the ages of 57 and 47. Menopause is complete when you have missed 12 consecutive menstrual periods. It is important to talk with your health care provider about some of the most common conditions that affect postmenopausal women, such as heart disease, cancer, and bone loss (osteoporosis). Adopting a healthy lifestyle and getting preventive care can help to promote your health and wellness. Those actions can also lower your chances of developing some of these common conditions. What should I know about menopause? During menopause, you may experience a number of symptoms, such as:  Moderate-to-severe hot flashes.  Night sweats.  Decrease in sex drive.  Mood swings.  Headaches.  Tiredness.  Irritability.  Memory problems.  Insomnia. Choosing to treat or not to treat menopausal changes is an individual decision that you make with your health care provider. What should I know about hormone replacement therapy and supplements? Hormone therapy products are effective for treating symptoms that are associated with menopause, such as hot flashes and night sweats. Hormone replacement carries certain risks, especially as you become older. If you are thinking about using estrogen or estrogen with progestin treatments, discuss the benefits and risks with your health care provider. What should I know about heart disease and stroke? Heart disease, heart attack, and  stroke become more likely as you age. This may be due, in part, to the hormonal changes that your body experiences during menopause. These can affect how your body processes dietary fats, triglycerides, and cholesterol. Heart attack and stroke are both medical emergencies. There are many things that you can do to help prevent heart disease and stroke:  Have your blood pressure checked at least every 1-2 years. High blood pressure causes heart disease and increases the risk of stroke.  If you are 53-32 years old, ask your health care provider if you should take aspirin to prevent a heart attack or a stroke.  Do not use any tobacco products, including cigarettes, chewing tobacco, or electronic cigarettes. If you need help quitting, ask your health care provider.  It is important to eat a healthy diet and maintain a healthy weight. ? Be sure to include plenty of vegetables, fruits, low-fat dairy products, and lean protein. ? Avoid eating foods that are high in solid fats, added sugars, or salt (sodium).  Get regular exercise. This is one of the most important things that you can do for your health. ? Try to exercise for at least 150 minutes each week. The type of exercise that you do should increase your heart rate and make you sweat. This is known as moderate-intensity exercise. ? Try to do strengthening exercises at least twice each week. Do these in addition to the moderate-intensity exercise.  Know your numbers.Ask your health care provider to check your cholesterol and your blood glucose. Continue to have your blood tested as directed by your health care provider.  What should I know about cancer screening? There are several types of cancer.  Take the following steps to reduce your risk and to catch any cancer development as early as possible. Breast Cancer  Practice breast self-awareness. ? This means understanding how your breasts normally appear and feel. ? It also means doing regular  breast self-exams. Let your health care provider know about any changes, no matter how small.  If you are 30 or older, have a clinician do a breast exam (clinical breast exam or CBE) every year. Depending on your age, family history, and medical history, it may be recommended that you also have a yearly breast X-ray (mammogram).  If you have a family history of breast cancer, talk with your health care provider about genetic screening.  If you are at high risk for breast cancer, talk with your health care provider about having an MRI and a mammogram every year.  Breast cancer (BRCA) gene test is recommended for women who have family members with BRCA-related cancers. Results of the assessment will determine the need for genetic counseling and BRCA1 and for BRCA2 testing. BRCA-related cancers include these types: ? Breast. This occurs in males or females. ? Ovarian. ? Tubal. This may also be called fallopian tube cancer. ? Cancer of the abdominal or pelvic lining (peritoneal cancer). ? Prostate. ? Pancreatic. Cervical, Uterine, and Ovarian Cancer Your health care provider may recommend that you be screened regularly for cancer of the pelvic organs. These include your ovaries, uterus, and vagina. This screening involves a pelvic exam, which includes checking for microscopic changes to the surface of your cervix (Pap test).  For women ages 21-65, health care providers may recommend a pelvic exam and a Pap test every three years. For women ages 49-65, they may recommend the Pap test and pelvic exam, combined with testing for human papilloma virus (HPV), every five years. Some types of HPV increase your risk of cervical cancer. Testing for HPV may also be done on women of any age who have unclear Pap test results.  Other health care providers may not recommend any screening for nonpregnant women who are considered low risk for pelvic cancer and have no symptoms. Ask your health care provider if a  screening pelvic exam is right for you.  If you have had past treatment for cervical cancer or a condition that could lead to cancer, you need Pap tests and screening for cancer for at least 20 years after your treatment. If Pap tests have been discontinued for you, your risk factors (such as having a new sexual partner) need to be reassessed to determine if you should start having screenings again. Some women have medical problems that increase the chance of getting cervical cancer. In these cases, your health care provider may recommend that you have screening and Pap tests more often.  If you have a family history of uterine cancer or ovarian cancer, talk with your health care provider about genetic screening.  If you have vaginal bleeding after reaching menopause, tell your health care provider.  There are currently no reliable tests available to screen for ovarian cancer. Lung Cancer Lung cancer screening is recommended for adults 27-15 years old who are at high risk for lung cancer because of a history of smoking. A yearly low-dose CT scan of the lungs is recommended if you:  Currently smoke.  Have a history of at least 30 pack-years of smoking and you currently smoke or have quit within the past 15 years. A pack-year is smoking an average of one pack of cigarettes per day  for one year. Yearly screening should:  Continue until it has been 15 years since you quit.  Stop if you develop a health problem that would prevent you from having lung cancer treatment. Colorectal Cancer  This type of cancer can be detected and can often be prevented.  Routine colorectal cancer screening usually begins at age 57 and continues through age 80.  If you have risk factors for colon cancer, your health care provider may recommend that you be screened at an earlier age.  If you have a family history of colorectal cancer, talk with your health care provider about genetic screening.  Your health care  provider may also recommend using home test kits to check for hidden blood in your stool.  A small camera at the end of a tube can be used to examine your colon directly (sigmoidoscopy or colonoscopy). This is done to check for the earliest forms of colorectal cancer.  Direct examination of the colon should be repeated every 5-10 years until age 4. However, if early forms of precancerous polyps or small growths are found or if you have a family history or genetic risk for colorectal cancer, you may need to be screened more often. Skin Cancer  Check your skin from head to toe regularly.  Monitor any moles. Be sure to tell your health care provider: ? About any new moles or changes in moles, especially if there is a change in a mole's shape or color. ? If you have a mole that is larger than the size of a pencil eraser.  If any of your family members has a history of skin cancer, especially at a young age, talk with your health care provider about genetic screening.  Always use sunscreen. Apply sunscreen liberally and repeatedly throughout the day.  Whenever you are outside, protect yourself by wearing long sleeves, pants, a wide-brimmed hat, and sunglasses. What should I know about osteoporosis? Osteoporosis is a condition in which bone destruction happens more quickly than new bone creation. After menopause, you may be at an increased risk for osteoporosis. To help prevent osteoporosis or the bone fractures that can happen because of osteoporosis, the following is recommended:  If you are 75-12 years old, get at least 1,000 mg of calcium and at least 600 mg of vitamin D per day.  If you are older than age 89 but younger than age 34, get at least 1,200 mg of calcium and at least 600 mg of vitamin D per day.  If you are older than age 34, get at least 1,200 mg of calcium and at least 800 mg of vitamin D per day. Smoking and excessive alcohol intake increase the risk of osteoporosis. Eat foods  that are rich in calcium and vitamin D, and do weight-bearing exercises several times each week as directed by your health care provider. What should I know about how menopause affects my mental health? Depression may occur at any age, but it is more common as you become older. Common symptoms of depression include:  Low or sad mood.  Changes in sleep patterns.  Changes in appetite or eating patterns.  Feeling an overall lack of motivation or enjoyment of activities that you previously enjoyed.  Frequent crying spells. Talk with your health care provider if you think that you are experiencing depression. What should I know about immunizations? It is important that you get and maintain your immunizations. These include:  Tetanus, diphtheria, and pertussis (Tdap) booster vaccine.  Influenza every year  before the flu season begins.  Pneumonia vaccine.  Shingles vaccine. Your health care provider may also recommend other immunizations. This information is not intended to replace advice given to you by your health care provider. Make sure you discuss any questions you have with your health care provider. Document Released: 03/11/2005 Document Revised: 08/07/2015 Document Reviewed: 10/21/2014 Elsevier Interactive Patient Education  2019 [employer].

## 2018-03-07 NOTE — Progress Notes (Signed)
Subjective:    Patient ID: Jennifer Clayton, female    DOB: August 01, 1955, 63 y.o.   MRN: [id number]  CC: Jennifer Clayton is a 63 y.o. female who presents today for physical exam.    HPI: Insomnia- doing well on [place]. No strange dreams.   Migraine - has aura, has 'pressure' in right eye.No numbness, vision loss, confusion. HA feel like HA in the past. 'not as odten as used to be.' Uses imitrex with relief. May go weeks without imitrex.   Not on wellbutrin, however plans to start back since working out again.    Fell roller skating 12/2017, following [facility] for vertebral fracture. Wearing brace. No pain.        Colorectal Cancer Screening: UTD  Breast Cancer Screening: Mammogram UTD Cervical Cancer Screening: History of hysterectomy.  States no cervix. No vaginal bleeding, pelvic pain. No longer doing Pap smear. Bone Health screening/DEXA for 65+: due Lung Cancer Screening: Doesn't have 30 year pack year history and age > 29 years. Immunizations       Tetanus - utd         Labs: Screening labs today. Exercise: Gets regular exercise.  Alcohol use: rare  Smoking/tobacco use: Nonsmoker.  Regular dental exams: UTD Wears seat belt: Yes. H/o skin cancer. Follows with dermatology.   HISTORY:  Past Medical History:  Diagnosis Date  . Cancer ([place])    skin cancer (left leg)  . H/O radioactive iodine thyroid ablation     Past Surgical History:  Procedure Laterality Date  . ABDOMINAL HYSTERECTOMY     Age 70, for bleeding and cyst  . VAGINAL DELIVERY     Family History  Problem Relation Age of Onset  . Cancer Mother        lung, non-smoker  . Dementia Father        related to traumatic brain injury  . Breast cancer Neg Hx       ALLERGIES: Zomepirac; Amoxicillin-pot clavulanate; and Other  Current Outpatient Medications on File Prior to Visit  Medication Sig Dispense Refill  . buPROPion (WELLBUTRIN XL) 150 MG 24 hr tablet Take one tablet by mouth every  morning for 7 days, and then increase to two tablets by mouth every morning. 120 tablet 1  . levothyroxine (SYNTHROID, LEVOTHROID) 100 MCG tablet TAKE ONE TABLET EVERY DAY 90 tablet 3  . Multiple Vitamins-Minerals (MULTIVITAMIN ADULT PO) Take 1 tablet by mouth daily.    . SUMAtriptan (IMITREX) 50 MG tablet MAY REPEAT IN 2 HOURS IF HEADACHE PERSISTS OR RECURS 15 tablet 0  . zolpidem (AMBIEN) 5 MG tablet TAKE 1 TABLET BY MOUTH AT BEDTIME 30 tablet 1   No current facility-administered medications on file prior to visit.     Social History   Tobacco Use  . Smoking status: Never Smoker  . Smokeless tobacco: Never Used  Substance Use Topics  . Alcohol use: Yes    Alcohol/week: 1.0 standard drinks    Types: 1 Glasses of wine per week    Comment: rare  . Drug use: Not on file    Review of Systems  Constitutional: Negative for chills, fever and unexpected weight change.  HENT: Negative for congestion.   Respiratory: Negative for cough.   Cardiovascular: Negative for chest pain, palpitations and leg swelling.  Gastrointestinal: Negative for nausea and vomiting.  Genitourinary: Negative for dyspareunia, vaginal discharge and vaginal pain.  Musculoskeletal: Negative for arthralgias and myalgias.  Skin: Negative for rash.  Neurological: Negative  for headaches.  Hematological: Negative for adenopathy.  Psychiatric/Behavioral: Negative for confusion.      Objective:    BP 140/60   Pulse 78   Temp 97.7 F (36.5 C) (Oral)   Resp 16   Ht 5\' 3"  (1.6 m)   Wt 133 lb 2 oz (60.4 kg)   SpO2 98%   BMI 23.58 kg/m   BP Readings from Last 3 Encounters:  03/07/18 140/60  06/23/17 140/82  03/10/16 (!) 142/70   Wt Readings from Last 3 Encounters:  03/07/18 133 lb 2 oz (60.4 kg)  06/23/17 133 lb 2 oz (60.4 kg)  03/10/16 133 lb 3.2 oz (60.4 kg)    Physical Exam Vitals signs reviewed.  Constitutional:      Appearance: She is well-developed.  Eyes:     Conjunctiva/sclera: Conjunctivae  normal.  Neck:     Thyroid: No thyroid mass or thyromegaly.  Cardiovascular:     Rate and Rhythm: Normal rate and regular rhythm.     Pulses: Normal pulses.     Heart sounds: Normal heart sounds.  Pulmonary:     Effort: Pulmonary effort is normal.     Breath sounds: Normal breath sounds. No wheezing, rhonchi or rales.  Chest:     Breasts: Breasts are symmetrical.        Right: No inverted nipple, mass, nipple discharge, skin change or tenderness.        Left: No inverted nipple, mass, nipple discharge, skin change or tenderness.  Lymphadenopathy:     Head:     Right side of head: No submental, submandibular, tonsillar, preauricular, posterior auricular or occipital adenopathy.     Left side of head: No submental, submandibular, tonsillar, preauricular, posterior auricular or occipital adenopathy.     Cervical: No cervical adenopathy.     Right cervical: No superficial, deep or posterior cervical adenopathy.    Left cervical: No superficial, deep or posterior cervical adenopathy.  Skin:    General: Skin is warm and dry.  Neurological:     Mental Status: She is alert.  Psychiatric:        Speech: Speech normal.        Behavior: Behavior normal.        Thought Content: Thought content normal.        Assessment & Plan:   Problem List Items Addressed This Visit      Cardiovascular and Mediastinum   Migraine    No strokelike seizures, vision changes with migraine. Improved.  Counseled educated patient at length in regards to symptoms of CVA versus migraine.  She understands if any changes in her headache presentation, frequency to follow-up with me ASAP.May continue imitrex.         Other   Insomnia    Doing well on ambien . Will continue.       Routine physical examination - Primary    Clinical breast exam performed today.  Deferred pelvic exam in the absence of complaints, no longer screening for Pap smears.  Patient will schedule DEXA scan.  Screening labs ordered and  discussed with patient; she would like typical cpe labs.       Relevant Orders   TSH   CBC with Differential/Platelet   Comprehensive metabolic panel   Hemoglobin A1c   Lipid panel   VITAMIN D 25 Hydroxy (Vit-D Deficiency, Fractures)   DG Bone Density       I am having Jennifer Clayton maintain her levothyroxine, Multiple Vitamins-Minerals (MULTIVITAMIN ADULT PO),  buPROPion, zolpidem, and SUMAtriptan.   No orders of the defined types were placed in this encounter.   Return precautions given.   Risks, benefits, and alternatives of the medications and treatment plan prescribed today were discussed, and patient expressed understanding.   Education regarding symptom management and diagnosis given to patient on AVS.   Continue to follow with Burnard Hawthorne, FNP for routine health maintenance.   Lyn Henri Koper and I agreed with plan.   Mable Paris, FNP

## 2018-03-07 NOTE — Assessment & Plan Note (Signed)
Clinical breast exam performed today.  Deferred pelvic exam in the absence of complaints, no longer screening for Pap smears.  Patient will schedule DEXA scan.  Screening labs ordered and discussed with patient; she would like typical cpe labs.

## 2018-03-07 NOTE — Assessment & Plan Note (Signed)
No strokelike seizures, vision changes with migraine. Improved.  Counseled educated patient at length in regards to symptoms of CVA versus migraine.  She understands if any changes in her headache presentation, frequency to follow-up with me ASAP.May continue imitrex.

## 2018-03-08 ENCOUNTER — Other Ambulatory Visit: Payer: Self-pay | Admitting: Family

## 2018-03-08 DIAGNOSIS — G4700 Insomnia, unspecified: Secondary | ICD-10-CM

## 2018-03-09 NOTE — Telephone Encounter (Signed)
Call pt Appears she filled ambien on 03/08/2018, then she wouldn't need refill  Please advise to call back when she needs a refill

## 2018-03-09 NOTE — Telephone Encounter (Signed)
Called pt and she said that she has been contacted by pharmacy regarding refill from 03/08/18 being ready for pick up. She will contact the office when she needs another refill.

## 2018-03-12 ENCOUNTER — Other Ambulatory Visit: Payer: Self-pay | Admitting: Family

## 2018-03-12 DIAGNOSIS — E039 Hypothyroidism, unspecified: Secondary | ICD-10-CM

## 2018-03-29 ENCOUNTER — Other Ambulatory Visit: Payer: Self-pay | Admitting: Family

## 2018-03-29 DIAGNOSIS — E039 Hypothyroidism, unspecified: Secondary | ICD-10-CM

## 2018-03-31 ENCOUNTER — Other Ambulatory Visit: Payer: Self-pay | Admitting: Family

## 2018-03-31 DIAGNOSIS — E039 Hypothyroidism, unspecified: Secondary | ICD-10-CM

## 2018-04-05 DIAGNOSIS — E890 Postprocedural hypothyroidism: Secondary | ICD-10-CM | POA: Diagnosis not present

## 2018-04-12 ENCOUNTER — Other Ambulatory Visit: Payer: Self-pay | Admitting: Family

## 2018-04-12 DIAGNOSIS — G43919 Migraine, unspecified, intractable, without status migrainosus: Secondary | ICD-10-CM

## 2018-04-16 ENCOUNTER — Encounter: Payer: Self-pay | Admitting: Family

## 2018-04-23 ENCOUNTER — Other Ambulatory Visit: Payer: Self-pay | Admitting: Family

## 2018-04-23 DIAGNOSIS — G4700 Insomnia, unspecified: Secondary | ICD-10-CM

## 2018-04-23 NOTE — Telephone Encounter (Signed)
I looked up patient on [place] Controlled Substances Reporting System and saw no activity that raised concern of inappropriate use.   

## 2018-05-30 DIAGNOSIS — Z85828 Personal history of other malignant neoplasm of skin: Secondary | ICD-10-CM | POA: Diagnosis not present

## 2018-05-30 DIAGNOSIS — L814 Other melanin hyperpigmentation: Secondary | ICD-10-CM | POA: Diagnosis not present

## 2018-05-30 DIAGNOSIS — Z1283 Encounter for screening for malignant neoplasm of skin: Secondary | ICD-10-CM | POA: Diagnosis not present

## 2018-05-30 DIAGNOSIS — D1800 Hemangioma unspecified site: Secondary | ICD-10-CM | POA: Diagnosis not present

## 2018-05-30 DIAGNOSIS — Z872 Personal history of diseases of the skin and subcutaneous tissue: Secondary | ICD-10-CM

## 2018-05-30 DIAGNOSIS — L812 Freckles: Secondary | ICD-10-CM | POA: Diagnosis not present

## 2018-05-30 DIAGNOSIS — D485 Neoplasm of uncertain behavior of skin: Secondary | ICD-10-CM | POA: Diagnosis not present

## 2018-05-30 DIAGNOSIS — L820 Inflamed seborrheic keratosis: Secondary | ICD-10-CM | POA: Diagnosis not present

## 2018-05-30 DIAGNOSIS — L570 Actinic keratosis: Secondary | ICD-10-CM | POA: Diagnosis not present

## 2018-05-30 DIAGNOSIS — L718 Other rosacea: Secondary | ICD-10-CM | POA: Diagnosis not present

## 2018-05-30 DIAGNOSIS — L219 Seborrheic dermatitis, unspecified: Secondary | ICD-10-CM | POA: Diagnosis not present

## 2018-05-30 DIAGNOSIS — L821 Other seborrheic keratosis: Secondary | ICD-10-CM | POA: Diagnosis not present

## 2018-05-30 HISTORY — DX: Personal history of diseases of the skin and subcutaneous tissue: Z87.2

## 2018-06-23 ENCOUNTER — Other Ambulatory Visit: Payer: Self-pay | Admitting: Family

## 2018-06-23 DIAGNOSIS — G4700 Insomnia, unspecified: Secondary | ICD-10-CM

## 2018-06-27 NOTE — Telephone Encounter (Signed)
Call pt ambien refilled 06/23/18. I wrote script to be filled 07/24/18  Please education patient. This is controlled substance;  In order for me to prescribe medication,  Patient must be seen every 3-6 months. please make follow-up appointment.  I looked up patient on [place] Controlled Substances Reporting System and saw no activity that raised concern of inappropriate use.

## 2018-06-28 DIAGNOSIS — L820 Inflamed seborrheic keratosis: Secondary | ICD-10-CM | POA: Diagnosis not present

## 2018-06-28 DIAGNOSIS — L570 Actinic keratosis: Secondary | ICD-10-CM | POA: Diagnosis not present

## 2018-06-28 DIAGNOSIS — L718 Other rosacea: Secondary | ICD-10-CM | POA: Diagnosis not present

## 2018-06-29 NOTE — Telephone Encounter (Signed)
Patient scheduled next Friday 07/06/18 for virtual.

## 2018-07-06 ENCOUNTER — Other Ambulatory Visit: Payer: Self-pay | Admitting: Family

## 2018-07-06 ENCOUNTER — Other Ambulatory Visit: Payer: Self-pay

## 2018-07-06 ENCOUNTER — Ambulatory Visit (INDEPENDENT_AMBULATORY_CARE_PROVIDER_SITE_OTHER): Payer: PPO | Admitting: Family

## 2018-07-06 ENCOUNTER — Encounter: Payer: Self-pay | Admitting: Family

## 2018-07-06 DIAGNOSIS — G43809 Other migraine, not intractable, without status migrainosus: Secondary | ICD-10-CM | POA: Diagnosis not present

## 2018-07-06 DIAGNOSIS — R635 Abnormal weight gain: Secondary | ICD-10-CM | POA: Diagnosis not present

## 2018-07-06 DIAGNOSIS — G4700 Insomnia, unspecified: Secondary | ICD-10-CM | POA: Diagnosis not present

## 2018-07-06 DIAGNOSIS — E039 Hypothyroidism, unspecified: Secondary | ICD-10-CM

## 2018-07-06 NOTE — Patient Instructions (Signed)
Nice to speak to you!  Stay safe.

## 2018-07-06 NOTE — Assessment & Plan Note (Signed)
Unchanged. Continue regimen

## 2018-07-06 NOTE — Assessment & Plan Note (Signed)
Following with endocrine.  Will follow

## 2018-07-06 NOTE — Assessment & Plan Note (Signed)
Doing well. Continue [employer]

## 2018-07-06 NOTE — Progress Notes (Signed)
This visit type was conducted due to national recommendations for restrictions regarding the COVID-19 pandemic (e.g. social distancing).  This format is felt to be most appropriate for this patient at this time.  All issues noted in this document were discussed and addressed.  No physical exam was performed (except for noted visual exam findings with Video Visits). Virtual Visit via Video Note  I connected with@  on 07/06/18 at  8:30 AM EDT by a video enabled telemedicine application and verified that I am speaking with the correct person using two identifiers.  Location patient: home Location provider:work Persons participating in the virtual visit: patient, provider  I discussed the limitations of evaluation and management by telemedicine and the availability of in person appointments. The patient expressed understanding and agreed to proceed.  Interactive audio and video telecommunications were attempted between this provider and patient, however failed, due to patient having technical difficulties or patient did not have access to video capability.  We continued and completed visit with audio only.   HPI:  Feels well. No new complaints.   Insomnia- Doing well on ambien.   Migraine- unchanged from prior HA. Thinks weather is contributory. 3 HA's per month. Doesn't use NSAIDs,   Hypothyroidism- Following with endocrine. On 136mcg synthroid now.   Weight gain- Continues to stay on wellbutrin. Weight is unchanged. Walking outdoors.    ROS: See pertinent positives and negatives per HPI.  Past Medical History:  Diagnosis Date  . Cancer ([place])    skin cancer (left leg)  . H/O radioactive iodine thyroid ablation     Past Surgical History:  Procedure Laterality Date  . ABDOMINAL HYSTERECTOMY     Age 63, for bleeding and cyst  . VAGINAL DELIVERY      Family History  Problem Relation Age of Onset  . Cancer Mother        lung, non-smoker  . Dementia Father        related to  traumatic brain injury  . Breast cancer Neg Hx     SOCIAL HX: never smoker   Current Outpatient Medications:  .  buPROPion (WELLBUTRIN XL) 150 MG 24 hr tablet, Take one tablet by mouth every morning for 7 days, and then increase to two tablets by mouth every morning., Disp: 120 tablet, Rfl: 1 .  levothyroxine (SYNTHROID, LEVOTHROID) 100 MCG tablet, TAKE 1 TABLET DAILY, Disp: 90 tablet, Rfl: 0 .  Multiple Vitamins-Minerals (MULTIVITAMIN ADULT PO), Take 1 tablet by mouth daily., Disp: , Rfl:  .  SUMAtriptan (IMITREX) 50 MG tablet, TAKE ONE TABLET BY MOUTH AT ONCE - MAY REPEAT ONCE IN 2 HOURS IF NECESSARY, Disp: 15 tablet, Rfl: 2 .  zolpidem (AMBIEN) 5 MG tablet, TAKE ONE TABLET BY MOUTH AT BEDTIME, Disp: 30 tablet, Rfl: 1   ASSESSMENT AND PLAN:  Discussed the following assessment and plan:  Other migraine without status migrainosus, not intractable  Insomnia, unspecified type  Weight gain  Hypothyroidism, unspecified type  Problem List Items Addressed This Visit      Cardiovascular and Mediastinum   Migraine    Unchanged. Continue regimen        Endocrine   Hypothyroidism    Following with endocrine.  Will follow        Other   Insomnia    Doing well. Continue ambien      Weight gain    Unchanged, will let me know if she like to pursue nutrition consult.  She would like to continue Wellbutrin for now  I discussed the assessment and treatment plan with the patient. The patient was provided an opportunity to ask questions and all were answered. The patient agreed with the plan and demonstrated an understanding of the instructions.   The patient was advised to call back or seek an in-person evaluation if the symptoms worsen or if the condition fails to improve as anticipated.   Mable Paris, FNP   I spent 15  min non face to face w/ pt.

## 2018-07-06 NOTE — Assessment & Plan Note (Signed)
Unchanged, will let me know if she like to pursue nutrition consult.  She would like to continue Wellbutrin for now

## 2018-07-09 DIAGNOSIS — M9905 Segmental and somatic dysfunction of pelvic region: Secondary | ICD-10-CM | POA: Diagnosis not present

## 2018-07-09 DIAGNOSIS — M955 Acquired deformity of pelvis: Secondary | ICD-10-CM | POA: Diagnosis not present

## 2018-07-09 DIAGNOSIS — M5136 Other intervertebral disc degeneration, lumbar region: Secondary | ICD-10-CM | POA: Diagnosis not present

## 2018-07-09 DIAGNOSIS — M9903 Segmental and somatic dysfunction of lumbar region: Secondary | ICD-10-CM | POA: Diagnosis not present

## 2018-07-12 DIAGNOSIS — M9905 Segmental and somatic dysfunction of pelvic region: Secondary | ICD-10-CM | POA: Diagnosis not present

## 2018-07-12 DIAGNOSIS — M9903 Segmental and somatic dysfunction of lumbar region: Secondary | ICD-10-CM | POA: Diagnosis not present

## 2018-07-12 DIAGNOSIS — M955 Acquired deformity of pelvis: Secondary | ICD-10-CM | POA: Diagnosis not present

## 2018-07-12 DIAGNOSIS — M5136 Other intervertebral disc degeneration, lumbar region: Secondary | ICD-10-CM | POA: Diagnosis not present

## 2018-07-16 DIAGNOSIS — M5136 Other intervertebral disc degeneration, lumbar region: Secondary | ICD-10-CM | POA: Diagnosis not present

## 2018-07-16 DIAGNOSIS — M955 Acquired deformity of pelvis: Secondary | ICD-10-CM | POA: Diagnosis not present

## 2018-07-16 DIAGNOSIS — M9905 Segmental and somatic dysfunction of pelvic region: Secondary | ICD-10-CM | POA: Diagnosis not present

## 2018-07-16 DIAGNOSIS — M9903 Segmental and somatic dysfunction of lumbar region: Secondary | ICD-10-CM | POA: Diagnosis not present

## 2018-07-18 ENCOUNTER — Other Ambulatory Visit: Payer: Self-pay | Admitting: Family

## 2018-07-18 DIAGNOSIS — G43919 Migraine, unspecified, intractable, without status migrainosus: Secondary | ICD-10-CM

## 2018-07-23 DIAGNOSIS — M5136 Other intervertebral disc degeneration, lumbar region: Secondary | ICD-10-CM | POA: Diagnosis not present

## 2018-07-23 DIAGNOSIS — M9905 Segmental and somatic dysfunction of pelvic region: Secondary | ICD-10-CM | POA: Diagnosis not present

## 2018-07-23 DIAGNOSIS — M9903 Segmental and somatic dysfunction of lumbar region: Secondary | ICD-10-CM | POA: Diagnosis not present

## 2018-07-23 DIAGNOSIS — M955 Acquired deformity of pelvis: Secondary | ICD-10-CM | POA: Diagnosis not present

## 2018-08-27 DIAGNOSIS — E890 Postprocedural hypothyroidism: Secondary | ICD-10-CM | POA: Diagnosis not present

## 2018-08-29 ENCOUNTER — Other Ambulatory Visit: Payer: Self-pay

## 2018-08-29 DIAGNOSIS — E890 Postprocedural hypothyroidism: Secondary | ICD-10-CM | POA: Diagnosis not present

## 2018-09-19 ENCOUNTER — Encounter: Payer: Self-pay | Admitting: Family

## 2018-09-24 ENCOUNTER — Telehealth: Payer: Self-pay

## 2018-09-24 NOTE — Telephone Encounter (Signed)
LM asking patient to call back in regards to [place] message to return to [facility].

## 2018-11-06 ENCOUNTER — Other Ambulatory Visit: Payer: Self-pay | Admitting: Family

## 2018-11-06 DIAGNOSIS — R635 Abnormal weight gain: Secondary | ICD-10-CM

## 2018-11-12 ENCOUNTER — Other Ambulatory Visit: Payer: Self-pay | Admitting: Family

## 2018-11-12 DIAGNOSIS — G4700 Insomnia, unspecified: Secondary | ICD-10-CM

## 2018-11-12 NOTE — Telephone Encounter (Signed)
Last filled 07/24/18 for 30 with 1 refill.

## 2018-12-18 ENCOUNTER — Other Ambulatory Visit: Payer: Self-pay | Admitting: Family

## 2018-12-18 DIAGNOSIS — E039 Hypothyroidism, unspecified: Secondary | ICD-10-CM

## 2019-01-05 ENCOUNTER — Other Ambulatory Visit: Payer: Self-pay | Admitting: Family

## 2019-01-05 DIAGNOSIS — R635 Abnormal weight gain: Secondary | ICD-10-CM

## 2019-02-07 ENCOUNTER — Other Ambulatory Visit: Payer: Self-pay | Admitting: Family Medicine

## 2019-02-07 DIAGNOSIS — G4700 Insomnia, unspecified: Secondary | ICD-10-CM

## 2019-02-11 NOTE — Telephone Encounter (Signed)
Call pt   I have refilled your ambien  However I wanted to remind you that this is controlled substance.   In order for me to prescribe medication,  patients must be seen every 3 months.   Please make follow-up appointment this month for any further refills.    I looked up patient on [place] Controlled Substances Reporting System and saw no activity that raised concern of inappropriate use.

## 2019-02-14 NOTE — Telephone Encounter (Signed)
Mychart message sent.

## 2019-02-28 DIAGNOSIS — E890 Postprocedural hypothyroidism: Secondary | ICD-10-CM | POA: Diagnosis not present

## 2019-03-05 ENCOUNTER — Other Ambulatory Visit: Payer: Self-pay

## 2019-03-05 ENCOUNTER — Ambulatory Visit (INDEPENDENT_AMBULATORY_CARE_PROVIDER_SITE_OTHER): Payer: PPO | Admitting: Family

## 2019-03-05 ENCOUNTER — Encounter: Payer: Self-pay | Admitting: Family

## 2019-03-05 VITALS — Ht 63.0 in | Wt 125.0 lb

## 2019-03-05 DIAGNOSIS — Z1231 Encounter for screening mammogram for malignant neoplasm of breast: Secondary | ICD-10-CM | POA: Diagnosis not present

## 2019-03-05 DIAGNOSIS — G4700 Insomnia, unspecified: Secondary | ICD-10-CM | POA: Diagnosis not present

## 2019-03-05 DIAGNOSIS — G43809 Other migraine, not intractable, without status migrainosus: Secondary | ICD-10-CM

## 2019-03-05 DIAGNOSIS — R635 Abnormal weight gain: Secondary | ICD-10-CM | POA: Diagnosis not present

## 2019-03-05 MED ORDER — BUPROPION HCL ER (XL) 300 MG PO TB24: 300.0000 mg | ORAL_TABLET | Freq: Every day | ORAL | 1 refills | Status: DC

## 2019-03-05 NOTE — Assessment & Plan Note (Addendum)
Patient description of migraine appears to be cluster-like occurring rather consistently for several days with the end of the month.  Severity has not changed and headache responds to Imitrex.  Headache severity does not awaken her . she has history of hysterectomy and oophrectomy bilaterally-I do not think this is related to menses.  Etiology of the headache is nonspecific at this time.  Unable to identify any triggers for this.  At this time do not have blood pressure for today and her blood pressure in the past was moderately elevated, advised her to keep a pressure log in the next couple weeks ;we will have close follow-up.

## 2019-03-05 NOTE — Assessment & Plan Note (Signed)
Doing well on Wellbutrin , will continue.  

## 2019-03-05 NOTE — Assessment & Plan Note (Signed)
Doing well on Ambien, no side effects.  We will continue

## 2019-03-05 NOTE — Progress Notes (Signed)
Virtual Visit via Video Note  I connected with@  on 03/05/19 at 11:00 AM EST by a video enabled telemedicine application and verified that I am speaking with the correct person using two identifiers.  Location patient: home Location provider:work Persons participating in the virtual visit: patient, provider  I discussed the limitations of evaluation and management by telemedicine and the availability of in person appointments. The patient expressed understanding and agreed to proceed.   HPI:  Follow up Feels well, no complaints.   Migraine - over the last 6 months, noticed slight worsening in frequency. Started tracking HA , and HA's present in the morning in the later part of the month. HA will occur between 345-5am. HA does NOT wake her from sleep. Normally on LEFT behind the eye. H/o skin cancer. Tries taking a quarter of imitrex which usually takes care of it. No vision changes, vision loss. Cannot think of trigger. 1-2 cups of tea without caffeine. PRN NSAID, 'every once in while.'   Insomnia- sleeping well on ambien. No falls.   Weight gain- thinks a little less, on the wellbutrin.  Continues to follow with dermatology    ROS: See pertinent positives and negatives per HPI.  Past Medical History:  Diagnosis Date  . Cancer ([place])    skin cancer (left leg)  . H/O radioactive iodine thyroid ablation     Past Surgical History:  Procedure Laterality Date  . ABDOMINAL HYSTERECTOMY     Age 64, for bleeding and cyst; NO ovaries  . VAGINAL DELIVERY      Family History  Problem Relation Age of Onset  . Cancer Mother        lung, non-smoker  . Dementia Father        related to traumatic brain injury  . Breast cancer Neg Hx     SOCIAL HX: never smoker   Current Outpatient Medications:  .  buPROPion (WELLBUTRIN XL) 300 MG 24 hr tablet, Take 1 tablet (300 mg total) by mouth daily., Disp: 90 tablet, Rfl: 1 .  levothyroxine (SYNTHROID) 100 MCG tablet, TAKE 1 TABLET EVERY  DAY ON EMPTY STOMACHWITH A GLASS OF WATER AT LEAST 30-60 MINBEFORE BREAKFAST, Disp: 90 tablet, Rfl: 0 .  Multiple Vitamins-Minerals (MULTIVITAMIN ADULT PO), Take 1 tablet by mouth daily., Disp: , Rfl:  .  SUMAtriptan (IMITREX) 50 MG tablet, TAKE 1 TABLET BY MOUTH ONCE-MAY REPEAT ONCE IN 2 HOURS IF NECESSARY, Disp: 15 tablet, Rfl: 2 .  zolpidem (AMBIEN) 5 MG tablet, TAKE 1 TABLET BY MOUTH AT BEDTIME, Disp: 30 tablet, Rfl: 0  EXAM:  VITALS per patient if applicable: BP Readings from Last 3 Encounters:  03/07/18 140/60  06/23/17 140/82  03/10/16 (!) 142/70     GENERAL: alert, oriented, appears well and in no acute distress  HEENT: atraumatic, conjunttiva clear, no obvious abnormalities on inspection of external nose and ears  NECK: normal movements of the head and neck  LUNGS: on inspection no signs of respiratory distress, breathing rate appears normal, no obvious gross SOB, gasping or wheezing  CV: no obvious cyanosis  MS: moves all visible extremities without noticeable abnormality  PSYCH/NEURO: pleasant and cooperative, no obvious depression or anxiety, speech and thought processing grossly intact  ASSESSMENT AND PLAN:  Discussed the following assessment and plan:  Other migraine without status migrainosus, not intractable  Encounter for screening mammogram for malignant neoplasm of breast - Plan: MM 3D SCREEN BREAST BILATERAL, DG Bone Density  Weight gain - Plan: buPROPion (WELLBUTRIN XL) 300  MG 24 hr tablet  Insomnia, unspecified type Problem List Items Addressed This Visit      Cardiovascular and Mediastinum   Migraine - Primary    Patient description of migraine appears to be cluster-like occurring rather consistently for several days with the end of the month.  Severity has not changed and headache responds to Imitrex.  Headache severity does not awaken her . she has history of hysterectomy and oophrectomy bilaterally-I do not think this is related to menses.   Etiology of the headache is nonspecific at this time.  Unable to identify any triggers for this.  At this time do not have blood pressure for today and her blood pressure in the past was moderately elevated, advised her to keep a pressure log in the next couple weeks ;we will have close follow-up.         Relevant Medications   buPROPion (WELLBUTRIN XL) 300 MG 24 hr tablet     Other   Insomnia    Doing well on Ambien, no side effects.  We will continue      Weight gain    Doing well on Wellbutrin, will continue      Relevant Medications   buPROPion (WELLBUTRIN XL) 300 MG 24 hr tablet    Other Visit Diagnoses    Encounter for screening mammogram for malignant neoplasm of breast       Relevant Orders   MM 3D SCREEN BREAST BILATERAL   DG Bone Density    Patient will call and schedule bone density and mammogram at [facility]  -we discussed possible serious and likely etiologies, options for evaluation and workup, limitations of telemedicine visit vs in person visit, treatment, treatment risks and precautions. Pt prefers to treat via telemedicine empirically rather then risking or undertaking an in person visit at this moment. Patient agrees to seek prompt in person care if worsening, new symptoms arise, or if is not improving with treatment.   I discussed the assessment and treatment plan with the patient. The patient was provided an opportunity to ask questions and all were answered. The patient agreed with the plan and demonstrated an understanding of the instructions.   The patient was advised to call back or seek an in-person evaluation if the symptoms worsen or if the condition fails to improve as anticipated.   Mable Paris, FNP

## 2019-03-14 ENCOUNTER — Encounter: Payer: Self-pay | Admitting: Family

## 2019-03-14 ENCOUNTER — Other Ambulatory Visit: Payer: Self-pay

## 2019-03-14 MED ORDER — AMLODIPINE BESYLATE 5 MG PO TABS: 5.0000 mg | ORAL_TABLET | Freq: Every day | ORAL | 3 refills | Status: DC

## 2019-03-26 ENCOUNTER — Telehealth: Payer: Self-pay | Admitting: Family

## 2019-03-26 NOTE — Telephone Encounter (Signed)
Left message for patient to call back and schedule Medicare Annual Wellness Visit (AWV) either virtually or audio only.  No hx of AWV; please schedule at anytime with Denisa O'Brien-Blaney at [facility]  Started Part B medicare 03/03/2018

## 2019-04-01 ENCOUNTER — Ambulatory Visit: Payer: PPO | Admitting: Family

## 2019-04-01 ENCOUNTER — Ambulatory Visit: Payer: PPO

## 2019-04-01 ENCOUNTER — Other Ambulatory Visit: Payer: Self-pay | Admitting: Family

## 2019-04-01 DIAGNOSIS — C50919 Malignant neoplasm of unspecified site of unspecified female breast: Secondary | ICD-10-CM

## 2019-04-01 DIAGNOSIS — G4700 Insomnia, unspecified: Secondary | ICD-10-CM

## 2019-04-01 HISTORY — DX: Malignant neoplasm of unspecified site of unspecified female breast: C50.919

## 2019-04-01 NOTE — Telephone Encounter (Signed)
I looked up patient on [place] Controlled Substances Reporting System and saw no activity that raised concern of inappropriate use.   

## 2019-04-01 NOTE — Telephone Encounter (Signed)
Refill request for Jennifer Clayton, last seen 03-05-19, last filled 02-11-19.  Please advise.

## 2019-04-02 DIAGNOSIS — E890 Postprocedural hypothyroidism: Secondary | ICD-10-CM | POA: Diagnosis not present

## 2019-04-08 ENCOUNTER — Other Ambulatory Visit: Payer: Self-pay

## 2019-04-08 ENCOUNTER — Ambulatory Visit (INDEPENDENT_AMBULATORY_CARE_PROVIDER_SITE_OTHER): Payer: PPO | Admitting: Family

## 2019-04-08 ENCOUNTER — Encounter: Payer: Self-pay | Admitting: Family

## 2019-04-08 DIAGNOSIS — G43809 Other migraine, not intractable, without status migrainosus: Secondary | ICD-10-CM | POA: Diagnosis not present

## 2019-04-08 DIAGNOSIS — I10 Essential (primary) hypertension: Secondary | ICD-10-CM | POA: Insufficient documentation

## 2019-04-08 NOTE — Patient Instructions (Signed)
Please continue the amlodipine 5 mg for now.  As we discussed, it looks like your blood pressure is working on being closer to 130-120 / 80 which is the goal.  We will give this more time.  Please take amlodipine at night to see if this helps.  Please monitor your headache and certainly let me know if any new concerns or symptoms.  Stay safe!

## 2019-04-08 NOTE — Progress Notes (Signed)
Subjective:    Patient ID: Jennifer Clayton, female    DOB: Jun 17, 1955, 64 y.o.   MRN: [id number]  CC: Jennifer Clayton is a 65 y.o. female who presents today for follow up.   HPI: HTN- amlodipine 5mg   Headache frequency has improved, occurring in the early morning. Not sure if helping yet as havent approached the month end, last month 19, 20th,  where she  HA's have always been early in morning. No ha with increased pressure. Not worse HA of life.     Follows with [facility] endocrine, hypothyroidism.last visit 04/02/19  HISTORY:  Past Medical History:  Diagnosis Date  . Cancer ([place])    skin cancer (left leg)  . H/O radioactive iodine thyroid ablation    Past Surgical History:  Procedure Laterality Date  . ABDOMINAL HYSTERECTOMY     Age 17, for bleeding and cyst; NO ovaries  . VAGINAL DELIVERY     Family History  Problem Relation Age of Onset  . Cancer Mother        lung, non-smoker  . Dementia Father        related to traumatic brain injury  . Breast cancer Neg Hx     Allergies: Zomepirac, Amoxicillin-pot clavulanate, and Other Current Outpatient Medications on File Prior to Visit  Medication Sig Dispense Refill  . amLODipine (NORVASC) 5 MG tablet Take 1 tablet (5 mg total) by mouth daily. 90 tablet 3  . buPROPion (WELLBUTRIN XL) 300 MG 24 hr tablet Take 1 tablet (300 mg total) by mouth daily. 90 tablet 1  . levothyroxine (SYNTHROID) 100 MCG tablet TAKE 1 TABLET EVERY DAY ON EMPTY STOMACHWITH A GLASS OF WATER AT LEAST 30-60 MINBEFORE BREAKFAST 90 tablet 0  . Multiple Vitamins-Minerals (MULTIVITAMIN ADULT PO) Take 1 tablet by mouth daily.    . SUMAtriptan (IMITREX) 50 MG tablet TAKE 1 TABLET BY MOUTH ONCE-MAY REPEAT ONCE IN 2 HOURS IF NECESSARY 15 tablet 2  . zolpidem (AMBIEN) 5 MG tablet TAKE 1 TABLET BY MOUTH AT BEDTIME 30 tablet 1   No current facility-administered medications on file prior to visit.    Social History   Tobacco Use  . Smoking status: Never Smoker    . Smokeless tobacco: Never Used  Substance Use Topics  . Alcohol use: Yes    Alcohol/week: 1.0 standard drinks    Types: 1 Glasses of wine per week    Comment: rare  . Drug use: Not on file    Review of Systems  Constitutional: Negative for chills and fever.  Eyes: Negative for visual disturbance.  Respiratory: Negative for cough.   Cardiovascular: Negative for chest pain, palpitations and leg swelling.  Gastrointestinal: Negative for nausea and vomiting.  Neurological: Positive for headaches. Negative for dizziness.      Objective:    BP 130/76   Pulse 79   Temp (!) 97.3 F (36.3 C)   Ht 5\' 3"  (1.6 m)   Wt 132 lb 6.4 oz (60.1 kg)   SpO2 97%   BMI 23.45 kg/m  BP Readings from Last 3 Encounters:  04/08/19 130/76  03/07/18 140/60  06/23/17 140/82   Wt Readings from Last 3 Encounters:  04/08/19 132 lb 6.4 oz (60.1 kg)  03/05/19 125 lb (56.7 kg)  03/07/18 133 lb 2 oz (60.4 kg)    Physical Exam Vitals reviewed.  Constitutional:      Appearance: She is well-developed.  HENT:     Mouth/Throat:     Pharynx: Uvula midline.  Eyes:     Conjunctiva/sclera: Conjunctivae normal.     Pupils: Pupils are equal, round, and reactive to light.     Comments: Fundus normal bilaterally.   Cardiovascular:     Rate and Rhythm: Normal rate and regular rhythm.     Pulses: Normal pulses.     Heart sounds: Normal heart sounds.  Pulmonary:     Effort: Pulmonary effort is normal.     Breath sounds: Normal breath sounds. No wheezing, rhonchi or rales.  Skin:    General: Skin is warm and dry.  Neurological:     Mental Status: She is alert.     Cranial Nerves: No cranial nerve deficit.     Sensory: No sensory deficit.     Deep Tendon Reflexes:     Reflex Scores:      Bicep reflexes are 2+ on the right side and 2+ on the left side.      Patellar reflexes are 2+ on the right side and 2+ on the left side.    Comments: Grip equal and strong bilateral upper extremities. Gait strong and  steady. Able to perform rapid alternating movement without difficulty.   Psychiatric:        Speech: Speech normal.        Behavior: Behavior normal.        Thought Content: Thought content normal.        Assessment & Plan:   Problem List Items Addressed This Visit      Cardiovascular and Mediastinum   HTN (hypertension)    Improved.  Patient and I felt comfortable with giving the amlodipine more time.  We discussed increasing the amlodipine versus adding a new agent.  At this time we decided to trial amlodipine in the evening to see if blood pressure would further approach goal of r between 120-130/80.  Patient will keep track of this and let me know how she is doing      Migraine    Stable.Reassured by normal neurologic exam.   She has not had a headache since starting amlodipine, although she typically has a headache at the later part of the month- we may not have given amlodipine enough.  Patient will continue to stay vigilant in regards to her headache , we will see if there is any change or improvement with improved blood pressure.  Patient will let me know how she is doing          I am having Jennifer Clayton "Jennifer Clayton" maintain her Multiple Vitamins-Minerals (MULTIVITAMIN ADULT PO), SUMAtriptan, levothyroxine, buPROPion, amLODipine, and zolpidem.   No orders of the defined types were placed in this encounter.   Return precautions given.   Risks, benefits, and alternatives of the medications and treatment plan prescribed today were discussed, and patient expressed understanding.   Education regarding symptom management and diagnosis given to patient on AVS.  Continue to follow with Burnard Hawthorne, FNP for routine health maintenance.   Jennifer Clayton and I agreed with plan.   Jennifer Paris, FNP

## 2019-04-08 NOTE — Assessment & Plan Note (Signed)
Stable.Reassured by normal neurologic exam.   She has not had a headache since starting amlodipine, although she typically has a headache at the later part of the month- we may not have given amlodipine enough.  Patient will continue to stay vigilant in regards to her headache , we will see if there is any change or improvement with improved blood pressure.  Patient will let me know how she is doing

## 2019-04-08 NOTE — Assessment & Plan Note (Signed)
Improved.  Patient and I felt comfortable with giving the amlodipine more time.  We discussed increasing the amlodipine versus adding a new agent.  At this time we decided to trial amlodipine in the evening to see if blood pressure would further approach goal of r between 120-130/80.  Patient will keep track of this and let me know how she is doing

## 2019-04-09 ENCOUNTER — Other Ambulatory Visit: Payer: Self-pay | Admitting: Family

## 2019-04-09 ENCOUNTER — Ambulatory Visit
Admission: RE | Admit: 2019-04-09 | Discharge: 2019-04-09 | Disposition: A | Discharge status: Home / Self Care | Payer: PPO | Source: Ambulatory Visit | Attending: Family | Admitting: Family

## 2019-04-09 DIAGNOSIS — M810 Age-related osteoporosis without current pathological fracture: Secondary | ICD-10-CM | POA: Diagnosis not present

## 2019-04-09 DIAGNOSIS — R928 Other abnormal and inconclusive findings on diagnostic imaging of breast: Secondary | ICD-10-CM

## 2019-04-09 DIAGNOSIS — Z1231 Encounter for screening mammogram for malignant neoplasm of breast: Secondary | ICD-10-CM | POA: Insufficient documentation

## 2019-04-09 DIAGNOSIS — Z13820 Encounter for screening for osteoporosis: Secondary | ICD-10-CM | POA: Diagnosis not present

## 2019-04-23 ENCOUNTER — Other Ambulatory Visit: Payer: Self-pay | Admitting: Family

## 2019-04-23 ENCOUNTER — Ambulatory Visit
Admission: RE | Admit: 2019-04-23 | Discharge: 2019-04-23 | Disposition: A | Discharge status: Home / Self Care | Payer: PPO | Source: Ambulatory Visit | Attending: Family | Admitting: Family

## 2019-04-23 DIAGNOSIS — N6489 Other specified disorders of breast: Secondary | ICD-10-CM | POA: Diagnosis not present

## 2019-04-23 DIAGNOSIS — R928 Other abnormal and inconclusive findings on diagnostic imaging of breast: Secondary | ICD-10-CM | POA: Diagnosis not present

## 2019-04-24 ENCOUNTER — Telehealth: Payer: Self-pay | Admitting: Family

## 2019-04-24 DIAGNOSIS — N6310 Unspecified lump in the right breast, unspecified quadrant: Secondary | ICD-10-CM

## 2019-04-24 NOTE — Telephone Encounter (Signed)
Spoke with patient in regards to recommendation for ultrasound-guided core biopsy right breast.  Her preference is to see general surgery, Dr. Bary Castilla.  Referral has been placed.    Dr Bary Castilla,   I am sending this very kind patient your way for biopsy. Let me know if you need anything from me. Thank you in advace,

## 2019-04-25 ENCOUNTER — Other Ambulatory Visit: Payer: Self-pay | Admitting: General Surgery

## 2019-04-25 DIAGNOSIS — C50211 Malignant neoplasm of upper-inner quadrant of right female breast: Secondary | ICD-10-CM | POA: Diagnosis not present

## 2019-04-25 DIAGNOSIS — R928 Other abnormal and inconclusive findings on diagnostic imaging of breast: Secondary | ICD-10-CM | POA: Diagnosis not present

## 2019-04-25 HISTORY — PX: BREAST BIOPSY: SHX20

## 2019-04-26 ENCOUNTER — Other Ambulatory Visit: Payer: Self-pay | Admitting: Anatomic Pathology & Clinical Pathology

## 2019-04-30 DIAGNOSIS — C50411 Malignant neoplasm of upper-outer quadrant of right female breast: Secondary | ICD-10-CM | POA: Diagnosis not present

## 2019-05-01 ENCOUNTER — Other Ambulatory Visit: Payer: Self-pay | Admitting: General Surgery

## 2019-05-01 DIAGNOSIS — C50411 Malignant neoplasm of upper-outer quadrant of right female breast: Secondary | ICD-10-CM

## 2019-05-02 LAB — SURGICAL PATHOLOGY

## 2019-05-07 ENCOUNTER — Telehealth: Payer: Self-pay | Admitting: Family

## 2019-05-07 ENCOUNTER — Encounter: Payer: Self-pay | Admitting: Family

## 2019-05-07 ENCOUNTER — Encounter
Admission: RE | Admit: 2019-05-07 | Discharge: 2019-05-07 | Disposition: A | Discharge status: Home / Self Care | Payer: PPO | Source: Ambulatory Visit | Attending: General Surgery | Admitting: General Surgery

## 2019-05-07 ENCOUNTER — Telehealth (INDEPENDENT_AMBULATORY_CARE_PROVIDER_SITE_OTHER): Payer: PPO | Admitting: Family

## 2019-05-07 ENCOUNTER — Other Ambulatory Visit: Payer: Self-pay

## 2019-05-07 DIAGNOSIS — G43809 Other migraine, not intractable, without status migrainosus: Secondary | ICD-10-CM

## 2019-05-07 DIAGNOSIS — M810 Age-related osteoporosis without current pathological fracture: Secondary | ICD-10-CM | POA: Diagnosis not present

## 2019-05-07 DIAGNOSIS — G4700 Insomnia, unspecified: Secondary | ICD-10-CM

## 2019-05-07 DIAGNOSIS — C50911 Malignant neoplasm of unspecified site of right female breast: Secondary | ICD-10-CM

## 2019-05-07 DIAGNOSIS — Z01810 Encounter for preprocedural cardiovascular examination: Secondary | ICD-10-CM | POA: Insufficient documentation

## 2019-05-07 DIAGNOSIS — I10 Essential (primary) hypertension: Secondary | ICD-10-CM | POA: Diagnosis not present

## 2019-05-07 DIAGNOSIS — C50919 Malignant neoplasm of unspecified site of unspecified female breast: Secondary | ICD-10-CM | POA: Insufficient documentation

## 2019-05-07 HISTORY — DX: Essential (primary) hypertension: I10

## 2019-05-07 HISTORY — DX: Hypothyroidism, unspecified: E03.9

## 2019-05-07 HISTORY — DX: Headache, unspecified: R51.9

## 2019-05-07 NOTE — Assessment & Plan Note (Signed)
Improved. Declines further evaluation including neuroimaging at this time.

## 2019-05-07 NOTE — Assessment & Plan Note (Signed)
Somewhat worse. Declines changing medication regimen at this time. She will let me know how I can support with recent dx of breast cancer.

## 2019-05-07 NOTE — Assessment & Plan Note (Signed)
Pending lumpectomy and consult with rad onc per patient. Will follow

## 2019-05-07 NOTE — Progress Notes (Signed)
Virtual Visit via Video Note  I connected with@  on 05/07/19 at 10:30 AM EDT by a video enabled telemedicine application and verified that I am speaking with the correct person using two identifiers.  Location patient: home Location provider:work  Persons participating in the virtual visit: patient, provider  I discussed the limitations of evaluation and management by telemedicine and the availability of in person appointments. The patient expressed understanding and agreed to proceed.   HPI: Recent diagnosis of breast cancer. Tearful when thinks about it. At work today. Some trouble sleeping. Very pleased with care under Dr Terri Piedra.  Right breast biopsy invasive mammary carcinoma . 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 Plans to see oncology after lumpectomy 05/13/19. Told she will need radiation for 5 weeks. No chemo needed per patient.   HA continue to be improved . Doesn't feel imaging of brain is necessary  Osteoporosis- taking vitamin D and calcium.     ROS: See pertinent positives and negatives per HPI.  Past Medical History:  Diagnosis Date  . Cancer ([place])    skin cancer (left leg)  . H/O radioactive iodine thyroid ablation     Past Surgical History:  Procedure Laterality Date  . ABDOMINAL HYSTERECTOMY     Age 64, for bleeding and cyst; NO ovaries  . VAGINAL DELIVERY      Family History  Problem Relation Age of Onset  . Cancer Mother        lung, non-smoker  . Dementia Father        related to traumatic brain injury  . Breast cancer Neg Hx        Current Outpatient Medications:  .  amLODipine (NORVASC) 5 MG tablet, Take 1 tablet (5 mg total) by mouth daily. (Patient taking differently: Take 5 mg by mouth at bedtime. ), Disp: 90 tablet, Rfl: 3 .  Ascorbic Acid (VITAMIN C PO), Take 1 tablet by mouth daily., Disp: , Rfl:  .  aspirin EC 81 MG tablet, Take 81 mg by mouth every other day., Disp: , Rfl:  .  buPROPion (WELLBUTRIN XL) 300 MG 24 hr tablet, Take 1  tablet (300 mg total) by mouth daily., Disp: 90 tablet, Rfl: 1 .  levothyroxine (SYNTHROID) 100 MCG tablet, TAKE 1 TABLET EVERY DAY ON EMPTY STOMACHWITH A GLASS OF WATER AT LEAST 30-60 MINBEFORE BREAKFAST (Patient taking differently: Take 100 mcg by mouth daily before breakfast. ), Disp: 90 tablet, Rfl: 0 .  Multiple Vitamins-Minerals (MULTIVITAMIN ADULT PO), Take 1 tablet by mouth daily., Disp: , Rfl:  .  SUMAtriptan (IMITREX) 50 MG tablet, TAKE 1 TABLET BY MOUTH ONCE-MAY REPEAT ONCE IN 2 HOURS IF NECESSARY (Patient taking differently: Take 12.5 mg by mouth every 2 (two) hours as needed for migraine or headache. ), Disp: 15 tablet, Rfl: 2 .  zolpidem (AMBIEN) 5 MG tablet, TAKE 1 TABLET BY MOUTH AT BEDTIME (Patient taking differently: Take 2.5 mg by mouth at bedtime. ), Disp: 30 tablet, Rfl: 1  EXAM:  VITALS per patient if applicable:  GENERAL: alert, oriented, appears well and in no acute distress  HEENT: atraumatic, conjunttiva clear, no obvious abnormalities on inspection of external nose and ears  NECK: normal movements of the head and neck  LUNGS: on inspection no signs of respiratory distress, breathing rate appears normal, no obvious gross SOB, gasping or wheezing  CV: no obvious cyanosis  MS: moves all visible extremities without noticeable abnormality  PSYCH/NEURO: pleasant and cooperative, no obvious depression or anxiety, speech and thought  processing grossly intact  ASSESSMENT AND PLAN:  Discussed the following assessment and plan:  Malignant neoplasm of right female breast, unspecified estrogen receptor status, unspecified site of breast ([place])  Other migraine without status migrainosus, not intractable  Osteoporosis, unspecified osteoporosis type, unspecified pathological fracture presence  Insomnia, unspecified type Problem List Items Addressed This Visit      Cardiovascular and Mediastinum   Migraine    Improved. Declines further evaluation including neuroimaging  at this time.         Musculoskeletal and Integument   Osteoporosis    Advised optimization of vitamin D and calcium.  At this time it sounds like breast cancer is related estrogen and perhaps an agent such as Evista may be started by radiation oncology, I will defer starting bisphosphonate today.  I would like her to establish with oncology  appropriate medications going forward considering diagnosis.        Other   Breast cancer ([place])    Pending lumpectomy and consult with rad onc per patient. Will follow       Insomnia    Somewhat worse. Declines changing medication regimen at this time. She will let me know how I can support with recent dx of breast cancer.          -we discussed possible serious and likely etiologies, options for evaluation and workup, limitations of telemedicine visit vs in person visit, treatment, treatment risks and precautions. Pt prefers to treat via telemedicine empirically rather then risking or undertaking an in person visit at this moment. Patient agrees to seek prompt in person care if worsening, new symptoms arise, or if is not improving with treatment.   I discussed the assessment and treatment plan with the patient. The patient was provided an opportunity to ask questions and all were answered. The patient agreed with the plan and demonstrated an understanding of the instructions.   The patient was advised to call back or seek an in-person evaluation if the symptoms worsen or if the condition fails to improve as anticipated.   Mable Paris, FNP

## 2019-05-07 NOTE — Assessment & Plan Note (Signed)
Advised optimization of vitamin D and calcium.  At this time it sounds like breast cancer is related estrogen and perhaps an agent such as Evista may be started by radiation oncology, I will defer starting bisphosphonate today.  I would like her to establish with oncology  appropriate medications going forward considering diagnosis.

## 2019-05-07 NOTE — Patient Instructions (Addendum)
Your procedure is scheduled on: 05-13-19 MONDAY Report to [facility] (2ND DESK ON RIGHT) @ 8:15 AM  Remember: Instructions that are not followed completely may result in serious medical risk, up to and including death, or upon the discretion of your surgeon and anesthesiologist your surgery may need to be rescheduled.    _x___ 1. Do not eat food after midnight the night before your procedure. NO GUM OR CANDY AFTER MIDNIGHT. You may drink clear liquids up to 2 hours before you are scheduled to arrive at the hospital for your procedure.  Do not drink clear liquids within 2 hours of your scheduled arrival to the hospital.  Clear liquids include  --Water or Apple juice without pulp  --Gatorade  --Black Coffee or Clear Tea (No milk, no creamers, do not add anything to the coffee or Tea   ____Ensure clear carbohydrate drink on the way to the hospital for bariatric patients  ____Ensure clear carbohydrate drink 3 hours before surgery.     __x__ 2. No Alcohol for 24 hours before or after surgery.   __x__3. No Smoking or e-cigarettes for 24 prior to surgery.  Do not use any chewable tobacco products for at least 6 hour prior to surgery   ____  4. Bring all medications with you on the day of surgery if instructed.    __x__ 5. Notify your doctor if there is any change in your medical condition     (cold, fever, infections).    x___6. On the morning of surgery brush your teeth with toothpaste and water.  You may rinse your mouth with mouth wash if you wish.  Do not swallow any toothpaste or mouthwash.   Do not wear jewelry, make-up, hairpins, clips or nail polish.  Do not wear lotions, powders, or perfumes.   Do not shave 48 hours prior to surgery. Men may shave face and neck.  Do not bring valuables to the hospital.    [facility] is not responsible for any belongings or valuables.               Contacts, dentures or bridgework may not be worn into surgery.  Leave your  suitcase in the car. After surgery it may be brought to your room.  For patients admitted to the hospital, discharge time is determined by your  treatment team.  _  Patients discharged the day of surgery will not be allowed to drive home.  You will need someone to drive you home and stay with you the night of your procedure.    Please read over the following fact sheets that you were given:   [facility] Preparing for Surgery   _x___ TAKE THE FOLLOWING MEDICATION THE MORNING OF SURGERY WITH A SMALL SIP OF WATER. These include:  1. SYNTHROID (LEVOTHYROXINE)  2. WELLBUTRIN (BUPROPION)  3.  4.  5.  6.  ____Fleets enema or Magnesium Citrate as directed.   _x___ Use CHG Soap or sage wipes as directed on instruction sheet   ____ Use inhalers on the day of surgery and bring to hospital day of surgery  ____ Stop Metformin and Janumet 2 days prior to surgery.    ____ Take 1/2 of usual insulin dose the night before surgery and none on the morning surgery.   _x___ Follow recommendations from Cardiologist, Pulmonologist or PCP regarding stopping Aspirin, Coumadin, Plavix ,Eliquis, Effient, or Pradaxa, and Pletal-ASPIRIN WAS STOPPED LAST WEEK  X____Stop Anti-inflammatories such as Advil, Aleve, Ibuprofen, Motrin, Naproxen, Naprosyn,  Goodies powders or aspirin product NOW- OK to take Tylenol OR IMITREX IF NEEDED   ____ Stop supplements until after surgery.   ____ Bring C-Pap to the hospital.

## 2019-05-07 NOTE — Telephone Encounter (Signed)
Left voicemail message for pt to call and scheduled 3 month follow up.

## 2019-05-08 ENCOUNTER — Encounter
Admission: RE | Admit: 2019-05-08 | Discharge: 2019-05-08 | Disposition: A | Discharge status: Home / Self Care | Payer: PPO | Source: Ambulatory Visit | Attending: General Surgery | Admitting: General Surgery

## 2019-05-08 DIAGNOSIS — Z01810 Encounter for preprocedural cardiovascular examination: Secondary | ICD-10-CM | POA: Diagnosis not present

## 2019-05-08 DIAGNOSIS — I10 Essential (primary) hypertension: Secondary | ICD-10-CM | POA: Diagnosis not present

## 2019-05-09 ENCOUNTER — Other Ambulatory Visit
Admission: RE | Admit: 2019-05-09 | Discharge: 2019-05-09 | Disposition: A | Discharge status: Home / Self Care | Payer: PPO | Source: Ambulatory Visit | Attending: General Surgery | Admitting: General Surgery

## 2019-05-09 ENCOUNTER — Other Ambulatory Visit: Payer: Self-pay

## 2019-05-09 DIAGNOSIS — Z20822 Contact with and (suspected) exposure to covid-19: Secondary | ICD-10-CM | POA: Insufficient documentation

## 2019-05-09 DIAGNOSIS — Z01812 Encounter for preprocedural laboratory examination: Secondary | ICD-10-CM | POA: Diagnosis not present

## 2019-05-09 LAB — SARS CORONAVIRUS 2 (TAT 6-24 HRS): SARS Coronavirus 2: NEGATIVE

## 2019-05-13 ENCOUNTER — Ambulatory Visit
Admission: RE | Admit: 2019-05-13 | Discharge: 2019-05-13 | Disposition: A | Discharge status: Home / Self Care | Payer: PPO | Source: Ambulatory Visit | Attending: General Surgery | Admitting: General Surgery

## 2019-05-13 ENCOUNTER — Ambulatory Visit: Payer: PPO | Admitting: Certified Registered"

## 2019-05-13 ENCOUNTER — Other Ambulatory Visit: Payer: Self-pay

## 2019-05-13 ENCOUNTER — Encounter
Admission: RE | Disposition: A | Discharge status: Home / Self Care | Payer: Self-pay | Source: Ambulatory Visit | Attending: General Surgery

## 2019-05-13 ENCOUNTER — Encounter (HOSPITAL_BASED_OUTPATIENT_CLINIC_OR_DEPARTMENT_OTHER)
Admission: RE | Admit: 2019-05-13 | Discharge: 2019-05-13 | Disposition: A | Discharge status: Home / Self Care | Payer: PPO | Source: Ambulatory Visit | Attending: General Surgery | Admitting: General Surgery

## 2019-05-13 ENCOUNTER — Encounter: Payer: Self-pay | Admitting: General Surgery

## 2019-05-13 DIAGNOSIS — Z419 Encounter for procedure for purposes other than remedying health state, unspecified: Secondary | ICD-10-CM

## 2019-05-13 DIAGNOSIS — Z7982 Long term (current) use of aspirin: Secondary | ICD-10-CM | POA: Diagnosis not present

## 2019-05-13 DIAGNOSIS — C50411 Malignant neoplasm of upper-outer quadrant of right female breast: Secondary | ICD-10-CM | POA: Diagnosis not present

## 2019-05-13 DIAGNOSIS — Z79890 Hormone replacement therapy (postmenopausal): Secondary | ICD-10-CM | POA: Insufficient documentation

## 2019-05-13 DIAGNOSIS — E890 Postprocedural hypothyroidism: Secondary | ICD-10-CM | POA: Insufficient documentation

## 2019-05-13 DIAGNOSIS — Z880 Allergy status to penicillin: Secondary | ICD-10-CM | POA: Insufficient documentation

## 2019-05-13 DIAGNOSIS — Z7689 Persons encountering health services in other specified circumstances: Secondary | ICD-10-CM | POA: Diagnosis not present

## 2019-05-13 DIAGNOSIS — I10 Essential (primary) hypertension: Secondary | ICD-10-CM | POA: Diagnosis not present

## 2019-05-13 DIAGNOSIS — Z79899 Other long term (current) drug therapy: Secondary | ICD-10-CM | POA: Diagnosis not present

## 2019-05-13 DIAGNOSIS — C50911 Malignant neoplasm of unspecified site of right female breast: Secondary | ICD-10-CM | POA: Diagnosis not present

## 2019-05-13 HISTORY — PX: BREAST LUMPECTOMY WITH SENTINEL LYMPH NODE BIOPSY: SHX5597

## 2019-05-13 HISTORY — PX: BREAST LUMPECTOMY: SHX2

## 2019-05-13 SURGERY — BREAST LUMPECTOMY WITH SENTINEL LYMPH NODE BX
Anesthesia: General | Laterality: Right

## 2019-05-13 MED ORDER — KETOROLAC TROMETHAMINE 30 MG/ML IJ SOLN
INTRAMUSCULAR | Status: DC | PRN
  Administered 2019-05-13: 30 mg via INTRAVENOUS

## 2019-05-13 MED ORDER — ACETAMINOPHEN 10 MG/ML IV SOLN
INTRAVENOUS | Status: AC
  Filled 2019-05-13: qty 100

## 2019-05-13 MED ORDER — CHLORHEXIDINE GLUCONATE CLOTH 2 % EX PADS: 6.0000 | MEDICATED_PAD | Freq: Once | CUTANEOUS | Status: DC

## 2019-05-13 MED ORDER — DEXAMETHASONE SODIUM PHOSPHATE 10 MG/ML IJ SOLN
INTRAMUSCULAR | Status: DC | PRN
  Administered 2019-05-13: 10 mg via INTRAVENOUS

## 2019-05-13 MED ORDER — LIDOCAINE HCL (CARDIAC) PF 100 MG/5ML IV SOSY
PREFILLED_SYRINGE | INTRAVENOUS | Status: DC | PRN
  Administered 2019-05-13: 60 mg via INTRAVENOUS

## 2019-05-13 MED ORDER — OXYCODONE HCL 5 MG/5ML PO SOLN: 5.0000 mg | Freq: Once | ORAL | Status: DC | PRN

## 2019-05-13 MED ORDER — EPHEDRINE SULFATE 50 MG/ML IJ SOLN
INTRAMUSCULAR | Status: DC | PRN
  Administered 2019-05-13: 5 mg via INTRAVENOUS

## 2019-05-13 MED ORDER — PROPOFOL 10 MG/ML IV BOLUS
INTRAVENOUS | Status: DC | PRN
  Administered 2019-05-13: 120 mg via INTRAVENOUS
  Administered 2019-05-13: 30 mg via INTRAVENOUS

## 2019-05-13 MED ORDER — ACETAMINOPHEN 10 MG/ML IV SOLN: 1000.0000 mg | Freq: Once | INTRAVENOUS | Status: DC | PRN

## 2019-05-13 MED ORDER — METHYLENE BLUE 0.5 % INJ SOLN
INTRAVENOUS | Status: AC
  Filled 2019-05-13: qty 10

## 2019-05-13 MED ORDER — BUPIVACAINE-EPINEPHRINE (PF) 0.5% -1:200000 IJ SOLN
INTRAMUSCULAR | Status: AC
  Filled 2019-05-13: qty 30

## 2019-05-13 MED ORDER — BUPIVACAINE-EPINEPHRINE (PF) 0.5% -1:200000 IJ SOLN
INTRAMUSCULAR | Status: DC | PRN
  Administered 2019-05-13: 20 mL via PERINEURAL
  Administered 2019-05-13: 10 mL via PERINEURAL

## 2019-05-13 MED ORDER — FAMOTIDINE 20 MG PO TABS
ORAL_TABLET | ORAL | Status: AC
  Administered 2019-05-13: 20 mg via ORAL
  Filled 2019-05-13: qty 1

## 2019-05-13 MED ORDER — ONDANSETRON HCL 4 MG/2ML IJ SOLN
INTRAMUSCULAR | Status: DC | PRN
  Administered 2019-05-13: 4 mg via INTRAVENOUS

## 2019-05-13 MED ORDER — MIDAZOLAM HCL 2 MG/2ML IJ SOLN
INTRAMUSCULAR | Status: AC
  Filled 2019-05-13: qty 2

## 2019-05-13 MED ORDER — TECHNETIUM TC 99M SULFUR COLLOID FILTERED
1.1000 | Freq: Once | INTRAVENOUS | Status: AC | PRN
  Administered 2019-05-13: 09:00:00 1.1 via INTRADERMAL

## 2019-05-13 MED ORDER — MIDAZOLAM HCL 2 MG/2ML IJ SOLN
INTRAMUSCULAR | Status: DC | PRN
  Administered 2019-05-13: 2 mg via INTRAVENOUS

## 2019-05-13 MED ORDER — ONDANSETRON HCL 4 MG/2ML IJ SOLN: 4.0000 mg | Freq: Once | INTRAMUSCULAR | Status: DC | PRN

## 2019-05-13 MED ORDER — METHYLENE BLUE 0.5 % INJ SOLN
INTRAVENOUS | Status: DC | PRN
  Administered 2019-05-13: 5 mL via SUBMUCOSAL

## 2019-05-13 MED ORDER — HYDROCODONE-ACETAMINOPHEN 5-325 MG PO TABS: 1.0000 | ORAL_TABLET | ORAL | 0 refills | Status: DC | PRN

## 2019-05-13 MED ORDER — GLYCOPYRROLATE 0.2 MG/ML IJ SOLN
INTRAMUSCULAR | Status: DC | PRN
  Administered 2019-05-13: .2 mg via INTRAVENOUS

## 2019-05-13 MED ORDER — ACETAMINOPHEN 10 MG/ML IV SOLN
INTRAVENOUS | Status: DC | PRN
  Administered 2019-05-13: 1000 mg via INTRAVENOUS

## 2019-05-13 MED ORDER — FENTANYL CITRATE (PF) 100 MCG/2ML IJ SOLN
INTRAMUSCULAR | Status: DC | PRN
  Administered 2019-05-13 (×4): 25 ug via INTRAVENOUS

## 2019-05-13 MED ORDER — OXYCODONE HCL 5 MG PO TABS
5.0000 mg | ORAL_TABLET | Freq: Once | ORAL | Status: DC | PRN
  Filled 2019-05-13: qty 1

## 2019-05-13 MED ORDER — FAMOTIDINE 20 MG PO TABS: 20.0000 mg | ORAL_TABLET | Freq: Once | ORAL | Status: AC

## 2019-05-13 MED ORDER — LACTATED RINGERS IV SOLN: INTRAVENOUS | Status: DC | PRN

## 2019-05-13 MED ORDER — FENTANYL CITRATE (PF) 100 MCG/2ML IJ SOLN
INTRAMUSCULAR | Status: AC
  Administered 2019-05-13: 25 ug via INTRAVENOUS
  Filled 2019-05-13: qty 2

## 2019-05-13 MED ORDER — FENTANYL CITRATE (PF) 100 MCG/2ML IJ SOLN
25.0000 ug | INTRAMUSCULAR | Status: DC | PRN
  Administered 2019-05-13: 25 ug via INTRAVENOUS

## 2019-05-13 MED ORDER — PHENYLEPHRINE HCL (PRESSORS) 10 MG/ML IV SOLN
INTRAVENOUS | Status: DC | PRN
  Administered 2019-05-13 (×4): 100 ug via INTRAVENOUS

## 2019-05-13 MED ORDER — LACTATED RINGERS IV SOLN: INTRAVENOUS | Status: DC

## 2019-05-13 MED ORDER — FENTANYL CITRATE (PF) 100 MCG/2ML IJ SOLN
INTRAMUSCULAR | Status: AC
  Filled 2019-05-13: qty 2

## 2019-05-13 SURGICAL SUPPLY — 57 items
BINDER BREAST LRG (GAUZE/BANDAGES/DRESSINGS) IMPLANT
BINDER BREAST MEDIUM (GAUZE/BANDAGES/DRESSINGS) ×1 IMPLANT
BINDER BREAST XLRG (GAUZE/BANDAGES/DRESSINGS) IMPLANT
BINDER BREAST XXLRG (GAUZE/BANDAGES/DRESSINGS) IMPLANT
BLADE BOVIE TIP EXT 4 (BLADE) ×1 IMPLANT
BLADE SURG 15 STRL SS SAFETY (BLADE) ×3 IMPLANT
BULB RESERV EVAC DRAIN JP 100C (MISCELLANEOUS) IMPLANT
CANISTER SUCT 1200ML W/VALVE (MISCELLANEOUS) ×2 IMPLANT
CHLORAPREP W/TINT 26 (MISCELLANEOUS) ×2 IMPLANT
CNTNR SPEC 2.5X3XGRAD LEK (MISCELLANEOUS)
CONT SPEC 4OZ STER OR WHT (MISCELLANEOUS)
CONTAINER SPEC 2.5X3XGRAD LEK (MISCELLANEOUS) IMPLANT
COVER PROBE FLX POLY STRL (MISCELLANEOUS) ×2 IMPLANT
COVER WAND RF STERILE (DRAPES) ×1 IMPLANT
DEVICE DUBIN SPECIMEN MAMMOGRA (MISCELLANEOUS) ×2 IMPLANT
DRAIN CHANNEL JP 15F RND 16 (MISCELLANEOUS) IMPLANT
DRAPE LAPAROTOMY TRNSV 106X77 (MISCELLANEOUS) ×2 IMPLANT
DRSG GAUZE FLUFF 36X18 (GAUZE/BANDAGES/DRESSINGS) ×4 IMPLANT
DRSG TELFA 3X8 NADH (GAUZE/BANDAGES/DRESSINGS) ×2 IMPLANT
ELECT CAUTERY BLADE TIP 2.5 (TIP) ×2
ELECT REM PT RETURN 9FT ADLT (ELECTROSURGICAL) ×2
ELECTRODE CAUTERY BLDE TIP 2.5 (TIP) ×1 IMPLANT
ELECTRODE REM PT RTRN 9FT ADLT (ELECTROSURGICAL) ×1 IMPLANT
GAUZE SPONGE 4X4 12PLY STRL (GAUZE/BANDAGES/DRESSINGS) ×2 IMPLANT
GLOVE BIO SURGEON STRL SZ7.5 (GLOVE) ×3 IMPLANT
GLOVE INDICATOR 8.0 STRL GRN (GLOVE) ×3 IMPLANT
GOWN STRL REUS W/ TWL LRG LVL3 (GOWN DISPOSABLE) ×2 IMPLANT
GOWN STRL REUS W/TWL LRG LVL3 (GOWN DISPOSABLE) ×2
KIT TURNOVER KIT A (KITS) ×2 IMPLANT
LABEL OR SOLS (LABEL) ×2 IMPLANT
MARGIN MAP 10MM (MISCELLANEOUS) ×2 IMPLANT
NDL HYPO 25X1 1.5 SAFETY (NEEDLE) ×2 IMPLANT
NEEDLE HYPO 22GX1.5 SAFETY (NEEDLE) ×2 IMPLANT
NEEDLE HYPO 25X1 1.5 SAFETY (NEEDLE) ×2 IMPLANT
PACK BASIN MINOR ARMC (MISCELLANEOUS) ×2 IMPLANT
PAD DRESSING TELFA 3X8 NADH (GAUZE/BANDAGES/DRESSINGS) ×1 IMPLANT
RETRACTOR RING XSMALL (MISCELLANEOUS) IMPLANT
RTRCTR WOUND ALEXIS 13CM XS SH (MISCELLANEOUS) ×2
SHEARS FOC LG CVD HARMONIC 17C (MISCELLANEOUS) IMPLANT
SHEARS HARMONIC 9CM CVD (BLADE) IMPLANT
SLEVE PROBE SENORX GAMMA FIND (MISCELLANEOUS) IMPLANT
STRIP CLOSURE SKIN 1/2X4 (GAUZE/BANDAGES/DRESSINGS) ×2 IMPLANT
SUT ETHILON 3-0 FS-10 30 BLK (SUTURE)
SUT SILK 2 0 (SUTURE) ×1
SUT SILK 2-0 18XBRD TIE 12 (SUTURE) ×1 IMPLANT
SUT VIC AB 2-0 CT1 27 (SUTURE) ×4
SUT VIC AB 2-0 CT1 TAPERPNT 27 (SUTURE) ×3 IMPLANT
SUT VIC AB 3-0 SH 27 (SUTURE) ×1
SUT VIC AB 3-0 SH 27X BRD (SUTURE) ×2 IMPLANT
SUT VIC AB 4-0 FS2 27 (SUTURE) ×5 IMPLANT
SUT VICRYL+ 3-0 144IN (SUTURE) ×2 IMPLANT
SUTURE EHLN 3-0 FS-10 30 BLK (SUTURE) ×1 IMPLANT
SWABSTK COMLB BENZOIN TINCTURE (MISCELLANEOUS) ×2 IMPLANT
SYR 10ML LL (SYRINGE) ×2 IMPLANT
SYR BULB IRRIG 60ML STRL (SYRINGE) ×2 IMPLANT
TAPE TRANSPORE STRL 2 31045 (GAUZE/BANDAGES/DRESSINGS) ×1 IMPLANT
WATER STERILE IRR 1000ML POUR (IV SOLUTION) ×2 IMPLANT

## 2019-05-13 NOTE — Anesthesia Procedure Notes (Signed)
Procedure Name: LMA Insertion Performed by: Fletcher-Harrison, Joycelyn Liska, CRNA Pre-anesthesia Checklist: Patient identified, Emergency Drugs available, Suction available and Patient being monitored Patient Re-evaluated:Patient Re-evaluated prior to induction Oxygen Delivery Method: Circle system utilized Preoxygenation: Pre-oxygenation with 100% oxygen Induction Type: IV induction Ventilation: Mask ventilation without difficulty LMA: LMA inserted LMA Size: 3.0 Number of attempts: 1 Placement Confirmation: positive ETCO2,  CO2 detector and breath sounds checked- equal and bilateral Tube secured with: Tape Dental Injury: Teeth and Oropharynx as per pre-operative assessment        

## 2019-05-13 NOTE — Anesthesia Postprocedure Evaluation (Signed)
Anesthesia Post Note  Patient: Jennifer Clayton  Procedure(s) Performed: BREAST LUMPECTOMY WITH SENTINEL LYMPH NODE BX (Right )  Patient location during evaluation: PACU Anesthesia Type: General Level of consciousness: awake and alert Pain management: pain level controlled Vital Signs Assessment: post-procedure vital signs reviewed and stable Respiratory status: spontaneous breathing, nonlabored ventilation, respiratory function stable and patient connected to nasal cannula oxygen Cardiovascular status: blood pressure returned to baseline and stable Postop Assessment: no apparent nausea or vomiting Anesthetic complications: no     Last Vitals:  Vitals:   05/13/19 1356 05/13/19 1424  BP: 129/63 135/72  Pulse: 74 71  Resp: 16 16  Temp: (!) 36.4 C   SpO2: 97% 99%    Last Pain:  Vitals:   05/13/19 1424  TempSrc:   PainSc: 4                  Arita Miss

## 2019-05-13 NOTE — Op Note (Signed)
Preoperative diagnosis: Right upper outer quadrant breast cancer.  Postoperative diagnosis: Same.  Operative procedure: Right breast wide excision with tissue transfer, sentinel node biopsy, ultrasound guidance.  Operating surgeon: Hervey Ard, MD.  Anesthesia: General by LMA, Marcaine 0.5% with 1: 200,000 units of epinephrine, 30 cc.  Clinical note: This 64 year old woman recently had a mammogram showing a new density in the upper outer quadrant.  Subsequent vacuum biopsy showed evidence of a 4 mm invasive mammary cancer.  The patient desired breast conservation.  She went underwent injection with technetium sulfur colloid prior to presentation to the operating theater.  She had SCD stockings for DVT prevention.  Antibiotics were not indicated.  Operative note: With the patient under adequate general anesthesia the area around the nipple areolar complex was cleansed with alcohol and a total of 5 cc of 0.5% methylene blue was instilled in the subareolar plexus.  The breast was then cleansed with ChloraPrep and draped.  Ultrasound was used to localize the previously biopsied nodule in the 11 o'clock position about 4 cm from the nipple.  The patient's areola was fairly small but it was elected to make use of a circumareolar incision from the 8 to 2 o'clock position.  The local anesthesia had been infiltrated prior to skin incision.  The skin was incised sharply and the remaining dissection completed with electrocautery.  The mass was within about a centimeter of the skin and the adipose tissue overlying the breast parenchyma was divided with cautery a 3 x 3 x 3 cm block of tissue was orientated and specimen radiograph showed the clip within the center of the specimen.  Clear margins were confirmed by Allena Napoleon, MD from pathology.  While the breast specimen was being processed attention was turned to the axilla.  The node seeker device showed the area of maximum uptake at about the midportion of  the axilla.  Local anesthesia was infiltrated and a 3-4 cm transverse incision was made.  The skin was incised sharply and remaining dissection completed electrocautery.  The axillary envelope was opened and a blue, nonhot node was identified with a single blue lymphatic draped over the lesion.  2 additional hot, nonblue nodes were identified.  Good hemostasis with 3-0 Vicryl ties and electrocautery.  The axillary envelope was closed with 2-0 Vicryl figure-of-eight sutures to help obliterate dead space.  Subcutaneous fat was approximated with interrupted 2-0 Vicryl sutures.  The skin was closed with a running 4-0 Vicryl subcuticular suture.  The breast wound was then approached.  The breast parenchyma was elevated off the underlying pectoralis muscle circumferentially for a distance of about 5 cm.  This was then approximated with interrupted 2-0 Vicryl figure-of-eight sutures.  The skin and subcutaneous fat was elevated off the underlying breast parenchyma in a similar fashion and the breast parenchyma then approximated with interrupted 2-0 Vicryl figure-of-eight sutures.  The adipose layer along the circumareolar incision was closed with interrupted simple sutures and the skin closed with interrupted simple 4-0 Vicryl subcuticular sutures.  Benzoin and Steri-Strips were applied to both wounds.  Telfa, fluff gauze and a compressive wrap were applied.  The patient tolerated the procedure well and was taken to recovery in stable condition.

## 2019-05-13 NOTE — OR Nursing (Signed)
Secure chat, per Dr. Bary Castilla, patient may resume aspirin today.

## 2019-05-13 NOTE — Discharge Instructions (Signed)

## 2019-05-13 NOTE — Anesthesia Preprocedure Evaluation (Signed)
Anesthesia Evaluation  Patient identified by MRN, date of birth, ID band Patient awake    Reviewed: Allergy & Precautions, NPO status , Patient's Chart, lab work & pertinent test results  History of Anesthesia Complications Negative for: history of anesthetic complications  Airway Mallampati: II  TM Distance: >3 FB Neck ROM: Full    Dental no notable dental hx. (+) Teeth Intact, Dental Advisory Given   Pulmonary neg pulmonary ROS, neg sleep apnea, neg COPD, Patient abstained from smoking.Not current smoker,    Pulmonary exam normal breath sounds clear to auscultation       Cardiovascular Exercise Tolerance: Good METShypertension, Pt. on medications (-) CAD and (-) Past MI (-) dysrhythmias  Rhythm:Regular Rate:Normal - Systolic murmurs    Neuro/Psych  Headaches, negative psych ROS   GI/Hepatic neg GERD  ,(+)     (-) substance abuse  ,   Endo/Other  neg diabetesHypothyroidism   Renal/GU negative Renal ROS     Musculoskeletal   Abdominal   Peds  Hematology   Anesthesia Other Findings Past Medical History: No date: Cancer ([place])     Comment:  skin cancer (left leg)/BREAST CANCER No date: H/O radioactive iodine thyroid ablation No date: Headache     Comment:  MIGRAINES No date: Hypertension No date: Hypothyroidism  Reproductive/Obstetrics                             Anesthesia Physical Anesthesia Plan  ASA: II  Anesthesia Plan: General   Post-op Pain Management:    Induction: Intravenous  PONV Risk Score and Plan: 3 and Ondansetron, Dexamethasone and Treatment may vary due to age or medical condition  Airway Management Planned: LMA  Additional Equipment: None  Intra-op Plan:   Post-operative Plan: Extubation in OR  Informed Consent: I have reviewed the patients History and Physical, chart, labs and discussed the procedure including the risks, benefits and alternatives for  the proposed anesthesia with the patient or authorized representative who has indicated his/her understanding and acceptance.     Dental advisory given  Plan Discussed with: CRNA and Surgeon  Anesthesia Plan Comments: (Discussed risks of anesthesia with patient, including PONV, sore throat, lip/dental damage. Rare risks discussed as well, such as cardiorespiratory and neurological sequelae. Patient understands.)        Anesthesia Quick Evaluation

## 2019-05-13 NOTE — OR Nursing (Signed)
Per Dr. Bary Castilla, secure-chat, may discharge patient to home without his visit to postop.

## 2019-05-13 NOTE — H&P (Signed)
Jennifer Clayton [id number] 01/18/1956     HPI:  64 year old with recently diagnosed right breast cancer who desires breast conservation.  Tolerated SLN injection with modest discomfort.   Medications Prior to Admission  Medication Sig Dispense Refill Last Dose  . amLODipine (NORVASC) 5 MG tablet Take 1 tablet (5 mg total) by mouth daily. (Patient taking differently: Take 5 mg by mouth at bedtime. ) 90 tablet 3 05/12/2019 at Unknown time  . Ascorbic Acid (VITAMIN C PO) Take 1 tablet by mouth daily.   05/12/2019 at Unknown time  . aspirin EC 81 MG tablet Take 81 mg by mouth every other day.   05/03/2019  . buPROPion (WELLBUTRIN XL) 300 MG 24 hr tablet Take 1 tablet (300 mg total) by mouth daily. (Patient taking differently: Take 300 mg by mouth every morning. ) 90 tablet 1 05/12/2019 at Unknown time  . levothyroxine (SYNTHROID) 100 MCG tablet TAKE 1 TABLET EVERY DAY ON EMPTY STOMACHWITH A GLASS OF WATER AT LEAST 30-60 MINBEFORE BREAKFAST (Patient taking differently: Take 100 mcg by mouth daily before breakfast. ) 90 tablet 0 05/12/2019 at Unknown time  . Multiple Vitamins-Minerals (MULTIVITAMIN ADULT PO) Take 1 tablet by mouth daily.   05/12/2019 at Unknown time  . SUMAtriptan (IMITREX) 50 MG tablet TAKE 1 TABLET BY MOUTH ONCE-MAY REPEAT ONCE IN 2 HOURS IF NECESSARY (Patient taking differently: Take 12.5 mg by mouth every 2 (two) hours as needed for migraine or headache. ) 15 tablet 2 05/08/2019  . zolpidem (AMBIEN) 5 MG tablet TAKE 1 TABLET BY MOUTH AT BEDTIME (Patient taking differently: Take 2.5 mg by mouth at bedtime. ) 30 tablet 1 05/12/2019 at Unknown time   Allergies  Allergen Reactions  . Zomepirac Anaphylaxis    Zomax - reaction over 20 years ago  . Amoxicillin-Pot Clavulanate Other (See Comments)    HA  . Other Other (See Comments)    Intolerant of multiple food and fruits   Past Medical History:  Diagnosis Date  . Cancer ([facility])    skin cancer (left leg)/BREAST CANCER  . H/O radioactive  iodine thyroid ablation   . Headache    MIGRAINES  . Hypertension   . Hypothyroidism    Past Surgical History:  Procedure Laterality Date  . ABDOMINAL HYSTERECTOMY     Age 51, for bleeding and cyst; NO ovaries  . VAGINAL DELIVERY     Social History   Socioeconomic History  . Marital status: Married    Spouse name: Not on file  . Number of children: Not on file  . Years of education: Not on file  . Highest education level: Not on file  Occupational History  . Not on file  Tobacco Use  . Smoking status: Never Smoker  . Smokeless tobacco: Never Used  Substance and Sexual Activity  . Alcohol use: Yes    Alcohol/week: 1.0 standard drinks    Types: 1 Glasses of wine per week    Comment: rare  . Drug use: Never  . Sexual activity: Not on file  Other Topics Concern  . Not on file  Social History Narrative   Lives in [place] with husband and son and 4 grandchildren.      Work - Full time in [employer] - regular diet      Exercise - typically daily at [facility] for 45-55min, lifting some weight, ellipitical, walks.       Social Determinants of Radio broadcast assistant  Strain:   . Difficulty of Paying Living Expenses:   Food Insecurity:   . Worried About Charity fundraiser in the Last Year:   . Arboriculturist in the Last Year:   Transportation Needs:   . Film/video editor (Medical):   Marland Kitchen Lack of Transportation (Non-Medical):   Physical Activity:   . Days of Exercise per Week:   . Minutes of Exercise per Session:   Stress:   . Feeling of Stress :   Social Connections:   . Frequency of Communication with Friends and Family:   . Frequency of Social Gatherings with Friends and Family:   . Attends Religious Services:   . Active Member of Clubs or Organizations:   . Attends Archivist Meetings:   Marland Kitchen Marital Status:   Intimate Partner Violence:   . Fear of Current or Ex-Partner:   . Emotionally Abused:   Marland Kitchen Physically Abused:   . Sexually  Abused:    Social History   Social History Narrative   Lives in [place] with husband and son and 4 grandchildren.      Work - Full time in [employer] - regular diet      Exercise - typically daily at [facility] for 45-46min, lifting some weight, ellipitical, walks.         ROS: Negative.     PE: HEENT: Negative. Lungs: Clear. Cardio: RR.  Assessment/Plan:  Proceed with planned wide excision, SLN biopsy.    Forest Gleason [facility] 05/13/2019

## 2019-05-13 NOTE — Transfer of Care (Signed)
Immediate Anesthesia Transfer of Care Note  Patient: Jennifer Clayton  Procedure(s) Performed: BREAST LUMPECTOMY WITH SENTINEL LYMPH NODE BX (Right )  Patient Location: PACU  Anesthesia Type:General  Level of Consciousness: awake, alert , oriented and patient cooperative  Airway & Oxygen Therapy: Patient Spontanous Breathing and Patient connected to face mask oxygen  Post-op Assessment: Report given to RN and Post -op Vital signs reviewed and stable  Post vital signs: Reviewed and stable  Last Vitals:  Vitals Value Taken Time  BP 129/73 05/13/19 1251  Temp 36.6 C 05/13/19 1251  Pulse 96 05/13/19 1252  Resp 13 05/13/19 1252  SpO2 95 % 05/13/19 1252  Vitals shown include unvalidated device data.  Last Pain:  Vitals:   05/13/19 0926  TempSrc: Tympanic  PainSc: 0-No pain         Complications: No apparent anesthesia complications

## 2019-05-17 LAB — SURGICAL PATHOLOGY

## 2019-05-20 NOTE — Progress Notes (Signed)
Initiated navigation.  Patient scheduled with Dr. Rogue Bussing, and Dr. Baruch Gouty. Will accompany to Med/Onc initial appointment on 05/22/19.

## 2019-05-21 ENCOUNTER — Encounter: Payer: Self-pay | Admitting: Internal Medicine

## 2019-05-21 ENCOUNTER — Other Ambulatory Visit: Payer: Self-pay

## 2019-05-21 NOTE — Progress Notes (Signed)
New patient contacted. Newly dx breast cancer. She has no medical complaints.

## 2019-05-22 ENCOUNTER — Encounter: Payer: Self-pay | Admitting: Internal Medicine

## 2019-05-22 ENCOUNTER — Inpatient Hospital Stay: Payer: PPO | Attending: Internal Medicine | Admitting: Internal Medicine

## 2019-05-22 ENCOUNTER — Inpatient Hospital Stay: Payer: PPO

## 2019-05-22 DIAGNOSIS — Z803 Family history of malignant neoplasm of breast: Secondary | ICD-10-CM | POA: Insufficient documentation

## 2019-05-22 DIAGNOSIS — E039 Hypothyroidism, unspecified: Secondary | ICD-10-CM | POA: Diagnosis not present

## 2019-05-22 DIAGNOSIS — C50411 Malignant neoplasm of upper-outer quadrant of right female breast: Secondary | ICD-10-CM | POA: Diagnosis not present

## 2019-05-22 DIAGNOSIS — I10 Essential (primary) hypertension: Secondary | ICD-10-CM | POA: Insufficient documentation

## 2019-05-22 DIAGNOSIS — Z170 Estrogen receptor positive status [ER+]: Secondary | ICD-10-CM | POA: Insufficient documentation

## 2019-05-22 DIAGNOSIS — Z7982 Long term (current) use of aspirin: Secondary | ICD-10-CM | POA: Diagnosis not present

## 2019-05-22 DIAGNOSIS — Z809 Family history of malignant neoplasm, unspecified: Secondary | ICD-10-CM | POA: Diagnosis not present

## 2019-05-22 DIAGNOSIS — M810 Age-related osteoporosis without current pathological fracture: Secondary | ICD-10-CM | POA: Insufficient documentation

## 2019-05-22 DIAGNOSIS — Z79899 Other long term (current) drug therapy: Secondary | ICD-10-CM | POA: Diagnosis not present

## 2019-05-22 NOTE — Assessment & Plan Note (Signed)
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  Status post lumpectomy.   #Recommend Oncotype to decide on systemic chemotherapy.  Clinically less likely patient would need systemic chemotherapy. Agree with radiation; appointment Dr. Donella Stade next week.   #Discussed the use of antihormone therapy like letrozole; #Discussed the mechanism of action of aromatase inhibitors-with blocking of estrogen to prevent breast cancer.  Also discussed the potential side effects including but not limited to arthralgias hot flashes and increased risk of osteoporosis [see below]  # OSTEOPOROSIS [BMD-2021]-T score -3; continue calcium plus vitamin D plus exercise program.  However patient would benefit from either Zometa/Prolia.  This could be started in the next few months.  Thank you Dr. Tollie Pizza.  For allowing me to participate in the care of your pleasant patient. Please do not hesitate to contact me with questions or concerns in the interim.  Discussed with Dr. Tollie Pizza.  Also, Koren Shiver nurse navigator available.   # DISPOSITION: # follow up in 3 weeks; MD; No labs- Dr.B

## 2019-05-22 NOTE — Progress Notes (Signed)
Navigated during initial MED/ONC consult.  Oncotype DX to be submitted.  Patient already has consult with Dr. Baruch Gouty scheduled for 05/29/19.  Reviewed care plan.

## 2019-05-22 NOTE — Progress Notes (Signed)
[facility] [name] NOTE  Patient Care Team: Burnard Hawthorne, FNP as PCP - General (Family Medicine) Theodore Demark, RN as Oncology Nurse Navigator  CHIEF COMPLAINTS/PURPOSE OF CONSULTATION: Breast cancer  #  Oncology History Overview Note  # MARCH 2021-right breast 11 o'clock position-s/p lumpectomy; SLNBx [Dr. Tollie Pizza ]pT1c (15 mm) p N0-grade-1; ER/PR positive HER-2 negative  # SURVIVORSHIP:   # GENETICS:   DIAGNOSIS:   STAGE:         ;  GOALS:  CURRENT/MOST RECENT THERAPY :     Carcinoma of upper-outer quadrant of right breast in female, estrogen receptor positive ([place])  05/22/2019 Initial Diagnosis   Carcinoma of upper-outer quadrant of right breast in female, estrogen receptor positive ([place])      HISTORY OF PRESENTING ILLNESS:  Jennifer Clayton 64 y.o.  female female with no prior history of breast cancer/or malignancies has been referred to [place] for further evaluation recommendations for new diagnosis of breast cancer.   Patient states she was found to have an abnormal screening mammogram which led to diagnostic mammogram/ultrasound/followed by biopsy-as summarized above.  Family history of breast cancer: none Family history of other cancers:  Used estrogen and progesterone therapy: none Previous biopsy: none   Review of Systems  Constitutional: Negative for chills, diaphoresis, fever, malaise/fatigue and weight loss.  HENT: Negative for nosebleeds and sore throat.   Eyes: Negative for double vision.  Respiratory: Negative for cough, hemoptysis, sputum production, shortness of breath and wheezing.   Cardiovascular: Negative for chest pain, palpitations, orthopnea and leg swelling.  Gastrointestinal: Negative for abdominal pain, blood in stool, constipation, diarrhea, heartburn, melena, nausea and vomiting.  Genitourinary: Negative for dysuria, frequency and urgency.  Musculoskeletal: Negative for back pain and joint pain.  Skin: Negative.   Negative for itching and rash.  Neurological: Negative for dizziness, tingling, focal weakness, weakness and headaches.  Endo/Heme/Allergies: Does not bruise/bleed easily.  Psychiatric/Behavioral: Negative for depression. The patient is not nervous/anxious and does not have insomnia.      MEDICAL HISTORY:  Past Medical History:  Diagnosis Date  . Cancer ([place])    skin cancer (left leg)/BREAST CANCER  . H/O radioactive iodine thyroid ablation   . Headache    MIGRAINES  . Hypertension   . Hypothyroidism     SURGICAL HISTORY: Past Surgical History:  Procedure Laterality Date  . ABDOMINAL HYSTERECTOMY     Age 65, for bleeding and cyst; NO ovaries  . BREAST LUMPECTOMY WITH SENTINEL LYMPH NODE BIOPSY Right 05/13/2019   Procedure: BREAST LUMPECTOMY WITH SENTINEL LYMPH NODE BX;  Surgeon: Robert Bellow, MD;  Location: [facility] ORS;  Service: General;  Laterality: Right;  Marland Kitchen VAGINAL DELIVERY      SOCIAL HISTORY: Social History   Socioeconomic History  . Marital status: Married    Spouse name: Not on file  . Number of children: Not on file  . Years of education: Not on file  . Highest education level: Not on file  Occupational History  . Not on file  Tobacco Use  . Smoking status: Never Smoker  . Smokeless tobacco: Never Used  Substance and Sexual Activity  . Alcohol use: Yes    Alcohol/week: 1.0 standard drinks    Types: 1 Glasses of wine per week    Comment: rare  . Drug use: Never  . Sexual activity: Not on file  Other Topics Concern  . Not on file  Social History Narrative   Lives in [place] with husband and  son and 4 grandchildren.      Work - Full time in [employer] - regular diet      Exercise - typically daily at [facility] for 45-34mn, lifting some weight, ellipitical, walks.       Never smoked; social alcohol.       Social Determinants of Health   Financial Resource Strain:   . Difficulty of Paying Living Expenses:   Food Insecurity:   .  Worried About RCharity fundraiserin the Last Year:   . RArboriculturistin the Last Year:   Transportation Needs:   . LFilm/video editor(Medical):   .Marland KitchenLack of Transportation (Non-Medical):   Physical Activity:   . Days of Exercise per Week:   . Minutes of Exercise per Session:   Stress:   . Feeling of Stress :   Social Connections:   . Frequency of Communication with Friends and Family:   . Frequency of Social Gatherings with Friends and Family:   . Attends Religious Services:   . Active Member of Clubs or Organizations:   . Attends CArchivistMeetings:   .Marland KitchenMarital Status:   Intimate Partner Violence:   . Fear of Current or Ex-Partner:   . Emotionally Abused:   .Marland KitchenPhysically Abused:   . Sexually Abused:     FAMILY HISTORY: Family History  Problem Relation Age of Onset  . Cancer Mother        lung, non-smoker  . Dementia Father        related to traumatic brain injury  . Breast cancer Neg Hx     ALLERGIES:  is allergic to zomepirac; amoxicillin-pot clavulanate; and other.  MEDICATIONS:  Current Outpatient Medications  Medication Sig Dispense Refill  . amLODipine (NORVASC) 5 MG tablet Take 1 tablet (5 mg total) by mouth daily. (Patient taking differently: Take 5 mg by mouth at bedtime. ) 90 tablet 3  . Ascorbic Acid (VITAMIN C PO) Take 1 tablet by mouth daily.    .Marland Kitchenaspirin EC 81 MG tablet Take 81 mg by mouth every other day.    .Marland KitchenbuPROPion (WELLBUTRIN XL) 300 MG 24 hr tablet Take 1 tablet (300 mg total) by mouth daily. (Patient taking differently: Take 300 mg by mouth every morning. ) 90 tablet 1  . levothyroxine (SYNTHROID) 100 MCG tablet TAKE 1 TABLET EVERY DAY ON EMPTY STOMACHWITH A GLASS OF WATER AT LEAST 30-60 MINBEFORE BREAKFAST (Patient taking differently: Take 100 mcg by mouth daily before breakfast. ) 90 tablet 0  . Multiple Vitamins-Minerals (MULTIVITAMIN ADULT PO) Take 1 tablet by mouth daily.    . SUMAtriptan (IMITREX) 50 MG tablet TAKE 1 TABLET BY  MOUTH ONCE-MAY REPEAT ONCE IN 2 HOURS IF NECESSARY (Patient taking differently: Take 12.5 mg by mouth every 2 (two) hours as needed for migraine or headache. ) 15 tablet 2  . zolpidem (AMBIEN) 5 MG tablet TAKE 1 TABLET BY MOUTH AT BEDTIME (Patient taking differently: Take 2.5 mg by mouth at bedtime. ) 30 tablet 1  . HYDROcodone-acetaminophen (NORCO/VICODIN) 5-325 MG tablet Take 1 tablet by mouth every 4 (four) hours as needed for moderate pain. (Patient not taking: Reported on 05/21/2019) 10 tablet 0   No current facility-administered medications for this visit.      .Marland Kitchen PHYSICAL EXAMINATION: ECOG PERFORMANCE STATUS: 0 - Asymptomatic  Vitals:   05/21/19 1500  BP: 137/84  Pulse: 68  Temp: (!) 96.9 F (36.1  C)   Filed Weights   05/21/19 1500  Weight: 131 lb 8 oz (59.6 kg)    Physical Exam  Constitutional: She is oriented to person, place, and time and well-developed, well-nourished, and in no distress.  HENT:  Head: Normocephalic and atraumatic.  Mouth/Throat: Oropharynx is clear and moist. No oropharyngeal exudate.  Eyes: Pupils are equal, round, and reactive to light.  Cardiovascular: Normal rate and regular rhythm.  Pulmonary/Chest: Effort normal and breath sounds normal. No respiratory distress. She has no wheezes.  Abdominal: Soft. Bowel sounds are normal. She exhibits no distension and no mass. There is no abdominal tenderness. There is no rebound and no guarding.  Musculoskeletal:        General: No tenderness or edema. Normal range of motion.     Cervical back: Normal range of motion and neck supple.  Neurological: She is alert and oriented to person, place, and time.  Skin: Skin is warm.  Psychiatric: Affect normal.     LABORATORY DATA:  I have reviewed the data as listed Lab Results  Component Value Date   WBC 5.1 03/07/2018   HGB 13.7 03/07/2018   HCT 40.9 03/07/2018   MCV 91.2 03/07/2018   PLT 300.0 03/07/2018   No results for input(s): NA, K, CL, CO2,  GLUCOSE, BUN, CREATININE, CALCIUM, GFRNONAA, GFRAA, PROT, ALBUMIN, AST, ALT, ALKPHOS, BILITOT, BILIDIR, IBILI in the last 8760 hours.  RADIOGRAPHIC STUDIES: I have personally reviewed the radiological images as listed and agreed with the findings in the report. NM SENTINEL NODE INJECTION  Result Date: 05/13/2019 CLINICAL DATA:  Right breast cancer. EXAM: NUCLEAR MEDICINE BREAST LYMPHOSCINTIGRAPHY TECHNIQUE: Intradermal injection of radiopharmaceutical was performed at the 12 o'clock, 3 o'clock, 6 o'clock, and 9 o'clock positions around the right nipple. The patient was then sent to the operating room where the sentinel node(s) were identified and removed by the surgeon. RADIOPHARMACEUTICALS:  Total of 1.1 mCi Millipore-filtered Technetium-38msulfur colloid, injected in four aliquots. IMPRESSION: Uncomplicated intradermal injection of a total of 1.1 MCi Technetium-977mulfur colloid for purposes of sentinel node identification. Electronically Signed   By: AdMarkus Daft.D.   On: 05/13/2019 12:56   [place] LTD UNI RIGHT INC AXILLA  Result Date: 04/23/2019 CLINICAL DATA:  [age]ear old female presenting as a recall from screening for possible right breast distortion. EXAM: DIGITAL DIAGNOSTIC RIGHT MAMMOGRAM WITH TOMO ULTRASOUND RIGHT BREAST COMPARISON:  Previous exam(s). ACR Breast Density Category b: There are scattered areas of fibroglandular density. FINDINGS: Mammogram: Additional spot compression tomosynthesis and full field mL views were performed for the questioned distortion in the right breast. On the additional imaging there is persistence of distortion in the upper central slightly inner right breast. There are no additional new findings identified in the right breast. On physical exam, I do not palpate a fixed discrete mass in the superior aspect of the right breast. Ultrasound: Targeted ultrasound is performed in the right breast at 11 o'clock 4 cm from the nipple demonstrating an area of  distorted tissue measuring approximately 0.8 x 0.7 x 0.7 cm. This likely corresponds to the mammographic finding. Targeted ultrasound of the right axilla demonstrates normal-appearing lymph nodes. IMPRESSION: Architectural distortion in the upper central slightly inner right breast is suspicious. RECOMMENDATION: Ultrasound-guided core needle biopsy of the right breast distortion. I have discussed the findings and recommendations with the patient. If applicable, a reminder letter will be sent to the patient regarding the next appointment. BI-RADS CATEGORY  4: Suspicious. Electronically Signed   By:  Audie Pinto M.D.   On: 04/23/2019 13:43   MM DIAG BREAST TOMO UNI RIGHT  Result Date: 04/23/2019 CLINICAL DATA:  64 year old female presenting as a recall from screening for possible right breast distortion. EXAM: DIGITAL DIAGNOSTIC RIGHT MAMMOGRAM WITH TOMO ULTRASOUND RIGHT BREAST COMPARISON:  Previous exam(s). ACR Breast Density Category b: There are scattered areas of fibroglandular density. FINDINGS: Mammogram: Additional spot compression tomosynthesis and full field mL views were performed for the questioned distortion in the right breast. On the additional imaging there is persistence of distortion in the upper central slightly inner right breast. There are no additional new findings identified in the right breast. On physical exam, I do not palpate a fixed discrete mass in the superior aspect of the right breast. Ultrasound: Targeted ultrasound is performed in the right breast at 11 o'clock 4 cm from the nipple demonstrating an area of distorted tissue measuring approximately 0.8 x 0.7 x 0.7 cm. This likely corresponds to the mammographic finding. Targeted ultrasound of the right axilla demonstrates normal-appearing lymph nodes. IMPRESSION: Architectural distortion in the upper central slightly inner right breast is suspicious. RECOMMENDATION: Ultrasound-guided core needle biopsy of the right breast  distortion. I have discussed the findings and recommendations with the patient. If applicable, a reminder letter will be sent to the patient regarding the next appointment. BI-RADS CATEGORY  4: Suspicious. Electronically Signed   By: Audie Pinto M.D.   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  Status post lumpectomy.   #Recommend Oncotype to decide on systemic chemotherapy.  Clinically less likely patient would need systemic chemotherapy. Agree with radiation; appointment Dr. Donella Stade next week.   #Discussed the use of antihormone therapy like letrozole; #Discussed the mechanism of action of aromatase inhibitors-with blocking of estrogen to prevent breast cancer.  Also discussed the potential side effects including but not limited to arthralgias hot flashes and increased risk of osteoporosis [see below]  # OSTEOPOROSIS [BMD-2021]-T score -3; continue calcium plus vitamin D plus exercise program.  However patient would benefit from either Zometa/Prolia.  This could be started in the next few months.  Thank you Dr. Tollie Pizza.  For allowing me to participate in the care of your pleasant patient. Please do not hesitate to contact me with questions or concerns in the interim.  Discussed with Dr. Tollie Pizza.  Also, Koren Shiver nurse navigator available.   # DISPOSITION: # follow up in 3 weeks; MD; No labs- Dr.B  All questions were answered. The patient/family knows to call the clinic with any problems, questions or concerns.    Cammie Sickle, MD 05/22/2019 12:24 PM

## 2019-05-23 ENCOUNTER — Encounter: Payer: Self-pay | Admitting: Internal Medicine

## 2019-05-28 ENCOUNTER — Other Ambulatory Visit: Payer: Self-pay

## 2019-05-29 ENCOUNTER — Ambulatory Visit
Admission: RE | Admit: 2019-05-29 | Discharge: 2019-05-29 | Disposition: A | Discharge status: Home / Self Care | Payer: PPO | Source: Ambulatory Visit | Attending: Radiation Oncology | Admitting: Radiation Oncology

## 2019-05-29 ENCOUNTER — Encounter: Payer: Self-pay | Admitting: Radiation Oncology

## 2019-05-29 VITALS — BP 132/81 | HR 81 | Temp 97.0°F | Wt 132.0 lb

## 2019-05-29 DIAGNOSIS — Z7982 Long term (current) use of aspirin: Secondary | ICD-10-CM | POA: Insufficient documentation

## 2019-05-29 DIAGNOSIS — E039 Hypothyroidism, unspecified: Secondary | ICD-10-CM | POA: Insufficient documentation

## 2019-05-29 DIAGNOSIS — Z79899 Other long term (current) drug therapy: Secondary | ICD-10-CM | POA: Insufficient documentation

## 2019-05-29 DIAGNOSIS — I10 Essential (primary) hypertension: Secondary | ICD-10-CM | POA: Insufficient documentation

## 2019-05-29 DIAGNOSIS — C50411 Malignant neoplasm of upper-outer quadrant of right female breast: Secondary | ICD-10-CM | POA: Diagnosis not present

## 2019-05-29 DIAGNOSIS — Z170 Estrogen receptor positive status [ER+]: Secondary | ICD-10-CM | POA: Diagnosis not present

## 2019-05-29 NOTE — Consult Note (Signed)
NEW PATIENT EVALUATION  Name: Jennifer Clayton  MRN: [id number]  Date:   05/29/2019     DOB: 10/15/1955   This 64 y.o. female patient presents to the clinic for initial evaluation of stage I right breast cancer (T1 cN0 M0) ER/PR positive HER-2 negative status post wide local excision and sentinel node biopsy with Oncotype DX pending.  REFERRING PHYSICIAN: Burnard Hawthorne, FNP  CHIEF COMPLAINT:  Chief Complaint  Patient presents with  . Breast Cancer    DIAGNOSIS: The encounter diagnosis was Carcinoma of upper-outer quadrant of right breast in female, estrogen receptor positive ([place]).   PREVIOUS INVESTIGATIONS:  Mammogram and ultrasound reviewed Clinical notes reviewed Pathology report reviewed  HPI: Patient is a 64 year old female who presented with an abnormal mammogram of her right breast.  There was architectural distortion in the upper central slightly inner right breast which was confirmed on ultrasound.  Ultrasound of axilla was within normal limits.  She underwent ultrasound-guided biopsy which was positive for invasive mammary carcinoma.  Tumor was ER/PR positive HER-2/neu negative.  She underwent a wide local excision and sentinel node biopsy.  Tumor was 1.5 cm in greatest dimension overall grade 1.  Margins were clear at greater than 2 mm.  Three sentinel lymph nodes were examined all negative for metastatic disease.  This was a T1c lesion.  No lymph vascular invasion was noted.  She is done well postoperatively she is seen medical oncology and Oncotype DX is pending.  She is still somewhat sore from surgery peers to have some tenderness associated with her seroma.  Otherwise patient is doing well.  She is now referred to radiation oncology for opinion.  PLANNED TREATMENT REGIMEN: Right whole breast radiation  PAST MEDICAL HISTORY:  has a past medical history of Cancer ([place]), H/O radioactive iodine thyroid ablation, Headache, Hypertension, and Hypothyroidism.    PAST  SURGICAL HISTORY:  Past Surgical History:  Procedure Laterality Date  . ABDOMINAL HYSTERECTOMY     Age 40, for bleeding and cyst; NO ovaries  . BREAST LUMPECTOMY WITH SENTINEL LYMPH NODE BIOPSY Right 05/13/2019   Procedure: BREAST LUMPECTOMY WITH SENTINEL LYMPH NODE BX;  Surgeon: Robert Bellow, MD;  Location: [facility] ORS;  Service: General;  Laterality: Right;  Marland Kitchen VAGINAL DELIVERY      FAMILY HISTORY: family history includes Cancer in her mother; Dementia in her father.  SOCIAL HISTORY:  reports that she has never smoked. She has never used smokeless tobacco. She reports current alcohol use of about 1.0 standard drinks of alcohol per week. She reports that she does not use drugs.  ALLERGIES: Zomepirac, Amoxicillin-pot clavulanate, and Other  MEDICATIONS:  Current Outpatient Medications  Medication Sig Dispense Refill  . amLODipine (NORVASC) 5 MG tablet Take 1 tablet (5 mg total) by mouth daily. (Patient taking differently: Take 5 mg by mouth at bedtime. ) 90 tablet 3  . Ascorbic Acid (VITAMIN C PO) Take 1 tablet by mouth daily.    Marland Kitchen aspirin EC 81 MG tablet Take 81 mg by mouth every other day.    Marland Kitchen buPROPion (WELLBUTRIN XL) 300 MG 24 hr tablet Take 1 tablet (300 mg total) by mouth daily. (Patient taking differently: Take 300 mg by mouth every morning. ) 90 tablet 1  . HYDROcodone-acetaminophen (NORCO/VICODIN) 5-325 MG tablet Take 1 tablet by mouth every 4 (four) hours as needed for moderate pain. 10 tablet 0  . levothyroxine (SYNTHROID) 100 MCG tablet TAKE 1 TABLET EVERY DAY ON EMPTY STOMACHWITH A GLASS OF WATER  AT LEAST 30-60 MINBEFORE BREAKFAST (Patient taking differently: Take 100 mcg by mouth daily before breakfast. ) 90 tablet 0  . Multiple Vitamins-Minerals (MULTIVITAMIN ADULT PO) Take 1 tablet by mouth daily.    . SUMAtriptan (IMITREX) 50 MG tablet TAKE 1 TABLET BY MOUTH ONCE-MAY REPEAT ONCE IN 2 HOURS IF NECESSARY (Patient taking differently: Take 12.5 mg by mouth every 2 (two) hours  as needed for migraine or headache. ) 15 tablet 2  . zolpidem (AMBIEN) 5 MG tablet TAKE 1 TABLET BY MOUTH AT BEDTIME (Patient taking differently: Take 2.5 mg by mouth at bedtime. ) 30 tablet 1   No current facility-administered medications for this encounter.    ECOG PERFORMANCE STATUS:  0 - Asymptomatic  REVIEW OF SYSTEMS: Patient denies any weight loss, fatigue, weakness, fever, chills or night sweats. Patient denies any loss of vision, blurred vision. Patient denies any ringing  of the ears or hearing loss. No irregular heartbeat. Patient denies heart murmur or history of fainting. Patient denies any chest pain or pain radiating to her upper extremities. Patient denies any shortness of breath, difficulty breathing at night, cough or hemoptysis. Patient denies any swelling in the lower legs. Patient denies any nausea vomiting, vomiting of blood, or coffee ground material in the vomitus. Patient denies any stomach pain. Patient states has had normal bowel movements no significant constipation or diarrhea. Patient denies any dysuria, hematuria or significant nocturia. Patient denies any problems walking, swelling in the joints or loss of balance. Patient denies any skin changes, loss of hair or loss of weight. Patient denies any excessive worrying or anxiety or significant depression. Patient denies any problems with insomnia. Patient denies excessive thirst, polyuria, polydipsia. Patient denies any swollen glands, patient denies easy bruising or easy bleeding. Patient denies any recent infections, allergies or URI. Patient "s visual fields have not changed significantly in recent time.   PHYSICAL EXAM: BP 132/81 (BP Location: Left Arm, Patient Position: Sitting, Cuff Size: Normal)   Pulse 81   Temp (!) 97 F (36.1 C) (Tympanic)   Wt 132 lb (59.9 kg)   BMI 23.38 kg/m  There is some firmness over her lumpectomy site secondary to seroma no other dominant mass or nodularity is noted in either breast  in two positions examined no axillary or supraclavicular adenopathy is identified.  Well-developed well-nourished patient in NAD. HEENT reveals PERLA, EOMI, discs not visualized.  Oral cavity is clear. No oral mucosal lesions are identified. Neck is clear without evidence of cervical or supraclavicular adenopathy. Lungs are clear to A&P. Cardiac examination is essentially unremarkable with regular rate and rhythm without murmur rub or thrill. Abdomen is benign with no organomegaly or masses noted. Motor sensory and DTR levels are equal and symmetric in the upper and lower extremities. Cranial nerves II through XII are grossly intact. Proprioception is intact. No peripheral adenopathy or edema is identified. No motor or sensory levels are noted. Crude visual fields are within normal range.  LABORATORY DATA: Pathology report reviewed    RADIOLOGY RESULTS: Mammogram and ultrasound reviewed   IMPRESSION: Stage I ER/PR positive HER-2 negative invasive mammary carcinoma the right breast status post wide local excision and sentinel node biopsy in 64 year old female with Oncotype DX pending  PLAN: At this time of recommended whole breast radiation.  Breast is rather large I believe hypofractionated course of treatment would be difficult.  I would plan on delivering 5040 cGy in 28 fractions.  Also boost her scar another 1400 cGy using electron beam.  Risks and benefits of treatment including skin reaction fatigue alteration of blood counts possible inclusion of superficial lung all were described in detail to the patient.  She seems to comprehend my treatment plan well.  I have personally set up and ordered CT simulation in about a week and a half's time to allow some further healing and return of the Oncotype DX.  Patient will be a candidate for antiestrogen therapy after completion of radiation.  I would like to take this opportunity to thank you for allowing me to participate in the care of your  patient.Noreene Filbert, MD

## 2019-05-31 DIAGNOSIS — Z170 Estrogen receptor positive status [ER+]: Secondary | ICD-10-CM | POA: Diagnosis not present

## 2019-05-31 DIAGNOSIS — C50411 Malignant neoplasm of upper-outer quadrant of right female breast: Secondary | ICD-10-CM | POA: Diagnosis not present

## 2019-06-03 ENCOUNTER — Encounter: Payer: Self-pay | Admitting: Internal Medicine

## 2019-06-04 ENCOUNTER — Telehealth: Payer: Self-pay | Admitting: Internal Medicine

## 2019-06-04 NOTE — Telephone Encounter (Signed)
On 5/04-spoke to patient regarding results of the Oncotype; no significant benefit from chemotherapy.  Hence would not recommend chemotherapy.  Patient will proceed with radiation as planned.  Patient will keep appointment with me as planned

## 2019-06-05 ENCOUNTER — Ambulatory Visit: Payer: PPO

## 2019-06-10 ENCOUNTER — Other Ambulatory Visit: Payer: Self-pay

## 2019-06-11 ENCOUNTER — Ambulatory Visit
Admission: RE | Admit: 2019-06-11 | Discharge: 2019-06-11 | Disposition: A | Discharge status: Home / Self Care | Payer: PPO | Source: Ambulatory Visit | Attending: Radiation Oncology | Admitting: Radiation Oncology

## 2019-06-11 ENCOUNTER — Other Ambulatory Visit: Payer: Self-pay

## 2019-06-11 DIAGNOSIS — Z170 Estrogen receptor positive status [ER+]: Secondary | ICD-10-CM | POA: Diagnosis not present

## 2019-06-11 DIAGNOSIS — C50411 Malignant neoplasm of upper-outer quadrant of right female breast: Secondary | ICD-10-CM | POA: Insufficient documentation

## 2019-06-11 DIAGNOSIS — Z510 Encounter for antineoplastic radiation therapy: Secondary | ICD-10-CM | POA: Insufficient documentation

## 2019-06-12 ENCOUNTER — Encounter: Payer: Self-pay | Admitting: Internal Medicine

## 2019-06-12 ENCOUNTER — Inpatient Hospital Stay: Payer: PPO | Attending: Internal Medicine | Admitting: Internal Medicine

## 2019-06-12 DIAGNOSIS — Z79899 Other long term (current) drug therapy: Secondary | ICD-10-CM | POA: Diagnosis not present

## 2019-06-12 DIAGNOSIS — Z7982 Long term (current) use of aspirin: Secondary | ICD-10-CM | POA: Insufficient documentation

## 2019-06-12 DIAGNOSIS — E039 Hypothyroidism, unspecified: Secondary | ICD-10-CM | POA: Diagnosis not present

## 2019-06-12 DIAGNOSIS — M810 Age-related osteoporosis without current pathological fracture: Secondary | ICD-10-CM | POA: Insufficient documentation

## 2019-06-12 DIAGNOSIS — Z85828 Personal history of other malignant neoplasm of skin: Secondary | ICD-10-CM | POA: Insufficient documentation

## 2019-06-12 DIAGNOSIS — I10 Essential (primary) hypertension: Secondary | ICD-10-CM | POA: Insufficient documentation

## 2019-06-12 DIAGNOSIS — Z170 Estrogen receptor positive status [ER+]: Secondary | ICD-10-CM | POA: Diagnosis not present

## 2019-06-12 DIAGNOSIS — C50411 Malignant neoplasm of upper-outer quadrant of right female breast: Secondary | ICD-10-CM | POA: Insufficient documentation

## 2019-06-12 NOTE — Assessment & Plan Note (Addendum)
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  Status post lumpectomy; on RT- Oncotyoe- 5% drisk of recurrence. No chemo  #Discussed the use of antihormone therapy like letrozole; #Discussed the mechanism of action of aromatase inhibitors-with blocking of estrogen to prevent breast cancer.  Also discussed the potential side effects including but not limited to arthralgias hot flashes and increased risk of osteoporosis [see below]  # OSTEOPOROSIS [BMD-2021]-T score -3; continue calcium plus vitamin D plus exercise program.  However patient would benefit from either Zometa/Prolia.  This could be started in the next few months.  #Discussed participation in the clinical trial-upbeat study; patient was to think about it.  # DISPOSITION: # Follow up in 2 months- MD; no labs- Dr.B

## 2019-06-12 NOTE — Progress Notes (Signed)
[facility] [place] NOTE  Patient Care Team: Burnard Hawthorne, FNP as PCP - General (Family Medicine) Theodore Demark, RN as Oncology Nurse Navigator Noreene Filbert, MD as Radiation Oncologist (Radiation Oncology)  CHIEF COMPLAINTS/PURPOSE OF CONSULTATION: Breast cancer  #  Oncology History Overview Note  # MARCH 2021-right breast 11 o'clock position-s/p lumpectomy; SLNBx [Dr. Tollie Pizza ]pT1c (15 mm) p N0-grade-1; ER/PR positive HER-2 negative; Oncotype low risk [risk of recurrence is 5%]-no chemotherapy recommend radiation; antihormone  # SURVIVORSHIP:   # GENETICS:   DIAGNOSIS:   STAGE:         ;  GOALS:  CURRENT/MOST RECENT THERAPY :     Carcinoma of upper-outer quadrant of right breast in female, estrogen receptor positive ([place])  05/22/2019 Initial Diagnosis   Carcinoma of upper-outer quadrant of right breast in female, estrogen receptor positive ([place])      HISTORY OF PRESENTING ILLNESS:  Jennifer Clayton 64 y.o.  female stage I breast cancer ER/PR positive or negative is here for follow-up.  Patient denies any new lumps or bumps.  No fever no chills.  Appetite is good.  No weight loss.   Review of Systems  Constitutional: Negative for chills, diaphoresis, fever, malaise/fatigue and weight loss.  HENT: Negative for nosebleeds and sore throat.   Eyes: Negative for double vision.  Respiratory: Negative for cough, hemoptysis, sputum production, shortness of breath and wheezing.   Cardiovascular: Negative for chest pain, palpitations, orthopnea and leg swelling.  Gastrointestinal: Negative for abdominal pain, blood in stool, constipation, diarrhea, heartburn, melena, nausea and vomiting.  Genitourinary: Negative for dysuria, frequency and urgency.  Musculoskeletal: Negative for back pain and joint pain.  Skin: Negative.  Negative for itching and rash.  Neurological: Negative for dizziness, tingling, focal weakness, weakness and headaches.   Endo/Heme/Allergies: Does not bruise/bleed easily.  Psychiatric/Behavioral: Negative for depression. The patient is not nervous/anxious and does not have insomnia.      MEDICAL HISTORY:  Past Medical History:  Diagnosis Date  . Cancer ([place])    skin cancer (left leg)/BREAST CANCER  . H/O radioactive iodine thyroid ablation   . Headache    MIGRAINES  . Hypertension   . Hypothyroidism     SURGICAL HISTORY: Past Surgical History:  Procedure Laterality Date  . ABDOMINAL HYSTERECTOMY     Age 46, for bleeding and cyst; NO ovaries  . BREAST LUMPECTOMY WITH SENTINEL LYMPH NODE BIOPSY Right 05/13/2019   Procedure: BREAST LUMPECTOMY WITH SENTINEL LYMPH NODE BX;  Surgeon: Robert Bellow, MD;  Location: [facility] ORS;  Service: General;  Laterality: Right;  Marland Kitchen VAGINAL DELIVERY      SOCIAL HISTORY: Social History   Socioeconomic History  . Marital status: Married    Spouse name: Not on file  . Number of children: Not on file  . Years of education: Not on file  . Highest education level: Not on file  Occupational History  . Not on file  Tobacco Use  . Smoking status: Never Smoker  . Smokeless tobacco: Never Used  Substance and Sexual Activity  . Alcohol use: Yes    Alcohol/week: 1.0 standard drinks    Types: 1 Glasses of wine per week    Comment: rare  . Drug use: Never  . Sexual activity: Not on file  Other Topics Concern  . Not on file  Social History Narrative   Lives in [place] with husband and son and 4 grandchildren.      Work - Full time in [employer]      Diet - regular diet      Exercise - typically daily at [facility] for 45-32mn, lifting some weight, ellipitical, walks.       Never smoked; social alcohol.       Social Determinants of Health   Financial Resource Strain:   . Difficulty of Paying Living Expenses:   Food Insecurity:   . Worried About RCharity fundraiserin the Last Year:   . RArboriculturistin the Last Year:   Transportation Needs:   . LConsulting civil engineer(Medical):   .Marland KitchenLack of Transportation (Non-Medical):   Physical Activity:   . Days of Exercise per Week:   . Minutes of Exercise per Session:   Stress:   . Feeling of Stress :   Social Connections:   . Frequency of Communication with Friends and Family:   . Frequency of Social Gatherings with Friends and Family:   . Attends Religious Services:   . Active Member of Clubs or Organizations:   . Attends CArchivistMeetings:   .Marland KitchenMarital Status:   Intimate Partner Violence:   . Fear of Current or Ex-Partner:   . Emotionally Abused:   .Marland KitchenPhysically Abused:   . Sexually Abused:     FAMILY HISTORY: Family History  Problem Relation Age of Onset  . Cancer Mother        lung, non-smoker  . Dementia Father        related to traumatic brain injury  . Breast cancer Neg Hx     ALLERGIES:  is allergic to zomepirac; amoxicillin-pot clavulanate; and other.  MEDICATIONS:  Current Outpatient Medications  Medication Sig Dispense Refill  . amLODipine (NORVASC) 5 MG tablet Take 1 tablet (5 mg total) by mouth daily. (Patient taking differently: Take 5 mg by mouth at bedtime. ) 90 tablet 3  . Ascorbic Acid (VITAMIN C PO) Take 1 tablet by mouth daily.    .Marland Kitchenaspirin EC 81 MG tablet Take 81 mg by mouth every other day.    .Marland KitchenbuPROPion (WELLBUTRIN XL) 300 MG 24 hr tablet Take 1 tablet (300 mg total) by mouth daily. (Patient taking differently: Take 300 mg by mouth every morning. ) 90 tablet 1  . levothyroxine (SYNTHROID) 100 MCG tablet TAKE 1 TABLET EVERY DAY ON EMPTY STOMACHWITH A GLASS OF WATER AT LEAST 30-60 MINBEFORE BREAKFAST (Patient taking differently: Take 100 mcg by mouth daily before breakfast. ) 90 tablet 0  . Multiple Vitamins-Minerals (MULTIVITAMIN ADULT PO) Take 1 tablet by mouth daily.    . SUMAtriptan (IMITREX) 50 MG tablet TAKE 1 TABLET BY MOUTH ONCE-MAY REPEAT ONCE IN 2 HOURS IF NECESSARY (Patient taking differently: Take 12.5 mg by mouth every 2 (two) hours as  needed for migraine or headache. ) 15 tablet 2  . zolpidem (AMBIEN) 5 MG tablet TAKE 1 TABLET BY MOUTH AT BEDTIME (Patient taking differently: Take 2.5 mg by mouth at bedtime. ) 30 tablet 1  . HYDROcodone-acetaminophen (NORCO/VICODIN) 5-325 MG tablet Take 1 tablet by mouth every 4 (four) hours as needed for moderate pain. (Patient not taking: Reported on 06/11/2019) 10 tablet 0   No current facility-administered medications for this visit.      .Marland Kitchen PHYSICAL EXAMINATION: ECOG PERFORMANCE STATUS: 0 - Asymptomatic  Vitals:   06/12/19 1007  BP: 127/75  Pulse: 80  Temp: (!) 97 F (36.1 C)   Filed Weights   06/12/19 1007  Weight: 133 lb 4 oz (  60.4 kg)    Physical Exam  Constitutional: She is oriented to person, place, and time and well-developed, well-nourished, and in no distress.  HENT:  Head: Normocephalic and atraumatic.  Mouth/Throat: Oropharynx is clear and moist. No oropharyngeal exudate.  Eyes: Pupils are equal, round, and reactive to light.  Cardiovascular: Normal rate and regular rhythm.  Pulmonary/Chest: Effort normal and breath sounds normal. No respiratory distress. She has no wheezes.  Abdominal: Soft. Bowel sounds are normal. She exhibits no distension and no mass. There is no abdominal tenderness. There is no rebound and no guarding.  Musculoskeletal:        General: No tenderness or edema. Normal range of motion.     Cervical back: Normal range of motion and neck supple.  Neurological: She is alert and oriented to person, place, and time.  Skin: Skin is warm.  Psychiatric: Affect normal.     LABORATORY DATA:  I have reviewed the data as listed Lab Results  Component Value Date   WBC 5.1 03/07/2018   HGB 13.7 03/07/2018   HCT 40.9 03/07/2018   MCV 91.2 03/07/2018   PLT 300.0 03/07/2018   No results for input(s): NA, K, CL, CO2, GLUCOSE, BUN, CREATININE, CALCIUM, GFRNONAA, GFRAA, PROT, ALBUMIN, AST, ALT, ALKPHOS, BILITOT, BILIDIR, IBILI in the last 8760  hours.  RADIOGRAPHIC STUDIES: I have personally reviewed the radiological images as listed and agreed with the findings in the report. No results found.  ASSESSMENT & PLAN:   Carcinoma of upper-outer quadrant of right breast in female, estrogen receptor positive ([place]) #Invasive mammary carcinoma right breast-pT1cpN0; grade 1 ER positive PR negative HER-2 negative.  Status post lumpectomy; on RT- Oncotyoe- 5% drisk of recurrence. No chemo  #Discussed the use of antihormone therapy like letrozole; #Discussed the mechanism of action of aromatase inhibitors-with blocking of estrogen to prevent breast cancer.  Also discussed the potential side effects including but not limited to arthralgias hot flashes and increased risk of osteoporosis [see below]  # OSTEOPOROSIS [BMD-2021]-T score -3; continue calcium plus vitamin D plus exercise program.  However patient would benefit from either Zometa/Prolia.  This could be started in the next few months.  #Discussed participation in the clinical trial-upbeat study; patient was to think about it.  # DISPOSITION: # Follow up in 2 months- MD; no labs- Dr.B  All questions were answered. The patient/family knows to call the clinic with any problems, questions or concerns.    Jennifer Sickle, MD 06/16/2019 5:16 PM

## 2019-06-14 ENCOUNTER — Other Ambulatory Visit: Payer: Self-pay | Admitting: *Deleted

## 2019-06-14 DIAGNOSIS — C50411 Malignant neoplasm of upper-outer quadrant of right female breast: Secondary | ICD-10-CM

## 2019-06-14 DIAGNOSIS — Z170 Estrogen receptor positive status [ER+]: Secondary | ICD-10-CM

## 2019-06-17 DIAGNOSIS — C50411 Malignant neoplasm of upper-outer quadrant of right female breast: Secondary | ICD-10-CM | POA: Diagnosis not present

## 2019-06-17 DIAGNOSIS — Z510 Encounter for antineoplastic radiation therapy: Secondary | ICD-10-CM | POA: Diagnosis not present

## 2019-06-19 ENCOUNTER — Ambulatory Visit: Admission: RE | Admit: 2019-06-19 | Payer: PPO | Source: Ambulatory Visit

## 2019-06-19 DIAGNOSIS — Z510 Encounter for antineoplastic radiation therapy: Secondary | ICD-10-CM | POA: Diagnosis not present

## 2019-06-19 DIAGNOSIS — C50411 Malignant neoplasm of upper-outer quadrant of right female breast: Secondary | ICD-10-CM | POA: Diagnosis not present

## 2019-06-20 ENCOUNTER — Ambulatory Visit
Admission: RE | Admit: 2019-06-20 | Discharge: 2019-06-20 | Disposition: A | Discharge status: Home / Self Care | Payer: PPO | Source: Ambulatory Visit | Attending: Radiation Oncology | Admitting: Radiation Oncology

## 2019-06-20 DIAGNOSIS — Z510 Encounter for antineoplastic radiation therapy: Secondary | ICD-10-CM | POA: Diagnosis not present

## 2019-06-20 DIAGNOSIS — C50411 Malignant neoplasm of upper-outer quadrant of right female breast: Secondary | ICD-10-CM | POA: Diagnosis not present

## 2019-06-21 ENCOUNTER — Ambulatory Visit
Admission: RE | Admit: 2019-06-21 | Discharge: 2019-06-21 | Disposition: A | Discharge status: Home / Self Care | Payer: PPO | Source: Ambulatory Visit | Attending: Radiation Oncology | Admitting: Radiation Oncology

## 2019-06-21 DIAGNOSIS — C50411 Malignant neoplasm of upper-outer quadrant of right female breast: Secondary | ICD-10-CM | POA: Diagnosis not present

## 2019-06-21 DIAGNOSIS — Z510 Encounter for antineoplastic radiation therapy: Secondary | ICD-10-CM | POA: Diagnosis not present

## 2019-06-24 ENCOUNTER — Ambulatory Visit
Admission: RE | Admit: 2019-06-24 | Discharge: 2019-06-24 | Disposition: A | Discharge status: Home / Self Care | Payer: PPO | Source: Ambulatory Visit | Attending: Radiation Oncology | Admitting: Radiation Oncology

## 2019-06-24 DIAGNOSIS — Z510 Encounter for antineoplastic radiation therapy: Secondary | ICD-10-CM | POA: Diagnosis not present

## 2019-06-24 DIAGNOSIS — C50411 Malignant neoplasm of upper-outer quadrant of right female breast: Secondary | ICD-10-CM | POA: Diagnosis not present

## 2019-06-25 ENCOUNTER — Ambulatory Visit
Admission: RE | Admit: 2019-06-25 | Discharge: 2019-06-25 | Disposition: A | Discharge status: Home / Self Care | Payer: PPO | Source: Ambulatory Visit | Attending: Radiation Oncology | Admitting: Radiation Oncology

## 2019-06-25 DIAGNOSIS — Z510 Encounter for antineoplastic radiation therapy: Secondary | ICD-10-CM | POA: Diagnosis not present

## 2019-06-25 DIAGNOSIS — C50411 Malignant neoplasm of upper-outer quadrant of right female breast: Secondary | ICD-10-CM | POA: Diagnosis not present

## 2019-06-26 ENCOUNTER — Ambulatory Visit
Admission: RE | Admit: 2019-06-26 | Discharge: 2019-06-26 | Disposition: A | Discharge status: Home / Self Care | Payer: PPO | Source: Ambulatory Visit | Attending: Radiation Oncology | Admitting: Radiation Oncology

## 2019-06-26 DIAGNOSIS — Z510 Encounter for antineoplastic radiation therapy: Secondary | ICD-10-CM | POA: Diagnosis not present

## 2019-06-26 DIAGNOSIS — C50411 Malignant neoplasm of upper-outer quadrant of right female breast: Secondary | ICD-10-CM | POA: Diagnosis not present

## 2019-06-27 ENCOUNTER — Ambulatory Visit
Admission: RE | Admit: 2019-06-27 | Discharge: 2019-06-27 | Disposition: A | Discharge status: Home / Self Care | Payer: PPO | Source: Ambulatory Visit | Attending: Radiation Oncology | Admitting: Radiation Oncology

## 2019-06-27 DIAGNOSIS — C50411 Malignant neoplasm of upper-outer quadrant of right female breast: Secondary | ICD-10-CM | POA: Diagnosis not present

## 2019-06-27 DIAGNOSIS — Z510 Encounter for antineoplastic radiation therapy: Secondary | ICD-10-CM | POA: Diagnosis not present

## 2019-06-28 ENCOUNTER — Ambulatory Visit
Admission: RE | Admit: 2019-06-28 | Discharge: 2019-06-28 | Disposition: A | Discharge status: Home / Self Care | Payer: PPO | Source: Ambulatory Visit | Attending: Radiation Oncology | Admitting: Radiation Oncology

## 2019-06-28 DIAGNOSIS — C50411 Malignant neoplasm of upper-outer quadrant of right female breast: Secondary | ICD-10-CM | POA: Diagnosis not present

## 2019-06-28 DIAGNOSIS — Z510 Encounter for antineoplastic radiation therapy: Secondary | ICD-10-CM | POA: Diagnosis not present

## 2019-07-02 ENCOUNTER — Ambulatory Visit
Admission: RE | Admit: 2019-07-02 | Discharge: 2019-07-02 | Disposition: A | Discharge status: Home / Self Care | Payer: PPO | Source: Ambulatory Visit | Attending: Radiation Oncology | Admitting: Radiation Oncology

## 2019-07-02 DIAGNOSIS — C50411 Malignant neoplasm of upper-outer quadrant of right female breast: Secondary | ICD-10-CM | POA: Diagnosis not present

## 2019-07-02 DIAGNOSIS — Z510 Encounter for antineoplastic radiation therapy: Secondary | ICD-10-CM | POA: Diagnosis not present

## 2019-07-02 DIAGNOSIS — Z170 Estrogen receptor positive status [ER+]: Secondary | ICD-10-CM | POA: Insufficient documentation

## 2019-07-03 ENCOUNTER — Other Ambulatory Visit: Payer: Self-pay

## 2019-07-03 ENCOUNTER — Ambulatory Visit
Admission: RE | Admit: 2019-07-03 | Discharge: 2019-07-03 | Disposition: A | Discharge status: Home / Self Care | Payer: PPO | Source: Ambulatory Visit | Attending: Radiation Oncology | Admitting: Radiation Oncology

## 2019-07-03 ENCOUNTER — Inpatient Hospital Stay: Payer: PPO | Attending: Internal Medicine

## 2019-07-03 DIAGNOSIS — C50411 Malignant neoplasm of upper-outer quadrant of right female breast: Secondary | ICD-10-CM | POA: Insufficient documentation

## 2019-07-03 DIAGNOSIS — Z510 Encounter for antineoplastic radiation therapy: Secondary | ICD-10-CM | POA: Diagnosis not present

## 2019-07-03 DIAGNOSIS — Z170 Estrogen receptor positive status [ER+]: Secondary | ICD-10-CM | POA: Diagnosis not present

## 2019-07-03 LAB — CBC
HCT: 40 % (ref 36.0–46.0)
Hemoglobin: 13.2 g/dL (ref 12.0–15.0)
MCH: 30.9 pg (ref 26.0–34.0)
MCHC: 33 g/dL (ref 30.0–36.0)
MCV: 93.7 fL (ref 80.0–100.0)
Platelets: 240 10*3/uL (ref 150–400)
RBC: 4.27 MIL/uL (ref 3.87–5.11)
RDW: 12.1 % (ref 11.5–15.5)
WBC: 4.3 10*3/uL (ref 4.0–10.5)
nRBC: 0 % (ref 0.0–0.2)

## 2019-07-04 ENCOUNTER — Ambulatory Visit
Admission: RE | Admit: 2019-07-04 | Discharge: 2019-07-04 | Disposition: A | Discharge status: Home / Self Care | Payer: PPO | Source: Ambulatory Visit | Attending: Radiation Oncology | Admitting: Radiation Oncology

## 2019-07-04 DIAGNOSIS — C50411 Malignant neoplasm of upper-outer quadrant of right female breast: Secondary | ICD-10-CM | POA: Diagnosis not present

## 2019-07-04 DIAGNOSIS — Z510 Encounter for antineoplastic radiation therapy: Secondary | ICD-10-CM | POA: Diagnosis not present

## 2019-07-05 ENCOUNTER — Ambulatory Visit
Admission: RE | Admit: 2019-07-05 | Discharge: 2019-07-05 | Disposition: A | Discharge status: Home / Self Care | Payer: PPO | Source: Ambulatory Visit | Attending: Radiation Oncology | Admitting: Radiation Oncology

## 2019-07-05 ENCOUNTER — Other Ambulatory Visit: Payer: Self-pay | Admitting: Family

## 2019-07-05 DIAGNOSIS — Z510 Encounter for antineoplastic radiation therapy: Secondary | ICD-10-CM | POA: Diagnosis not present

## 2019-07-05 DIAGNOSIS — C50411 Malignant neoplasm of upper-outer quadrant of right female breast: Secondary | ICD-10-CM | POA: Diagnosis not present

## 2019-07-05 DIAGNOSIS — G4700 Insomnia, unspecified: Secondary | ICD-10-CM

## 2019-07-05 NOTE — Telephone Encounter (Signed)
Refill request for Jennifer Clayton, last seen 05-07-19, last filled 04-01-19.  Please advise.

## 2019-07-08 ENCOUNTER — Ambulatory Visit
Admission: RE | Admit: 2019-07-08 | Discharge: 2019-07-08 | Disposition: A | Discharge status: Home / Self Care | Payer: PPO | Source: Ambulatory Visit | Attending: Radiation Oncology | Admitting: Radiation Oncology

## 2019-07-08 DIAGNOSIS — Z510 Encounter for antineoplastic radiation therapy: Secondary | ICD-10-CM | POA: Diagnosis not present

## 2019-07-08 DIAGNOSIS — C50411 Malignant neoplasm of upper-outer quadrant of right female breast: Secondary | ICD-10-CM | POA: Diagnosis not present

## 2019-07-09 ENCOUNTER — Ambulatory Visit
Admission: RE | Admit: 2019-07-09 | Discharge: 2019-07-09 | Disposition: A | Discharge status: Home / Self Care | Payer: PPO | Source: Ambulatory Visit | Attending: Radiation Oncology | Admitting: Radiation Oncology

## 2019-07-09 DIAGNOSIS — Z510 Encounter for antineoplastic radiation therapy: Secondary | ICD-10-CM | POA: Diagnosis not present

## 2019-07-09 DIAGNOSIS — C50411 Malignant neoplasm of upper-outer quadrant of right female breast: Secondary | ICD-10-CM | POA: Diagnosis not present

## 2019-07-10 ENCOUNTER — Ambulatory Visit
Admission: RE | Admit: 2019-07-10 | Discharge: 2019-07-10 | Disposition: A | Discharge status: Home / Self Care | Payer: PPO | Source: Ambulatory Visit | Attending: Radiation Oncology | Admitting: Radiation Oncology

## 2019-07-10 DIAGNOSIS — C50411 Malignant neoplasm of upper-outer quadrant of right female breast: Secondary | ICD-10-CM | POA: Diagnosis not present

## 2019-07-10 DIAGNOSIS — Z510 Encounter for antineoplastic radiation therapy: Secondary | ICD-10-CM | POA: Diagnosis not present

## 2019-07-11 ENCOUNTER — Ambulatory Visit
Admission: RE | Admit: 2019-07-11 | Discharge: 2019-07-11 | Disposition: A | Discharge status: Home / Self Care | Payer: PPO | Source: Ambulatory Visit | Attending: Radiation Oncology | Admitting: Radiation Oncology

## 2019-07-11 DIAGNOSIS — C50411 Malignant neoplasm of upper-outer quadrant of right female breast: Secondary | ICD-10-CM | POA: Diagnosis not present

## 2019-07-11 DIAGNOSIS — Z510 Encounter for antineoplastic radiation therapy: Secondary | ICD-10-CM | POA: Diagnosis not present

## 2019-07-12 ENCOUNTER — Ambulatory Visit
Admission: RE | Admit: 2019-07-12 | Discharge: 2019-07-12 | Disposition: A | Discharge status: Home / Self Care | Payer: PPO | Source: Ambulatory Visit | Attending: Radiation Oncology | Admitting: Radiation Oncology

## 2019-07-12 DIAGNOSIS — Z510 Encounter for antineoplastic radiation therapy: Secondary | ICD-10-CM | POA: Diagnosis not present

## 2019-07-12 DIAGNOSIS — C50411 Malignant neoplasm of upper-outer quadrant of right female breast: Secondary | ICD-10-CM | POA: Diagnosis not present

## 2019-07-15 ENCOUNTER — Ambulatory Visit
Admission: RE | Admit: 2019-07-15 | Discharge: 2019-07-15 | Disposition: A | Discharge status: Home / Self Care | Payer: PPO | Source: Ambulatory Visit | Attending: Radiation Oncology | Admitting: Radiation Oncology

## 2019-07-15 DIAGNOSIS — Z510 Encounter for antineoplastic radiation therapy: Secondary | ICD-10-CM | POA: Diagnosis not present

## 2019-07-15 DIAGNOSIS — C50411 Malignant neoplasm of upper-outer quadrant of right female breast: Secondary | ICD-10-CM | POA: Diagnosis not present

## 2019-07-16 ENCOUNTER — Ambulatory Visit
Admission: RE | Admit: 2019-07-16 | Discharge: 2019-07-16 | Disposition: A | Discharge status: Home / Self Care | Payer: PPO | Source: Ambulatory Visit | Attending: Radiation Oncology | Admitting: Radiation Oncology

## 2019-07-16 DIAGNOSIS — C50411 Malignant neoplasm of upper-outer quadrant of right female breast: Secondary | ICD-10-CM | POA: Diagnosis not present

## 2019-07-16 DIAGNOSIS — Z510 Encounter for antineoplastic radiation therapy: Secondary | ICD-10-CM | POA: Diagnosis not present

## 2019-07-17 ENCOUNTER — Inpatient Hospital Stay: Payer: PPO

## 2019-07-17 ENCOUNTER — Ambulatory Visit
Admission: RE | Admit: 2019-07-17 | Discharge: 2019-07-17 | Disposition: A | Discharge status: Home / Self Care | Payer: PPO | Source: Ambulatory Visit | Attending: Radiation Oncology | Admitting: Radiation Oncology

## 2019-07-17 ENCOUNTER — Other Ambulatory Visit: Payer: Self-pay

## 2019-07-17 DIAGNOSIS — C50411 Malignant neoplasm of upper-outer quadrant of right female breast: Secondary | ICD-10-CM

## 2019-07-17 DIAGNOSIS — Z510 Encounter for antineoplastic radiation therapy: Secondary | ICD-10-CM | POA: Diagnosis not present

## 2019-07-17 DIAGNOSIS — Z170 Estrogen receptor positive status [ER+]: Secondary | ICD-10-CM

## 2019-07-17 LAB — CBC
HCT: 41.4 % (ref 36.0–46.0)
Hemoglobin: 13.6 g/dL (ref 12.0–15.0)
MCH: 30.3 pg (ref 26.0–34.0)
MCHC: 32.9 g/dL (ref 30.0–36.0)
MCV: 92.2 fL (ref 80.0–100.0)
Platelets: 256 10*3/uL (ref 150–400)
RBC: 4.49 MIL/uL (ref 3.87–5.11)
RDW: 11.9 % (ref 11.5–15.5)
WBC: 4.8 10*3/uL (ref 4.0–10.5)
nRBC: 0 % (ref 0.0–0.2)

## 2019-07-18 ENCOUNTER — Ambulatory Visit
Admission: RE | Admit: 2019-07-18 | Discharge: 2019-07-18 | Disposition: A | Discharge status: Home / Self Care | Payer: PPO | Source: Ambulatory Visit | Attending: Radiation Oncology | Admitting: Radiation Oncology

## 2019-07-18 DIAGNOSIS — C50411 Malignant neoplasm of upper-outer quadrant of right female breast: Secondary | ICD-10-CM | POA: Diagnosis not present

## 2019-07-18 DIAGNOSIS — Z510 Encounter for antineoplastic radiation therapy: Secondary | ICD-10-CM | POA: Diagnosis not present

## 2019-07-19 ENCOUNTER — Ambulatory Visit
Admission: RE | Admit: 2019-07-19 | Discharge: 2019-07-19 | Disposition: A | Discharge status: Home / Self Care | Payer: PPO | Source: Ambulatory Visit | Attending: Radiation Oncology | Admitting: Radiation Oncology

## 2019-07-19 DIAGNOSIS — Z510 Encounter for antineoplastic radiation therapy: Secondary | ICD-10-CM | POA: Diagnosis not present

## 2019-07-19 DIAGNOSIS — C50411 Malignant neoplasm of upper-outer quadrant of right female breast: Secondary | ICD-10-CM | POA: Diagnosis not present

## 2019-07-22 ENCOUNTER — Ambulatory Visit
Admission: RE | Admit: 2019-07-22 | Discharge: 2019-07-22 | Disposition: A | Discharge status: Home / Self Care | Payer: PPO | Source: Ambulatory Visit | Attending: Radiation Oncology | Admitting: Radiation Oncology

## 2019-07-22 DIAGNOSIS — C50411 Malignant neoplasm of upper-outer quadrant of right female breast: Secondary | ICD-10-CM | POA: Diagnosis not present

## 2019-07-22 DIAGNOSIS — Z510 Encounter for antineoplastic radiation therapy: Secondary | ICD-10-CM | POA: Diagnosis not present

## 2019-07-23 ENCOUNTER — Ambulatory Visit
Admission: RE | Admit: 2019-07-23 | Discharge: 2019-07-23 | Disposition: A | Discharge status: Home / Self Care | Payer: PPO | Source: Ambulatory Visit | Attending: Radiation Oncology | Admitting: Radiation Oncology

## 2019-07-23 ENCOUNTER — Encounter: Payer: Self-pay | Admitting: *Deleted

## 2019-07-23 DIAGNOSIS — Z510 Encounter for antineoplastic radiation therapy: Secondary | ICD-10-CM | POA: Diagnosis not present

## 2019-07-23 DIAGNOSIS — C50411 Malignant neoplasm of upper-outer quadrant of right female breast: Secondary | ICD-10-CM | POA: Diagnosis not present

## 2019-07-24 ENCOUNTER — Ambulatory Visit
Admission: RE | Admit: 2019-07-24 | Discharge: 2019-07-24 | Disposition: A | Discharge status: Home / Self Care | Payer: PPO | Source: Ambulatory Visit | Attending: Radiation Oncology | Admitting: Radiation Oncology

## 2019-07-24 DIAGNOSIS — Z510 Encounter for antineoplastic radiation therapy: Secondary | ICD-10-CM | POA: Diagnosis not present

## 2019-07-24 DIAGNOSIS — C50411 Malignant neoplasm of upper-outer quadrant of right female breast: Secondary | ICD-10-CM | POA: Diagnosis not present

## 2019-07-25 ENCOUNTER — Ambulatory Visit
Admission: RE | Admit: 2019-07-25 | Discharge: 2019-07-25 | Disposition: A | Discharge status: Home / Self Care | Payer: PPO | Source: Ambulatory Visit | Attending: Radiation Oncology | Admitting: Radiation Oncology

## 2019-07-25 DIAGNOSIS — C50411 Malignant neoplasm of upper-outer quadrant of right female breast: Secondary | ICD-10-CM | POA: Diagnosis not present

## 2019-07-25 DIAGNOSIS — Z510 Encounter for antineoplastic radiation therapy: Secondary | ICD-10-CM | POA: Diagnosis not present

## 2019-07-25 NOTE — Research (Unsigned)
Spoke with patient Jennifer Clayton by phone yesterday afternoon regarding the [facility] [phone] UPBEAT study. Ms. Feeser had previously been given a copy of the Informed Consent form by Dr. Rogue Bussing; however the patient has decided to decline participation in the UPBEAT study. She did voice interest in the DCP-001 study, so I met with the patient this morning to provide a brief overview of the study and give her a copy of the informed consent form and worksheet to review. Patient was informed that participation is voluntary, that she will not be paid for participation, and that we will be gathering some information about her cancer, why she declined to participate in the UPBEAT study, and additional information contained in the worksheet. Plans were made for me to touch base with the patient in a couple of days to determine her interest in the DCP-001 study. My contact information was also provided to the patient in the event that she has questions about the study. Yolande Jolly, BSN, MHA, OCN 07/25/2019  9:25 AM

## 2019-07-26 ENCOUNTER — Ambulatory Visit
Admission: RE | Admit: 2019-07-26 | Discharge: 2019-07-26 | Disposition: A | Discharge status: Home / Self Care | Payer: PPO | Source: Ambulatory Visit | Attending: Radiation Oncology | Admitting: Radiation Oncology

## 2019-07-26 DIAGNOSIS — C50411 Malignant neoplasm of upper-outer quadrant of right female breast: Secondary | ICD-10-CM | POA: Diagnosis not present

## 2019-07-26 DIAGNOSIS — Z510 Encounter for antineoplastic radiation therapy: Secondary | ICD-10-CM | POA: Diagnosis not present

## 2019-07-29 ENCOUNTER — Ambulatory Visit
Admission: RE | Admit: 2019-07-29 | Discharge: 2019-07-29 | Disposition: A | Discharge status: Home / Self Care | Payer: PPO | Source: Ambulatory Visit | Attending: Radiation Oncology | Admitting: Radiation Oncology

## 2019-07-29 DIAGNOSIS — C50411 Malignant neoplasm of upper-outer quadrant of right female breast: Secondary | ICD-10-CM | POA: Diagnosis not present

## 2019-07-29 DIAGNOSIS — Z510 Encounter for antineoplastic radiation therapy: Secondary | ICD-10-CM | POA: Diagnosis not present

## 2019-07-29 NOTE — Research (Signed)
Spoke with patient Jennifer Clayton by phone yesterday afternoon regarding the [facility] [phone] UPBEAT study. Ms. Groeneveld had previously been given a copy of the Informed Consent form by Dr. Rogue Bussing; however the patient has decided to decline participation in the UPBEAT study. She did voice interest in the DCP-001 study, so I met with the patient this morning to provide a brief overview of the study and give her a copy of the informed consent form and worksheet to review. Patient was informed that participation is voluntary, that she will not be paid for participation, and that we will be gathering some information about her cancer, why she declined to participate in the UPBEAT study, and additional information contained in the worksheet. Plans were made for me to touch base with the patient in a couple of days to determine her interest in the DCP-001 study. My contact information was also provided to the patient in the event that she has questions about the study. Yolande Jolly, BSN, MHA, OCN 07/25/2019  9:25 AM   Met with patient Jennifer Clayton this morning prior to her radiation therapy to discuss her decision about the DCP-001 study. Patient states she read the consent form over the weekend and has decided she would rather not participate in this study. She is open to being approached to other studies she may be eligible for in the future. Yolande Jolly, BSN, MHA, OCN 07/29/2019 9:01 AM

## 2019-07-30 ENCOUNTER — Ambulatory Visit
Admission: RE | Admit: 2019-07-30 | Discharge: 2019-07-30 | Disposition: A | Discharge status: Home / Self Care | Payer: PPO | Source: Ambulatory Visit | Attending: Radiation Oncology | Admitting: Radiation Oncology

## 2019-07-30 DIAGNOSIS — C50411 Malignant neoplasm of upper-outer quadrant of right female breast: Secondary | ICD-10-CM | POA: Diagnosis not present

## 2019-07-30 DIAGNOSIS — Z510 Encounter for antineoplastic radiation therapy: Secondary | ICD-10-CM | POA: Diagnosis not present

## 2019-07-31 ENCOUNTER — Other Ambulatory Visit: Payer: Self-pay

## 2019-07-31 ENCOUNTER — Ambulatory Visit
Admission: RE | Admit: 2019-07-31 | Discharge: 2019-07-31 | Disposition: A | Discharge status: Home / Self Care | Payer: PPO | Source: Ambulatory Visit | Attending: Radiation Oncology | Admitting: Radiation Oncology

## 2019-07-31 ENCOUNTER — Inpatient Hospital Stay: Payer: PPO

## 2019-07-31 DIAGNOSIS — C50411 Malignant neoplasm of upper-outer quadrant of right female breast: Secondary | ICD-10-CM

## 2019-07-31 DIAGNOSIS — Z510 Encounter for antineoplastic radiation therapy: Secondary | ICD-10-CM | POA: Diagnosis not present

## 2019-07-31 LAB — CBC
HCT: 40.4 % (ref 36.0–46.0)
Hemoglobin: 13.5 g/dL (ref 12.0–15.0)
MCH: 30.9 pg (ref 26.0–34.0)
MCHC: 33.4 g/dL (ref 30.0–36.0)
MCV: 92.4 fL (ref 80.0–100.0)
Platelets: 225 10*3/uL (ref 150–400)
RBC: 4.37 MIL/uL (ref 3.87–5.11)
RDW: 12.1 % (ref 11.5–15.5)
WBC: 5.1 10*3/uL (ref 4.0–10.5)
nRBC: 0 % (ref 0.0–0.2)

## 2019-08-01 ENCOUNTER — Ambulatory Visit
Admission: RE | Admit: 2019-08-01 | Discharge: 2019-08-01 | Disposition: A | Discharge status: Home / Self Care | Payer: PPO | Source: Ambulatory Visit | Attending: Radiation Oncology | Admitting: Radiation Oncology

## 2019-08-01 DIAGNOSIS — Z170 Estrogen receptor positive status [ER+]: Secondary | ICD-10-CM | POA: Insufficient documentation

## 2019-08-01 DIAGNOSIS — Z510 Encounter for antineoplastic radiation therapy: Secondary | ICD-10-CM | POA: Insufficient documentation

## 2019-08-01 DIAGNOSIS — C50411 Malignant neoplasm of upper-outer quadrant of right female breast: Secondary | ICD-10-CM | POA: Diagnosis not present

## 2019-08-02 ENCOUNTER — Ambulatory Visit
Admission: RE | Admit: 2019-08-02 | Discharge: 2019-08-02 | Disposition: A | Discharge status: Home / Self Care | Payer: PPO | Source: Ambulatory Visit | Attending: Radiation Oncology | Admitting: Radiation Oncology

## 2019-08-02 DIAGNOSIS — Z510 Encounter for antineoplastic radiation therapy: Secondary | ICD-10-CM | POA: Diagnosis not present

## 2019-08-06 ENCOUNTER — Ambulatory Visit
Admission: RE | Admit: 2019-08-06 | Discharge: 2019-08-06 | Disposition: A | Discharge status: Home / Self Care | Payer: PPO | Source: Ambulatory Visit | Attending: Radiation Oncology | Admitting: Radiation Oncology

## 2019-08-06 DIAGNOSIS — Z510 Encounter for antineoplastic radiation therapy: Secondary | ICD-10-CM | POA: Diagnosis not present

## 2019-08-07 ENCOUNTER — Ambulatory Visit
Admission: RE | Admit: 2019-08-07 | Discharge: 2019-08-07 | Disposition: A | Discharge status: Home / Self Care | Payer: PPO | Source: Ambulatory Visit | Attending: Radiation Oncology | Admitting: Radiation Oncology

## 2019-08-07 DIAGNOSIS — Z510 Encounter for antineoplastic radiation therapy: Secondary | ICD-10-CM | POA: Diagnosis not present

## 2019-08-08 ENCOUNTER — Ambulatory Visit
Admission: RE | Admit: 2019-08-08 | Discharge: 2019-08-08 | Disposition: A | Discharge status: Home / Self Care | Payer: PPO | Source: Ambulatory Visit | Attending: Radiation Oncology | Admitting: Radiation Oncology

## 2019-08-08 DIAGNOSIS — Z510 Encounter for antineoplastic radiation therapy: Secondary | ICD-10-CM | POA: Diagnosis not present

## 2019-08-09 ENCOUNTER — Ambulatory Visit
Admission: RE | Admit: 2019-08-09 | Discharge: 2019-08-09 | Disposition: A | Discharge status: Home / Self Care | Payer: PPO | Source: Ambulatory Visit | Attending: Radiation Oncology | Admitting: Radiation Oncology

## 2019-08-09 DIAGNOSIS — Z510 Encounter for antineoplastic radiation therapy: Secondary | ICD-10-CM | POA: Diagnosis not present

## 2019-08-09 DIAGNOSIS — C50411 Malignant neoplasm of upper-outer quadrant of right female breast: Secondary | ICD-10-CM | POA: Diagnosis not present

## 2019-08-12 ENCOUNTER — Ambulatory Visit
Admission: RE | Admit: 2019-08-12 | Discharge: 2019-08-12 | Disposition: A | Discharge status: Home / Self Care | Payer: PPO | Source: Ambulatory Visit | Attending: Radiation Oncology | Admitting: Radiation Oncology

## 2019-08-12 DIAGNOSIS — Z510 Encounter for antineoplastic radiation therapy: Secondary | ICD-10-CM | POA: Diagnosis not present

## 2019-08-13 ENCOUNTER — Encounter: Payer: Self-pay | Admitting: Internal Medicine

## 2019-08-13 NOTE — Progress Notes (Signed)
Patient prescreened for appointment. Patient has no concerns or questions.  

## 2019-08-14 ENCOUNTER — Other Ambulatory Visit: Payer: Self-pay

## 2019-08-14 ENCOUNTER — Inpatient Hospital Stay: Payer: PPO | Attending: Internal Medicine | Admitting: Internal Medicine

## 2019-08-14 DIAGNOSIS — R232 Flushing: Secondary | ICD-10-CM | POA: Insufficient documentation

## 2019-08-14 DIAGNOSIS — Z170 Estrogen receptor positive status [ER+]: Secondary | ICD-10-CM | POA: Insufficient documentation

## 2019-08-14 DIAGNOSIS — Z85828 Personal history of other malignant neoplasm of skin: Secondary | ICD-10-CM | POA: Diagnosis not present

## 2019-08-14 DIAGNOSIS — Z79899 Other long term (current) drug therapy: Secondary | ICD-10-CM | POA: Insufficient documentation

## 2019-08-14 DIAGNOSIS — C50411 Malignant neoplasm of upper-outer quadrant of right female breast: Secondary | ICD-10-CM | POA: Diagnosis not present

## 2019-08-14 DIAGNOSIS — E039 Hypothyroidism, unspecified: Secondary | ICD-10-CM | POA: Insufficient documentation

## 2019-08-14 DIAGNOSIS — I10 Essential (primary) hypertension: Secondary | ICD-10-CM | POA: Diagnosis not present

## 2019-08-14 DIAGNOSIS — M810 Age-related osteoporosis without current pathological fracture: Secondary | ICD-10-CM | POA: Insufficient documentation

## 2019-08-14 DIAGNOSIS — Z7982 Long term (current) use of aspirin: Secondary | ICD-10-CM | POA: Diagnosis not present

## 2019-08-14 MED ORDER — LETROZOLE 2.5 MG PO TABS: 2.5000 mg | ORAL_TABLET | Freq: Every day | ORAL | 3 refills | Status: DC

## 2019-08-14 NOTE — Progress Notes (Signed)
[facility] [place] NOTE  Patient Care Team: Burnard Hawthorne, FNP as PCP - General (Family Medicine) Theodore Demark, RN as Oncology Nurse Navigator Noreene Filbert, MD as Radiation Oncologist (Radiation Oncology) Cammie Sickle, MD as Consulting Physician (Internal Medicine)  CHIEF COMPLAINTS/PURPOSE OF CONSULTATION: Breast cancer  #  Oncology History Overview Note  # MARCH 2021-right breast 11 o'clock position-s/p lumpectomy; SLNBx [Dr. Tollie Pizza ]pT1c (15 mm) p N0-grade-1; ER/PR positive HER-2 negative; Oncotype low risk [risk of recurrence is 5%]-no chemotherapy recommend radiation; antihormone  # SURVIVORSHIP:   # GENETICS:   DIAGNOSIS:   STAGE:         ;  GOALS:  CURRENT/MOST RECENT THERAPY :     Carcinoma of upper-outer quadrant of right breast in female, estrogen receptor positive ([place])  05/22/2019 Initial Diagnosis   Carcinoma of upper-outer quadrant of right breast in female, estrogen receptor positive ([place])      HISTORY OF PRESENTING ILLNESS:  Jennifer Clayton 64 y.o.  female stage I breast cancer ER/PR positive or negative is here for follow-up.  Patient is currently status post radiation.  Tolerated fairly well.  Denies any lumps or bumps.  No fever chills but appetite is good but no weight loss.   Review of Systems  Constitutional: Negative for chills, diaphoresis, fever, malaise/fatigue and weight loss.  HENT: Negative for nosebleeds and sore throat.   Eyes: Negative for double vision.  Respiratory: Negative for cough, hemoptysis, sputum production, shortness of breath and wheezing.   Cardiovascular: Negative for chest pain, palpitations, orthopnea and leg swelling.  Gastrointestinal: Negative for abdominal pain, blood in stool, constipation, diarrhea, heartburn, melena, nausea and vomiting.  Genitourinary: Negative for dysuria, frequency and urgency.  Musculoskeletal: Negative for back pain and joint pain.  Skin: Negative.  Negative  for itching and rash.  Neurological: Negative for dizziness, tingling, focal weakness, weakness and headaches.  Endo/Heme/Allergies: Does not bruise/bleed easily.  Psychiatric/Behavioral: Negative for depression. The patient is not nervous/anxious and does not have insomnia.      MEDICAL HISTORY:  Past Medical History:  Diagnosis Date  . Cancer ([place])    skin cancer (left leg)/BREAST CANCER  . H/O radioactive iodine thyroid ablation   . Headache    MIGRAINES  . Hypertension   . Hypothyroidism     SURGICAL HISTORY: Past Surgical History:  Procedure Laterality Date  . ABDOMINAL HYSTERECTOMY     Age 34, for bleeding and cyst; NO ovaries  . BREAST LUMPECTOMY WITH SENTINEL LYMPH NODE BIOPSY Right 05/13/2019   Procedure: BREAST LUMPECTOMY WITH SENTINEL LYMPH NODE BX;  Surgeon: Robert Bellow, MD;  Location: [facility] ORS;  Service: General;  Laterality: Right;  Marland Kitchen VAGINAL DELIVERY      SOCIAL HISTORY: Social History   Socioeconomic History  . Marital status: Married    Spouse name: Not on file  . Number of children: Not on file  . Years of education: Not on file  . Highest education level: Not on file  Occupational History  . Not on file  Tobacco Use  . Smoking status: Never Smoker  . Smokeless tobacco: Never Used  Vaping Use  . Vaping Use: Never used  Substance and Sexual Activity  . Alcohol use: Yes    Alcohol/week: 1.0 standard drink    Types: 1 Glasses of wine per week    Comment: rare  . Drug use: Never  . Sexual activity: Not on file  Other Topics Concern  . Not on file  Social History Narrative   Lives in [place] with husband and son and 4 grandchildren.      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       Never smoked; social alcohol.       Social Determinants of Health   Financial Resource Strain:   . Difficulty of Paying Living Expenses:    Food Insecurity:   . Worried About RCharity fundraiserin the Last Year:   . RArboriculturistin the Last Year:   Transportation Needs:   . LFilm/video editor(Medical):   .Marland KitchenLack of Transportation (Non-Medical):   Physical Activity:   . Days of Exercise per Week:   . Minutes of Exercise per Session:   Stress:   . Feeling of Stress :   Social Connections:   . Frequency of Communication with Friends and Family:   . Frequency of Social Gatherings with Friends and Family:   . Attends Religious Services:   . Active Member of Clubs or Organizations:   . Attends CArchivistMeetings:   .Marland KitchenMarital Status:   Intimate Partner Violence:   . Fear of Current or Ex-Partner:   . Emotionally Abused:   .Marland KitchenPhysically Abused:   . Sexually Abused:     FAMILY HISTORY: Family History  Problem Relation Age of Onset  . Cancer Mother        lung, non-smoker  . Dementia Father        related to traumatic brain injury  . Breast cancer Neg Hx     ALLERGIES:  is allergic to zomepirac, amoxicillin-pot clavulanate, and other.  MEDICATIONS:  Current Outpatient Medications  Medication Sig Dispense Refill  . amLODipine (NORVASC) 5 MG tablet Take 1 tablet (5 mg total) by mouth daily. (Patient taking differently: Take 5 mg by mouth at bedtime. ) 90 tablet 3  . Ascorbic Acid (VITAMIN C PO) Take 1 tablet by mouth daily.    .Marland Kitchenaspirin EC 81 MG tablet Take 81 mg by mouth every other day.    .Marland KitchenbuPROPion (WELLBUTRIN XL) 300 MG 24 hr tablet Take 1 tablet (300 mg total) by mouth daily. (Patient taking differently: Take 300 mg by mouth every morning. ) 90 tablet 1  . levothyroxine (SYNTHROID) 100 MCG tablet TAKE 1 TABLET EVERY DAY ON EMPTY STOMACHWITH A GLASS OF WATER AT LEAST 30-60 MINBEFORE BREAKFAST (Patient taking differently: Take 100 mcg by mouth daily before breakfast. ) 90 tablet 0  . Multiple Vitamins-Minerals (MULTIVITAMIN ADULT PO) Take 1 tablet by mouth daily.    . SUMAtriptan (IMITREX) 50  MG tablet TAKE 1 TABLET BY MOUTH ONCE-MAY REPEAT ONCE IN 2 HOURS IF NECESSARY (Patient taking differently: Take 12.5 mg by mouth every 2 (two) hours as needed for migraine or headache. ) 15 tablet 2  . zolpidem (AMBIEN) 5 MG tablet TAKE 1 TABLET BY MOUTH AT BEDTIME 30 tablet 1  . letrozole (FEMARA) 2.5 MG tablet Take 1 tablet (2.5 mg total) by mouth daily. Once a day. 90 tablet 3   No current facility-administered medications for this visit.      .Marland Kitchen PHYSICAL EXAMINATION: ECOG PERFORMANCE STATUS: 0 - Asymptomatic  Vitals:   08/14/19 1007  BP: 114/72  Pulse: 73  Resp: 18  Temp: 98 F (36.7 C)  SpO2: 100%  Filed Weights   08/14/19 1007  Weight: 126 lb (57.2 kg)    Physical Exam HENT:     Head: Normocephalic and atraumatic.     Mouth/Throat:     Pharynx: No oropharyngeal exudate.  Eyes:     Pupils: Pupils are equal, round, and reactive to light.  Cardiovascular:     Rate and Rhythm: Normal rate and regular rhythm.  Pulmonary:     Effort: Pulmonary effort is normal. No respiratory distress.     Breath sounds: Normal breath sounds. No wheezing.  Abdominal:     General: Bowel sounds are normal. There is no distension.     Palpations: Abdomen is soft. There is no mass.     Tenderness: There is no abdominal tenderness. There is no guarding or rebound.  Musculoskeletal:        General: No tenderness. Normal range of motion.     Cervical back: Normal range of motion and neck supple.  Skin:    General: Skin is warm.  Neurological:     Mental Status: She is alert and oriented to person, place, and time.  Psychiatric:        Mood and Affect: Affect normal.      LABORATORY DATA:  I have reviewed the data as listed Lab Results  Component Value Date   WBC 5.1 07/31/2019   HGB 13.5 07/31/2019   HCT 40.4 07/31/2019   MCV 92.4 07/31/2019   PLT 225 07/31/2019   No results for input(s): NA, K, CL, CO2, GLUCOSE, BUN, CREATININE, CALCIUM, GFRNONAA, GFRAA, PROT, ALBUMIN,  AST, ALT, ALKPHOS, BILITOT, BILIDIR, IBILI in the last 8760 hours.  RADIOGRAPHIC STUDIES: I have personally reviewed the radiological images as listed and agreed with the findings in the report. No results found.  ASSESSMENT & PLAN:   Carcinoma of upper-outer quadrant of right breast in female, estrogen receptor positive ([place]) #Invasive mammary carcinoma right breast-pT1cpN0; grade 1 ER positive PR negative HER-2 negative.  Status post lumpectomy; s/p RT [08/12/2019]- Oncotyoe- 5% risk of recurrence. No chemo  # Discussed the use of antihormone therapy like letrozole; for total of 5 years. #Discussed the mechanism of action of aromatase inhibitors-with blocking of estrogen to prevent breast cancer.  Also discussed the potential side effects including but not limited to arthralgias hot flashes and increased risk of osteoporosis. Prescription started.  # OSTEOPOROSIS [BMD-2021]-T score -3; continue calcium plus vitamin D plus exercise program.  Discussed the potential risk factors for osteoporosis- age/gender/postmenopausal status/use of anti-estrogen treatments. Discussed multiple options including exercise/ calcium and vitamin D supplementation/ and also use of bisphosphonates. Discussed oral bisphosphonates versus parenteral bisphosphonate like Reclast/ RANK ligand inhibitors- desnosumab. Discussed the potential benefits and/side effects  Including but not limited to Osteonecrosis of jaw/ hypocalcemia.   # DISPOSITION: # Follow up in 2 months- MD;labs- cbc/cbmp;Prolia SQ- Dr.B  All questions were answered. The patient/family knows to call the clinic with any problems, questions or concerns.    Cammie Sickle, MD 08/14/2019 9:30 PM

## 2019-08-14 NOTE — Assessment & Plan Note (Addendum)
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  Status post lumpectomy; s/p RT [08/12/2019]- Oncotyoe- 5% risk of recurrence. No chemo  # Discussed the use of antihormone therapy like letrozole; for total of 5 years. #Discussed the mechanism of action of aromatase inhibitors-with blocking of estrogen to prevent breast cancer.  Also discussed the potential side effects including but not limited to arthralgias hot flashes and increased risk of osteoporosis. Prescription started.  # OSTEOPOROSIS [BMD-2021]-T score -3; continue calcium plus vitamin D plus exercise program.  Discussed the potential risk factors for osteoporosis- age/gender/postmenopausal status/use of anti-estrogen treatments. Discussed multiple options including exercise/ calcium and vitamin D supplementation/ and also use of bisphosphonates. Discussed oral bisphosphonates versus parenteral bisphosphonate like Reclast/ RANK ligand inhibitors- desnosumab. Discussed the potential benefits and/side effects  Including but not limited to Osteonecrosis of jaw/ hypocalcemia.   # DISPOSITION: # Follow up in 2 months- MD;labs- cbc/cbmp;Prolia SQ- Dr.B

## 2019-09-06 ENCOUNTER — Other Ambulatory Visit: Payer: Self-pay | Admitting: Family

## 2019-09-06 DIAGNOSIS — R635 Abnormal weight gain: Secondary | ICD-10-CM

## 2019-09-09 ENCOUNTER — Other Ambulatory Visit: Payer: Self-pay | Admitting: Family

## 2019-09-09 DIAGNOSIS — G4700 Insomnia, unspecified: Secondary | ICD-10-CM

## 2019-09-12 DIAGNOSIS — Z853 Personal history of malignant neoplasm of breast: Secondary | ICD-10-CM | POA: Diagnosis not present

## 2019-09-13 ENCOUNTER — Ambulatory Visit: Payer: PPO | Admitting: Radiation Oncology

## 2019-09-26 ENCOUNTER — Encounter: Payer: Self-pay | Admitting: Radiation Oncology

## 2019-09-26 ENCOUNTER — Other Ambulatory Visit: Payer: Self-pay

## 2019-09-26 ENCOUNTER — Ambulatory Visit
Admission: RE | Admit: 2019-09-26 | Discharge: 2019-09-26 | Disposition: A | Discharge status: Home / Self Care | Payer: PPO | Source: Ambulatory Visit | Attending: Radiation Oncology | Admitting: Radiation Oncology

## 2019-09-26 VITALS — BP 123/72 | HR 66 | Temp 97.9°F | Wt 127.9 lb

## 2019-09-26 DIAGNOSIS — Z923 Personal history of irradiation: Secondary | ICD-10-CM | POA: Diagnosis not present

## 2019-09-26 DIAGNOSIS — Z79811 Long term (current) use of aromatase inhibitors: Secondary | ICD-10-CM | POA: Diagnosis not present

## 2019-09-26 DIAGNOSIS — Z170 Estrogen receptor positive status [ER+]: Secondary | ICD-10-CM | POA: Diagnosis not present

## 2019-09-26 DIAGNOSIS — C50411 Malignant neoplasm of upper-outer quadrant of right female breast: Secondary | ICD-10-CM | POA: Insufficient documentation

## 2019-09-26 NOTE — Progress Notes (Signed)
Radiation Oncology Follow up Note  Name: Jennifer Clayton   Date:   09/26/2019 MRN:  [id number] DOB: [birth year]    This 64 y.o. female presents to the clinic today for 1 month follow-up status post whole breast radiation to her right breast for stage I ER/PR positive invasive mammary carcinoma.  REFERRING PROVIDER: Burnard Hawthorne, FNP  HPI: Patient is a 64 year old female now at 1 month having completed whole breast radiation to her right breast for stage I invasive mammary carcinoma ER/PR positive.  Seen today in routine follow-up she is doing well she specifically denies breast tenderness cough or bone pain.  She has been started on.  Letrozole tolerating that well without side effect.  COMPLICATIONS OF TREATMENT: none  FOLLOW UP COMPLIANCE: keeps appointments   PHYSICAL EXAM:  BP 123/72 (BP Location: Left Arm, Patient Position: Sitting, Cuff Size: Normal)   Pulse 66   Temp 97.9 F (36.6 C) (Tympanic)   Wt 127 lb 14.4 oz (58 kg)   BMI 22.66 kg/m  Lungs are clear to A&P cardiac examination essentially unremarkable with regular rate and rhythm. No dominant mass or nodularity is noted in either breast in 2 positions examined. Incision is well-healed. No axillary or supraclavicular adenopathy is appreciated. Cosmetic result is excellent.  Well-developed well-nourished patient in NAD. HEENT reveals PERLA, EOMI, discs not visualized.  Oral cavity is clear. No oral mucosal lesions are identified. Neck is clear without evidence of cervical or supraclavicular adenopathy. Lungs are clear to A&P. Cardiac examination is essentially unremarkable with regular rate and rhythm without murmur rub or thrill. Abdomen is benign with no organomegaly or masses noted. Motor sensory and DTR levels are equal and symmetric in the upper and lower extremities. Cranial nerves II through XII are grossly intact. Proprioception is intact. No peripheral adenopathy or edema is identified. No motor or sensory levels are  noted. Crude visual fields are within normal range.  RADIOLOGY RESULTS: No current films to review  PLAN: Present time patient is doing well 1 month out very low side effect profile.  She is currently on letrozole tolerating it well.  I have asked to see her back in 4 to 5 months for follow-up.  Patient knows to call with any concerns.  I would like to take this opportunity to thank you for allowing me to participate in the care of your patient.Noreene Filbert, MD

## 2019-10-14 NOTE — Progress Notes (Signed)
1615-10/14/2019- Called patient for prescreening questions prior to md apt. No answer. Unable to leave vm.

## 2019-10-15 ENCOUNTER — Inpatient Hospital Stay: Payer: PPO | Attending: Internal Medicine

## 2019-10-15 ENCOUNTER — Inpatient Hospital Stay: Payer: PPO

## 2019-10-15 ENCOUNTER — Encounter: Payer: Self-pay | Admitting: Internal Medicine

## 2019-10-15 ENCOUNTER — Other Ambulatory Visit: Payer: Self-pay

## 2019-10-15 ENCOUNTER — Inpatient Hospital Stay (HOSPITAL_BASED_OUTPATIENT_CLINIC_OR_DEPARTMENT_OTHER): Payer: PPO | Admitting: Internal Medicine

## 2019-10-15 DIAGNOSIS — Z170 Estrogen receptor positive status [ER+]: Secondary | ICD-10-CM

## 2019-10-15 DIAGNOSIS — I10 Essential (primary) hypertension: Secondary | ICD-10-CM | POA: Diagnosis not present

## 2019-10-15 DIAGNOSIS — Z79811 Long term (current) use of aromatase inhibitors: Secondary | ICD-10-CM | POA: Insufficient documentation

## 2019-10-15 DIAGNOSIS — R232 Flushing: Secondary | ICD-10-CM | POA: Diagnosis not present

## 2019-10-15 DIAGNOSIS — M810 Age-related osteoporosis without current pathological fracture: Secondary | ICD-10-CM | POA: Diagnosis not present

## 2019-10-15 DIAGNOSIS — Z7982 Long term (current) use of aspirin: Secondary | ICD-10-CM | POA: Diagnosis not present

## 2019-10-15 DIAGNOSIS — Z85828 Personal history of other malignant neoplasm of skin: Secondary | ICD-10-CM | POA: Diagnosis not present

## 2019-10-15 DIAGNOSIS — E039 Hypothyroidism, unspecified: Secondary | ICD-10-CM | POA: Diagnosis not present

## 2019-10-15 DIAGNOSIS — C50411 Malignant neoplasm of upper-outer quadrant of right female breast: Secondary | ICD-10-CM

## 2019-10-15 DIAGNOSIS — Z801 Family history of malignant neoplasm of trachea, bronchus and lung: Secondary | ICD-10-CM | POA: Insufficient documentation

## 2019-10-15 DIAGNOSIS — M2550 Pain in unspecified joint: Secondary | ICD-10-CM | POA: Insufficient documentation

## 2019-10-15 DIAGNOSIS — Z79899 Other long term (current) drug therapy: Secondary | ICD-10-CM | POA: Diagnosis not present

## 2019-10-15 LAB — BASIC METABOLIC PANEL
Anion gap: 8 (ref 5–15)
BUN: 12 mg/dL (ref 8–23)
CO2: 28 mmol/L (ref 22–32)
Calcium: 8.8 mg/dL — ABNORMAL LOW (ref 8.9–10.3)
Chloride: 100 mmol/L (ref 98–111)
Creatinine, Ser: 0.77 mg/dL (ref 0.44–1.00)
GFR calc Af Amer: >60 mL/min (ref >60)
GFR calc non Af Amer: >60 mL/min (ref >60)
Glucose, Bld: 128 mg/dL — ABNORMAL HIGH (ref 70–99)
Potassium: 4.5 mmol/L (ref 3.5–5.1)
Sodium: 136 mmol/L (ref 135–145)

## 2019-10-15 LAB — CBC WITH DIFFERENTIAL/PLATELET
Abs Immature Granulocytes: 0.03 10*3/uL (ref 0.00–0.07)
Basophils Absolute: 0 10*3/uL (ref 0.0–0.1)
Basophils Relative: 1 %
Eosinophils Absolute: 0.1 10*3/uL (ref 0.0–0.5)
Eosinophils Relative: 3 %
HCT: 39.5 % (ref 36.0–46.0)
Hemoglobin: 13.3 g/dL (ref 12.0–15.0)
Immature Granulocytes: 1 %
Lymphocytes Relative: 28 %
Lymphs Abs: 1.3 10*3/uL (ref 0.7–4.0)
MCH: 31.4 pg (ref 26.0–34.0)
MCHC: 33.7 g/dL (ref 30.0–36.0)
MCV: 93.4 fL (ref 80.0–100.0)
Monocytes Absolute: 0.5 10*3/uL (ref 0.1–1.0)
Monocytes Relative: 10 %
Neutro Abs: 2.7 10*3/uL (ref 1.7–7.7)
Neutrophils Relative %: 57 %
Platelets: 239 10*3/uL (ref 150–400)
RBC: 4.23 MIL/uL (ref 3.87–5.11)
RDW: 11.9 % (ref 11.5–15.5)
WBC: 4.7 10*3/uL (ref 4.0–10.5)
nRBC: 0 % (ref 0.0–0.2)

## 2019-10-15 MED ORDER — DENOSUMAB 60 MG/ML ~~LOC~~ SOSY
60.0000 mg | PREFILLED_SYRINGE | Freq: Once | SUBCUTANEOUS | Status: AC
  Administered 2019-10-15: 60 mg via SUBCUTANEOUS
  Filled 2019-10-15: qty 1

## 2019-10-15 NOTE — Assessment & Plan Note (Addendum)
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  On adjuvant Letrozole.  Tolerating well no major side effects noted except for mild joint pains/hot flashes.  # joint pains- G-1 sec to letrozole.  Monitor closely.  # Hot flashes- G-1 sec to letrozole.  Monitor closely.  # OSTEOPOROSIS [BMD-2021]-T score -3; continue calcium plus vitamin D plus exercise program.  Desnosumab. Discussed the potential benefits and/side effects  Including but not limited to Osteonecrosis of jaw/ hypocalcemia.  Today calcium 8.8; incarese Vit D 1000/day.  Proceed with Prolia today.  # DISPOSITION: # Prolia today # Follow up in 6 months- MD; labs- BMP;prolia SQ-  Dr.B

## 2019-10-15 NOTE — Progress Notes (Signed)
one [place] NOTE  Patient Care Team: Burnard Hawthorne, FNP as PCP - General (Family Medicine) Theodore Demark, RN as Oncology Nurse Navigator Noreene Filbert, MD as Radiation Oncologist (Radiation Oncology) Cammie Sickle, MD as Consulting Physician (Internal Medicine)  CHIEF COMPLAINTS/PURPOSE OF CONSULTATION: Breast cancer  #  Oncology History Overview Note  # MARCH 2021-right breast 11 o'clock position-s/p lumpectomy; SLNBx [Dr. Tollie Pizza ]pT1c (15 mm) p N0-grade-1; ER/PR positive HER-2 negative; Oncotype low risk [risk of recurrence is 5%]-no chemotherapy; s/p RT [08/12/2019]- Oncotyoe- 5% risk of recurrence. No chemo.   #Mid July 2021-letrozole  #Osteoporosis 2021-Prolia  # SURVIVORSHIP:   # GENETICS:   DIAGNOSIS: Right breast cancer  STAGE:   1      ;  GOALS: Cure  CURRENT/MOST RECENT THERAPY : Letrozole    Carcinoma of upper-outer quadrant of right breast in female, estrogen receptor positive ([place])  05/22/2019 Initial Diagnosis   Carcinoma of upper-outer quadrant of right breast in female, estrogen receptor positive ([place])      HISTORY OF PRESENTING ILLNESS:  Jennifer Clayton 64 y.o.  female stage I breast cancer ER/PR positive Her-2 negative is here for follow-up/proceed with Prolia.  Patient currently on letrozole.  Complains of mild joint pains.  Mild hot flashes not any worse.  No new lumps or bumps.  Appetite is good.  No weight loss.   Review of Systems  Constitutional: Negative for chills, diaphoresis, fever, malaise/fatigue and weight loss.  HENT: Negative for nosebleeds and sore throat.   Eyes: Negative for double vision.  Respiratory: Negative for cough, hemoptysis, sputum production, shortness of breath and wheezing.   Cardiovascular: Negative for chest pain, palpitations, orthopnea and leg swelling.  Gastrointestinal: Negative for abdominal pain, blood in stool, constipation, diarrhea, heartburn, melena, nausea and  vomiting.  Genitourinary: Negative for dysuria, frequency and urgency.  Musculoskeletal: Positive for joint pain. Negative for back pain.  Skin: Negative.  Negative for itching and rash.  Neurological: Negative for dizziness, tingling, focal weakness, weakness and headaches.  Endo/Heme/Allergies: Does not bruise/bleed easily.  Psychiatric/Behavioral: Negative for depression. The patient is not nervous/anxious and does not have insomnia.      MEDICAL HISTORY:  Past Medical History:  Diagnosis Date  . Cancer ([place])    skin cancer (left leg)/BREAST CANCER  . H/O radioactive iodine thyroid ablation   . Headache    MIGRAINES  . Hypertension   . Hypothyroidism     SURGICAL HISTORY: Past Surgical History:  Procedure Laterality Date  . ABDOMINAL HYSTERECTOMY     Age 51, for bleeding and cyst; NO ovaries  . BREAST LUMPECTOMY WITH SENTINEL LYMPH NODE BIOPSY Right 05/13/2019   Procedure: BREAST LUMPECTOMY WITH SENTINEL LYMPH NODE BX;  Surgeon: Robert Bellow, MD;  Location: [facility] ORS;  Service: General;  Laterality: Right;  Marland Kitchen VAGINAL DELIVERY      SOCIAL HISTORY: Social History   Socioeconomic History  . Marital status: Married    Spouse name: Not on file  . Number of children: Not on file  . Years of education: Not on file  . Highest education level: Not on file  Occupational History  . Not on file  Tobacco Use  . Smoking status: Never Smoker  . Smokeless tobacco: Never Used  Vaping Use  . Vaping Use: Never used  Substance and Sexual Activity  . Alcohol use: Yes    Alcohol/week: 1.0 standard drink    Types: 1 Glasses of wine per week  Comment: rare  . Drug use: Never  . Sexual activity: Not on file  Other Topics Concern  . Not on file  Social History Narrative   Lives in [place] with husband and son and 4 grandchildren.      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       Never smoked; social alcohol.       Social Determinants of Health   Financial Resource Strain:   . Difficulty of Paying Living Expenses: Not on file  Food Insecurity:   . Worried About Charity fundraiser in the Last Year: Not on file  . Ran Out of Food in the Last Year: Not on file  Transportation Needs:   . Lack of Transportation (Medical): Not on file  . Lack of Transportation (Non-Medical): Not on file  Physical Activity:   . Days of Exercise per Week: Not on file  . Minutes of Exercise per Session: Not on file  Stress:   . Feeling of Stress : Not on file  Social Connections:   . Frequency of Communication with Friends and Family: Not on file  . Frequency of Social Gatherings with Friends and Family: Not on file  . Attends Religious Services: Not on file  . Active Member of Clubs or Organizations: Not on file  . Attends Archivist Meetings: Not on file  . Marital Status: Not on file  Intimate Partner Violence:   . Fear of Current or Ex-Partner: Not on file  . Emotionally Abused: Not on file  . Physically Abused: Not on file  . Sexually Abused: Not on file    FAMILY HISTORY: Family History  Problem Relation Age of Onset  . Cancer Mother        lung, non-smoker  . Dementia Father        related to traumatic brain injury  . Breast cancer Neg Hx     ALLERGIES:  is allergic to zomepirac, amoxicillin-pot clavulanate, and other.  MEDICATIONS:  Current Outpatient Medications  Medication Sig Dispense Refill  . amLODipine (NORVASC) 5 MG tablet Take 1 tablet (5 mg total) by mouth daily. (Patient taking differently: Take 5 mg by mouth at bedtime. ) 90 tablet 3  . Ascorbic Acid (VITAMIN C PO) Take 1 tablet by mouth daily.    Marland Kitchen aspirin EC 81 MG tablet Take 81 mg by mouth every other day.    Marland Kitchen buPROPion (WELLBUTRIN XL) 300 MG 24 hr tablet TAKE 1 TABLET BY MOUTH DAILY 90 tablet 1  . letrozole (FEMARA) 2.5 MG tablet Take 1 tablet (2.5 mg  total) by mouth daily. Once a day. 90 tablet 3  . levothyroxine (SYNTHROID) 100 MCG tablet TAKE 1 TABLET EVERY DAY ON EMPTY STOMACHWITH A GLASS OF WATER AT LEAST 30-60 MINBEFORE BREAKFAST (Patient taking differently: Take 100 mcg by mouth daily before breakfast. ) 90 tablet 0  . Multiple Vitamins-Minerals (MULTIVITAMIN ADULT PO) Take 1 tablet by mouth daily.    . SUMAtriptan (IMITREX) 50 MG tablet TAKE 1 TABLET BY MOUTH ONCE-MAY REPEAT ONCE IN 2 HOURS IF NECESSARY (Patient taking differently: Take 12.5 mg by mouth every 2 (two) hours as needed for migraine or headache. ) 15 tablet 2  . zolpidem (AMBIEN) 5 MG tablet TAKE 1 TABLET BY MOUTH AT BEDTIME 30 tablet 1  . calcium carbonate (  OS-CAL - DOSED IN MG OF ELEMENTAL CALCIUM) 1250 (500 Ca) MG tablet Take 1 tablet by mouth daily.     No current facility-administered medications for this visit.   Facility-Administered Medications Ordered in Other Visits  Medication Dose Route Frequency Provider Last Rate Last Admin  . denosumab (PROLIA) injection 60 mg  60 mg Subcutaneous Once Cammie Sickle, MD          .  PHYSICAL EXAMINATION: ECOG PERFORMANCE STATUS: 0 - Asymptomatic  Vitals:   10/15/19 1008  BP: 120/68  Pulse: 70  Resp: 20  Temp: (!) 97.1 F (36.2 C)   Filed Weights   10/15/19 1008  Weight: 123 lb (55.8 kg)    Physical Exam HENT:     Head: Normocephalic and atraumatic.     Mouth/Throat:     Pharynx: No oropharyngeal exudate.  Eyes:     Pupils: Pupils are equal, round, and reactive to light.  Cardiovascular:     Rate and Rhythm: Normal rate and regular rhythm.  Pulmonary:     Effort: Pulmonary effort is normal. No respiratory distress.     Breath sounds: Normal breath sounds. No wheezing.  Abdominal:     General: Bowel sounds are normal. There is no distension.     Palpations: Abdomen is soft. There is no mass.     Tenderness: There is no abdominal tenderness. There is no guarding or rebound.  Musculoskeletal:         General: No tenderness. Normal range of motion.     Cervical back: Normal range of motion and neck supple.  Skin:    General: Skin is warm.  Neurological:     Mental Status: She is alert and oriented to person, place, and time.  Psychiatric:        Mood and Affect: Affect normal.      LABORATORY DATA:  I have reviewed the data as listed Lab Results  Component Value Date   WBC 4.7 10/15/2019   HGB 13.3 10/15/2019   HCT 39.5 10/15/2019   MCV 93.4 10/15/2019   PLT 239 10/15/2019   Recent Labs    10/15/19 0953  NA 136  K 4.5  CL 100  CO2 28  GLUCOSE 128*  BUN 12  CREATININE 0.77  CALCIUM 8.8*  GFRNONAA >60  GFRAA >60    RADIOGRAPHIC STUDIES: I have personally reviewed the radiological images as listed and agreed with the findings in the report. No results found.  ASSESSMENT & PLAN:   Carcinoma of upper-outer quadrant of right breast in female, estrogen receptor positive ([place]) #Invasive mammary carcinoma right breast-pT1cpN0; grade 1 ER positive PR negative HER-2 negative.  On adjuvant Letrozole.  Tolerating well no major side effects noted except for mild joint pains/hot flashes.  # joint pains- G-1 sec to letrozole.  Monitor closely.  # Hot flashes- G-1 sec to letrozole.  Monitor closely.  # OSTEOPOROSIS [BMD-2021]-T score -3; continue calcium plus vitamin D plus exercise program.  Desnosumab. Discussed the potential benefits and/side effects  Including but not limited to Osteonecrosis of jaw/ hypocalcemia.  Today calcium 8.8; incarese Vit D 1000/day.  Proceed with Prolia today.  # DISPOSITION: # Prolia today # Follow up in 6 months- MD; labs- BMP;prolia SQ-  Dr.B  All questions were answered. The patient/family knows to call the clinic with any problems, questions or concerns.    Cammie Sickle, MD 10/15/2019 10:39 AM

## 2019-10-18 ENCOUNTER — Other Ambulatory Visit: Payer: Self-pay | Admitting: Family

## 2019-10-18 DIAGNOSIS — G43919 Migraine, unspecified, intractable, without status migrainosus: Secondary | ICD-10-CM

## 2019-10-24 ENCOUNTER — Encounter: Payer: Self-pay | Admitting: Family

## 2019-10-24 ENCOUNTER — Other Ambulatory Visit: Payer: Self-pay

## 2019-10-24 DIAGNOSIS — G43919 Migraine, unspecified, intractable, without status migrainosus: Secondary | ICD-10-CM

## 2019-10-24 MED ORDER — SUMATRIPTAN SUCCINATE 50 MG PO TABS: ORAL_TABLET | ORAL | 2 refills | Status: DC

## 2019-11-13 ENCOUNTER — Ambulatory Visit: Payer: PPO | Admitting: Internal Medicine

## 2019-11-20 ENCOUNTER — Telehealth: Payer: Self-pay | Admitting: Family

## 2019-11-20 NOTE — Telephone Encounter (Signed)
Left message for patient to call back and schedule Medicare Annual Wellness Visit (AWV)  ° °This should be a telephone visit only=30 minutes. ° °No hx of AWV; please schedule at anytime with Denisa O'Brien-Blaney at [place] ° ° °

## 2019-11-27 ENCOUNTER — Ambulatory Visit (INDEPENDENT_AMBULATORY_CARE_PROVIDER_SITE_OTHER): Payer: PPO | Admitting: Family

## 2019-11-27 ENCOUNTER — Encounter: Payer: Self-pay | Admitting: Family

## 2019-11-27 ENCOUNTER — Other Ambulatory Visit: Payer: Self-pay

## 2019-11-27 VITALS — BP 112/76 | HR 73 | Temp 97.5°F | Ht 63.0 in | Wt 127.0 lb

## 2019-11-27 DIAGNOSIS — G43809 Other migraine, not intractable, without status migrainosus: Secondary | ICD-10-CM | POA: Diagnosis not present

## 2019-11-27 DIAGNOSIS — Z7982 Long term (current) use of aspirin: Secondary | ICD-10-CM

## 2019-11-27 DIAGNOSIS — Z23 Encounter for immunization: Secondary | ICD-10-CM | POA: Diagnosis not present

## 2019-11-27 DIAGNOSIS — E039 Hypothyroidism, unspecified: Secondary | ICD-10-CM | POA: Diagnosis not present

## 2019-11-27 DIAGNOSIS — M810 Age-related osteoporosis without current pathological fracture: Secondary | ICD-10-CM

## 2019-11-27 DIAGNOSIS — I10 Essential (primary) hypertension: Secondary | ICD-10-CM

## 2019-11-27 DIAGNOSIS — Z5181 Encounter for therapeutic drug level monitoring: Secondary | ICD-10-CM | POA: Insufficient documentation

## 2019-11-27 DIAGNOSIS — G4700 Insomnia, unspecified: Secondary | ICD-10-CM | POA: Diagnosis not present

## 2019-11-27 MED ORDER — TRAZODONE HCL 50 MG PO TABS: 25.0000 mg | ORAL_TABLET | Freq: Every day | ORAL | 3 refills | Status: DC

## 2019-11-27 NOTE — Assessment & Plan Note (Signed)
We have discussed ASA use at length today. Pending lipid panel to evaluate overall CVD risk. Advised reasonable to stay on ASA for now, with great thought to discontinue at 64 years old due to risks outweighing benefit. We will continue to discuss at future visits.

## 2019-11-27 NOTE — Assessment & Plan Note (Signed)
Ha's are unchanged from prior. Blood pressure well controlled. Continue imitrex 50mg  prn for now. Reiterated that if HA presentation, onset was ever different from past, that she needs to be evaluated. Counseled on symptoms of CVA.

## 2019-11-27 NOTE — Assessment & Plan Note (Signed)
Stable. Continue amlodipine 5mg .

## 2019-11-27 NOTE — Assessment & Plan Note (Signed)
Uncontrolled. Advised to stop ambien and trial trazodone 25mg .

## 2019-11-27 NOTE — Patient Instructions (Addendum)
Call me closer to 03/2020 so I can order your bone density.   Start trazodone 25mg  at night. May increase to 50mg  after a week or two. Call me if you would like to increase further. Please STOP Ambien while on trazodone.   As discussed, for now we will continue aspirin 81mg  with close attention to benefit versus risk.  Benefit being reduced risk of ischemic stroke, non fatal heart attack and colorectal cancer ( if used for 10 years). However these benefits are most apparent in adults aged 13 to 57 years with a ?10% 10-year cardiovasular risk . Persons who are not at increased risk for bleeding, have a life expectancy of at least 10 years, and are willing to take low-dose aspirin daily for at least 10 years are more likely to benefit. Persons who place a higher value on the potential benefits than the potential harms may choose to initiate low-dose aspirin.  At 70 years and above the risk of gastrointestinal bleeding, falls,  hemorrhagic stroke increases therefore becoming a very personal discussion in regards to whether you continue aspirin 81 mg.   For now , we have opted to continue however please bring to my attention at follow so we can continue to have this discussion.

## 2019-11-27 NOTE — Progress Notes (Signed)
Subjective:    Patient ID: Jennifer Clayton, female    DOB: 13-Dec-1955, 64 y.o.   MRN: [id number]  CC: Jennifer Clayton is a 64 y.o. female who presents today for follow up.   HPI: Feels well today No new complaints.   HTN- compliant with norvasc. No cp, sob  Weight gain- doing well on wellbutrin,   Insomnia- doesn't feel ambien as effective as it has been in sleep latency. No depression.   Migraines- 'have been really doing really good'  However did have a HA this week. Feels similar to prior and feels HA this was related to change in weather. Takes one quarter of imitrex with relief . HA is typically left sided. HA's feels similar to HA as in the past. Has aura with 'fuzzy' vision in left visual field which is 'not severe'.  No loss of vision, facial numbness.    Right breast cancer- following with Dr Bary Castilla, Dr Rogue Bussing and Dr Donella Stade. Compliant with letrozole. She is on prolia.   Not sure who told her to start 81mg  asa. She has been on for a couple of years. No h/o GIB. No family h/o colon cancer.     HISTORY:  Past Medical History:  Diagnosis Date  . Cancer ([place])    skin cancer (left leg)/BREAST CANCER  . H/O radioactive iodine thyroid ablation   . Headache    MIGRAINES  . Hypertension   . Hypothyroidism    Past Surgical History:  Procedure Laterality Date  . ABDOMINAL HYSTERECTOMY     Age 76, for bleeding and cyst; NO ovaries  . BREAST LUMPECTOMY WITH SENTINEL LYMPH NODE BIOPSY Right 05/13/2019   Procedure: BREAST LUMPECTOMY WITH SENTINEL LYMPH NODE BX;  Surgeon: Robert Bellow, MD;  Location: [facility] ORS;  Service: General;  Laterality: Right;  Marland Kitchen VAGINAL DELIVERY     Family History  Problem Relation Age of Onset  . Cancer Mother        lung, non-smoker  . Dementia Father        related to traumatic brain injury  . Breast cancer Neg Hx     Allergies: Zomepirac, Amoxicillin-pot clavulanate, and Other Current Outpatient Medications on File Prior to  Visit  Medication Sig Dispense Refill  . amLODipine (NORVASC) 5 MG tablet Take 1 tablet (5 mg total) by mouth daily. (Patient taking differently: Take 5 mg by mouth at bedtime. ) 90 tablet 3  . Ascorbic Acid (VITAMIN C PO) Take 1 tablet by mouth daily.    Marland Kitchen aspirin EC 81 MG tablet Take 81 mg by mouth every other day.    Marland Kitchen buPROPion (WELLBUTRIN XL) 300 MG 24 hr tablet TAKE 1 TABLET BY MOUTH DAILY 90 tablet 1  . calcium carbonate (OS-CAL - DOSED IN MG OF ELEMENTAL CALCIUM) 1250 (500 Ca) MG tablet Take 1 tablet by mouth daily.    Marland Kitchen letrozole (FEMARA) 2.5 MG tablet Take 1 tablet (2.5 mg total) by mouth daily. Once a day. 90 tablet 3  . levothyroxine (SYNTHROID) 100 MCG tablet TAKE 1 TABLET EVERY DAY ON EMPTY STOMACHWITH A GLASS OF WATER AT LEAST 30-60 MINBEFORE BREAKFAST (Patient taking differently: Take 100 mcg by mouth daily before breakfast. ) 90 tablet 0  . Multiple Vitamins-Minerals (MULTIVITAMIN ADULT PO) Take 1 tablet by mouth daily.    . SUMAtriptan (IMITREX) 50 MG tablet TAKE 1 TABLET ONCE MAY REPEAT IN 2 HOURSIF NECESSARY 15 tablet 2   No current facility-administered medications on file prior to visit.  Social History   Tobacco Use  . Smoking status: Never Smoker  . Smokeless tobacco: Never Used  Vaping Use  . Vaping Use: Never used  Substance Use Topics  . Alcohol use: Yes    Alcohol/week: 1.0 standard drink    Types: 1 Glasses of wine per week    Comment: rare  . Drug use: Never    Review of Systems  Constitutional: Negative for chills and fever.  Eyes: Negative for visual disturbance.  Respiratory: Negative for cough.   Cardiovascular: Negative for chest pain and palpitations.  Gastrointestinal: Negative for nausea and vomiting.  Neurological: Positive for headaches. Negative for dizziness and numbness.  Psychiatric/Behavioral: Positive for sleep disturbance. The patient is not nervous/anxious.       Objective:    BP 112/76   Pulse 73   Temp (!) 97.5 F (36.4  C)   Ht 5\' 3"  (1.6 m)   Wt 127 lb (57.6 kg)   SpO2 99%   BMI 22.50 kg/m  BP Readings from Last 3 Encounters:  11/27/19 112/76  10/15/19 120/68  09/26/19 123/72   Wt Readings from Last 3 Encounters:  11/27/19 127 lb (57.6 kg)  10/15/19 123 lb (55.8 kg)  09/26/19 127 lb 14.4 oz (58 kg)    Physical Exam Vitals reviewed.  Constitutional:      Appearance: She is well-developed.  Eyes:     Conjunctiva/sclera: Conjunctivae normal.  Cardiovascular:     Rate and Rhythm: Normal rate and regular rhythm.     Pulses: Normal pulses.     Heart sounds: Normal heart sounds.  Pulmonary:     Effort: Pulmonary effort is normal.     Breath sounds: Normal breath sounds. No wheezing, rhonchi or rales.  Skin:    General: Skin is warm and dry.  Neurological:     Mental Status: She is alert.  Psychiatric:        Speech: Speech normal.        Behavior: Behavior normal.        Thought Content: Thought content normal.        Assessment & Plan:   Problem List Items Addressed This Visit      Cardiovascular and Mediastinum   HTN (hypertension) - Primary    Stable. Continue amlodipine 5mg .       Relevant Orders   Comprehensive metabolic panel   Hemoglobin A1c   Lipid panel   Migraine    Ha's are unchanged from prior. Blood pressure well controlled. Continue imitrex 50mg  prn for now. Reiterated that if HA presentation, onset was ever different from past, that she needs to be evaluated. Counseled on symptoms of CVA.       Relevant Medications   traZODone (DESYREL) 50 MG tablet     Endocrine   Hypothyroidism   Relevant Orders   TSH     Musculoskeletal and Integument   Osteoporosis   Relevant Orders   VITAMIN D 25 Hydroxy (Vit-D Deficiency, Fractures)     Other   Encounter for monitoring of chronic aspirin therapy    We have discussed ASA use at length today. Pending lipid panel to evaluate overall CVD risk. Advised reasonable to stay on ASA for now, with great thought to  discontinue at 64 years old due to risks outweighing benefit. We will continue to discuss at future visits.        Insomnia    Uncontrolled. Advised to stop [place] and trial trazodone 25mg .  Relevant Medications   traZODone (DESYREL) 50 MG tablet    Other Visit Diagnoses    Need for immunization against influenza       Relevant Orders   Flu Vaccine QUAD High Dose(Fluad) (Completed)       I have discontinued [facility]Debbie"'s zolpidem. I am also having her start on traZODone. Additionally, I am having her maintain her Multiple Vitamins-Minerals (MULTIVITAMIN ADULT PO), levothyroxine, amLODipine, aspirin EC, Ascorbic Acid (VITAMIN C PO), letrozole, buPROPion, calcium carbonate, and SUMAtriptan.   Meds ordered this encounter  Medications  . traZODone (DESYREL) 50 MG tablet    Sig: Take 0.5-1 tablets (25-50 mg total) by mouth at bedtime.    Dispense:  90 tablet    Refill:  3    Order Specific Question:   Supervising Provider    Answer:   Crecencio Mc [[id number]]    Return precautions given.   Risks, benefits, and alternatives of the medications and treatment plan prescribed today were discussed, and patient expressed understanding.   Education regarding symptom management and diagnosis given to patient on AVS.  Continue to follow with Burnard Hawthorne, FNP for routine health maintenance.   Lyn Henri Cormany and I agreed with plan.   Mable Paris, FNP

## 2019-12-03 ENCOUNTER — Other Ambulatory Visit (INDEPENDENT_AMBULATORY_CARE_PROVIDER_SITE_OTHER): Payer: PPO

## 2019-12-03 ENCOUNTER — Other Ambulatory Visit: Payer: Self-pay

## 2019-12-03 DIAGNOSIS — E039 Hypothyroidism, unspecified: Secondary | ICD-10-CM

## 2019-12-03 DIAGNOSIS — M810 Age-related osteoporosis without current pathological fracture: Secondary | ICD-10-CM | POA: Diagnosis not present

## 2019-12-03 DIAGNOSIS — I10 Essential (primary) hypertension: Secondary | ICD-10-CM | POA: Diagnosis not present

## 2019-12-03 LAB — LIPID PANEL
Cholesterol: 176 mg/dL (ref 0–200)
HDL: 55.1 mg/dL (ref >39.00)
LDL Cholesterol: 107 mg/dL — ABNORMAL HIGH (ref 0–99)
NonHDL: 121.03
Total CHOL/HDL Ratio: 3
Triglycerides: 68 mg/dL (ref 0.0–149.0)
VLDL: 13.6 mg/dL (ref 0.0–40.0)

## 2019-12-03 LAB — COMPREHENSIVE METABOLIC PANEL
ALT: 10 U/L (ref 0–35)
AST: 13 U/L (ref 0–37)
Albumin: 3.9 g/dL (ref 3.5–5.2)
Alkaline Phosphatase: 55 U/L (ref 39–117)
BUN: 10 mg/dL (ref 6–23)
CO2: 30 mEq/L (ref 19–32)
Calcium: 8.8 mg/dL (ref 8.4–10.5)
Chloride: 99 mEq/L (ref 96–112)
Creatinine, Ser: 0.79 mg/dL (ref 0.40–1.20)
GFR: 77.78 mL/min (ref >60.00)
Glucose, Bld: 82 mg/dL (ref 70–99)
Potassium: 4.3 mEq/L (ref 3.5–5.1)
Sodium: 136 mEq/L (ref 135–145)
Total Bilirubin: 0.4 mg/dL (ref 0.2–1.2)
Total Protein: 6.3 g/dL (ref 6.0–8.3)

## 2019-12-03 LAB — TSH: TSH: 2.2 u[IU]/mL (ref 0.35–4.50)

## 2019-12-03 LAB — HEMOGLOBIN A1C: Hgb A1c MFr Bld: 5.9 % (ref 4.6–6.5)

## 2019-12-03 LAB — VITAMIN D 25 HYDROXY (VIT D DEFICIENCY, FRACTURES): VITD: 35.48 ng/mL (ref 30.00–100.00)

## 2019-12-04 ENCOUNTER — Encounter: Payer: Self-pay | Admitting: Family

## 2019-12-09 ENCOUNTER — Other Ambulatory Visit: Payer: Self-pay | Admitting: Family

## 2019-12-09 DIAGNOSIS — E785 Hyperlipidemia, unspecified: Secondary | ICD-10-CM

## 2019-12-09 MED ORDER — ROSUVASTATIN CALCIUM 5 MG PO TABS: 5.0000 mg | ORAL_TABLET | Freq: Every evening | ORAL | 3 refills | Status: DC

## 2020-01-13 ENCOUNTER — Encounter: Payer: Self-pay | Admitting: Family

## 2020-01-13 DIAGNOSIS — Z20822 Contact with and (suspected) exposure to covid-19: Secondary | ICD-10-CM | POA: Diagnosis not present

## 2020-01-15 ENCOUNTER — Telehealth: Payer: Self-pay | Admitting: Oncology

## 2020-01-15 ENCOUNTER — Other Ambulatory Visit: Payer: Self-pay | Admitting: Oncology

## 2020-01-15 ENCOUNTER — Encounter: Payer: Self-pay | Admitting: Oncology

## 2020-01-15 DIAGNOSIS — U071 COVID-19: Secondary | ICD-10-CM

## 2020-01-15 NOTE — Telephone Encounter (Signed)
I connected by phone with  Jennifer Clayton to discuss the potential use of an new treatment for mild to moderate COVID-19 viral infection in non-hospitalized patients.   This patient is a age/sex that meets the FDA criteria for Emergency Use Authorization of casirivimab\imdevimab.  Has a (+) direct SARS-CoV-2 viral test result 1. Has mild or moderate COVID-19  2. Is ? 66 years of age and weighs ? 40 kg 3. Is NOT hospitalized due to COVID-19 4. Is NOT requiring oxygen therapy or requiring an increase in baseline oxygen flow rate due to COVID-19 5. Is within 10 days of symptom onset 6. Has at least one of the high risk factor(s) for progression to severe COVID-19 and/or hospitalization as defined in EUA. Specific high risk criteria : Past Medical History:  Diagnosis Date  . Cancer ([place])    skin cancer (left leg)/BREAST CANCER  . H/O radioactive iodine thyroid ablation   . Headache    MIGRAINES  . Hypertension   . Hypothyroidism   ?  ?    Symptom onset 01/11/20   I have spoken and communicated the following to the patient or parent/caregiver:   1. FDA has authorized the emergency use of casirivimab\imdevimab for the treatment of mild to moderate COVID-19 in adults and pediatric patients with positive results of direct SARS-CoV-2 viral testing who are 67 years of age and older weighing at least 40 kg, and who are at high risk for progressing to severe COVID-19 and/or hospitalization.   2. The significant known and potential risks and benefits of casirivimab\imdevimab, and the extent to which such potential risks and benefits are unknown.   3. Information on available alternative treatments and the risks and benefits of those alternatives, including clinical trials.   4. Patients treated with casirivimab\imdevimab should continue to self-isolate and use infection control measures (e.g., wear mask, isolate, social distance, avoid sharing personal items, clean and disinfect "high touch"  surfaces, and frequent handwashing) according to CDC guidelines.    5. The patient or parent/caregiver has the option to accept or refuse casirivimab\imdevimab .   After reviewing this information with the patient, The patient agreed to proceed with receiving casirivimab\imdevimab infusion and will be provided a copy of the Fact sheet prior to receiving the infusion.Rulon Abide, AGNP-C [phone] ([place])

## 2020-01-16 ENCOUNTER — Ambulatory Visit (HOSPITAL_COMMUNITY)
Admission: RE | Admit: 2020-01-16 | Discharge: 2020-01-16 | Disposition: A | Discharge status: Home / Self Care | Payer: Medicare Other | Source: Ambulatory Visit | Attending: Pulmonary Disease | Admitting: Pulmonary Disease

## 2020-01-16 DIAGNOSIS — Z23 Encounter for immunization: Secondary | ICD-10-CM | POA: Diagnosis not present

## 2020-01-16 DIAGNOSIS — U071 COVID-19: Secondary | ICD-10-CM | POA: Insufficient documentation

## 2020-01-16 MED ORDER — EPINEPHRINE 0.3 MG/0.3ML IJ SOAJ: 0.3000 mg | Freq: Once | INTRAMUSCULAR | Status: DC | PRN

## 2020-01-16 MED ORDER — METHYLPREDNISOLONE SODIUM SUCC 125 MG IJ SOLR: 125.0000 mg | Freq: Once | INTRAMUSCULAR | Status: DC | PRN

## 2020-01-16 MED ORDER — ALBUTEROL SULFATE HFA 108 (90 BASE) MCG/ACT IN AERS: 2.0000 | INHALATION_SPRAY | Freq: Once | RESPIRATORY_TRACT | Status: DC | PRN

## 2020-01-16 MED ORDER — FAMOTIDINE IN NACL 20-0.9 MG/50ML-% IV SOLN: 20.0000 mg | Freq: Once | INTRAVENOUS | Status: DC | PRN

## 2020-01-16 MED ORDER — SODIUM CHLORIDE 0.9 % IV SOLN: Freq: Once | INTRAVENOUS | Status: AC

## 2020-01-16 MED ORDER — SODIUM CHLORIDE 0.9 % IV SOLN: INTRAVENOUS | Status: DC | PRN

## 2020-01-16 MED ORDER — DIPHENHYDRAMINE HCL 50 MG/ML IJ SOLN: 50.0000 mg | Freq: Once | INTRAMUSCULAR | Status: DC | PRN

## 2020-01-16 NOTE — Discharge Instructions (Signed)
10 Things You Can Do to Manage Your COVID-19 Symptoms at Home If you have possible or confirmed COVID-19: 1. Stay home from work and school. And stay away from other public places. If you must go out, avoid using any kind of public transportation, ridesharing, or taxis. 2. Monitor your symptoms carefully. If your symptoms get worse, call your healthcare provider immediately. 3. Get rest and stay hydrated. 4. If you have a medical appointment, call the healthcare provider ahead of time and tell them that you have or may have COVID-19. 5. For medical emergencies, call 911 and notify the dispatch personnel that you have or may have COVID-19. 6. Cover your cough and sneezes with a tissue or use the inside of your elbow. 7. Wash your hands often with soap and water for at least 20 seconds or clean your hands with an alcohol-based hand sanitizer that contains at least 60% alcohol. 8. As much as possible, stay in a specific room and away from other people in your home. Also, you should use a separate bathroom, if available. If you need to be around other people in or outside of the home, wear a mask. 9. Avoid sharing personal items with other people in your household, like dishes, towels, and bedding. 10. Clean all surfaces that are touched often, like counters, tabletops, and doorknobs. Use household cleaning sprays or wipes according to the label instructions. cdc.gov/coronavirus 08/01/2018 This information is not intended to replace advice given to you by your health care provider. Make sure you discuss any questions you have with your health care provider. Document Revised: 01/03/2019 Document Reviewed: 01/03/2019 Elsevier Patient Education  2020 Elsevier Inc. What types of side effects do monoclonal antibody drugs cause?  Common side effects  In general, the more common side effects caused by monoclonal antibody drugs include: . Allergic reactions, such as hives or itching . Flu-like signs and  symptoms, including chills, fatigue, fever, and muscle aches and pains . Nausea, vomiting . Diarrhea . Skin rashes . Low blood pressure   The CDC is recommending patients who receive monoclonal antibody treatments wait at least 90 days before being vaccinated.  Currently, there are no data on the safety and efficacy of mRNA COVID-19 vaccines in persons who received monoclonal antibodies or convalescent plasma as part of COVID-19 treatment. Based on the estimated half-life of such therapies as well as evidence suggesting that reinfection is uncommon in the 90 days after initial infection, vaccination should be deferred for at least 90 days, as a precautionary measure until additional information becomes available, to avoid interference of the antibody treatment with vaccine-induced immune responses. If you have any questions or concerns after the infusion please call the Advanced Practice Provider on call at [phone]. This number is ONLY intended for your use regarding questions or concerns about the infusion post-treatment side-effects.  Please do not provide this number to others for use. For return to work notes please contact your primary care provider.   If someone you know is interested in receiving treatment please have them call the COVID hotline at [phone].   

## 2020-01-16 NOTE — Progress Notes (Signed)
Patient reviewed Fact Sheet for Patients, Parents, and Caregivers for Emergency Use Authorization (EUA) of bamlanivimab and etesevimab for the Treatment of Coronavirus. Patient also reviewed and is agreeable to the estimated cost of treatment. Patient is agreeable to proceed.   

## 2020-01-16 NOTE — Progress Notes (Signed)
°  Diagnosis: COVID-19  Physician:  Asencion Noble  Procedure: [facility] Med: bamlanivimab\etesevimab infusion - Provided patient with bamlanimivab\etesevimab fact sheet for patients, parents and caregivers prior to infusion.  Complications: No immediate complications noted.  Discharge: Discharged home   Dorene Sorrow 01/16/2020

## 2020-01-17 ENCOUNTER — Encounter: Payer: Self-pay | Admitting: Family

## 2020-01-17 ENCOUNTER — Telehealth (INDEPENDENT_AMBULATORY_CARE_PROVIDER_SITE_OTHER): Payer: Medicare Other | Admitting: Family

## 2020-01-17 DIAGNOSIS — U071 COVID-19: Secondary | ICD-10-CM | POA: Diagnosis not present

## 2020-01-17 NOTE — Assessment & Plan Note (Addendum)
Fever resolved. No SOB. She has had MAI. Advised to start vit C, quercetin. She will let me know if she doesn't continue to improve.

## 2020-01-17 NOTE — Progress Notes (Signed)
Verbal consent for services obtained from patient prior to services given to TELEPHONE visit:   Location of call:  provider at work patient at home  Names of all persons present for services: Mable Paris, NP and patient Chief complaint:  CC: positive covid. Cough started 7 days ago. Endorses nasal congestion.  Body aches are improving. Fever has resolved.   Husband was exhibiting symptoms 13 days ago. No sob, cp, leg swelling.  Compliant with zinc, vitamin d.   Had monoclonal antibody yesterday and 'feels at turning point.'  covid vaccinated  Vitals:   01/17/20 1208  BP: 138/80      A/P/next steps:  Problem List Items Addressed This Visit      Other   COVID-19    Fever resolved. No SOB. She has had MAI. Advised to start vit C, quercetin. She will let me know if she doesn't continue to improve.           I spent 10 min  discussing plan of care over the phone.

## 2020-01-17 NOTE — Patient Instructions (Signed)
Covid testing in [facility] I recommend anyone that has had close contact with you to quarantine immediately and be tested with PCR covid test for most accurate test.    [email or web address]  We have called [facility] in [place] regarding monoclonal antibodies infusion as we discussed;please expect a phone call from them in the next day or  Two, if not please let me know asap as you have narrow window to get this infusion. You have to receive with 10 days of symptom onset.     Purchase pulse oximeter to monitor oxygenation saturation. If you oxygen were to drop < 90%, this is an emergency and you may need oxygen support. You would need to seek in person evaluation at the emergency room or call 911.   Please start Mucinex DM to take at bedtime for cough.     Please begin Vit C 1000 mg daily   Vitamin D3 4000 IU daily   Zinc 50 mg daily   Quercetin 250 mg -500 mg twice daily ; this an antioxidant which reduces inflammation. You can find it at places such as [place] and [employer] however not limited to these stores.   Once you are better, you may STOP all the above supplements.   Rest, hydrate very well, eat healthy protein food, Tylenol or Advil as directed .    Please go to Acute Care if symptoms worsen but are not an emergency. Office video visit again early next week.    Remain in self quarantine until at least 10 days since symptom onset AND 3 consecutive days fever free without antipyretics AND improvement in respiratory symptoms.    Utilize over the counter medications to treat symptoms.   Seek  treatment in the ED if respiratory issues/distress develops or unrelieved chest pain. Or, if you become severely weak, dehydrated, confused.    Only leave home to seek medical care and must wear a mask in public. Limit contact with family members or caregivers in the home and to notify her family who she was with  yesterday that they should quarantine for 14 days. Practice social distancing and to continue to use good preventative care measures such has frequent hand washing.      Your COVID-19 test was resulted as positive.    You should continue your self imposed quantarine until you can answer "yes" to ALL 3 of the conditions below:   1) Your symptoms (fever, cough, shortness of breath) started 7 or more days ago   2) Your body temperature  has been normal for at least 72 hours (WITHOUT the use of any tylenol, motrin or aleve) . Normal is < 100.4 Farenheit  3) your other flu  like symptoms symptoms are getting better.   YOUR FAMILY or other household contacts, however , even if they feel fine , will need to continue to quarantining themselves  (that means no contact with ANYONE outside of the house)  for 14 days STARTING from the end of your initial 7 day period . (why? because they have been theoretically exposed to you during the entire 7 days of your illness, and they can sheD the virus without symptoms for that period of time ).         10 Things You Can Do to Manage Your COVID-19 Symptoms at Home If you have possible or confirmed COVID-19: 1. Stay home from work and school. And stay away from other public places. If you must go out, avoid using any  kind of public transportation, ridesharing, or taxis. 2. Monitor your symptoms carefully. If your symptoms get worse, call your healthcare provider immediately. 3. Get rest and stay hydrated. 4. If you have a medical appointment, call the healthcare provider ahead of time and tell them that you have or may have COVID-19. 5. For medical emergencies, call 911 and notify the dispatch personnel that you have or may have COVID-19. 6. Cover your cough and sneezes with a tissue or use the inside of your elbow. 7. Wash your hands often with soap and water for at least 20 seconds or clean your hands with an alcohol-based hand sanitizer that contains at least  60% alcohol. 8. As much as possible, stay in a specific room and away from other people in your home. Also, you should use a separate bathroom, if available. If you need to be around other people in or outside of the home, wear a mask. 9. Avoid sharing personal items with other people in your household, like dishes, towels, and bedding. 10. Clean all surfaces that are touched often, like counters, tabletops, and doorknobs. Use household cleaning sprays or wipes according to the label instructions. [email or web address] 08/01/2018

## 2020-02-12 ENCOUNTER — Other Ambulatory Visit: Payer: Self-pay | Admitting: Family

## 2020-02-13 ENCOUNTER — Other Ambulatory Visit: Payer: Self-pay | Admitting: Family

## 2020-02-13 DIAGNOSIS — R635 Abnormal weight gain: Secondary | ICD-10-CM

## 2020-02-24 ENCOUNTER — Telehealth: Payer: Self-pay | Admitting: Radiation Oncology

## 2020-02-24 NOTE — Telephone Encounter (Signed)
Pt. Left VM--would like a call back to r/s her appt on 1/18 with Dr. Baruch Gouty.

## 2020-02-28 ENCOUNTER — Ambulatory Visit: Payer: PPO | Admitting: Radiation Oncology

## 2020-03-13 ENCOUNTER — Ambulatory Visit (INDEPENDENT_AMBULATORY_CARE_PROVIDER_SITE_OTHER): Payer: PPO

## 2020-03-13 VITALS — Ht 63.0 in | Wt 127.0 lb

## 2020-03-13 DIAGNOSIS — Z0000 Encounter for general adult medical examination without abnormal findings: Secondary | ICD-10-CM

## 2020-03-13 NOTE — Patient Instructions (Addendum)
Ms. Notch , Thank you for taking time to come for your Medicare Wellness Visit. I appreciate your ongoing commitment to your health goals. Please review the following plan we discussed and let me know if I can assist you in the future.   These are the goals we discussed: Goals      Patient Stated   .  I would like to lose about 5lb (pt-stated)       This is a list of the screening recommended for you and due dates:  Health Maintenance  Topic Date Due  . Pneumonia vaccines (1 of 2 - PCV13) Never done  . COVID-19 Vaccine (3 - Pfizer risk 4-dose series) 05/01/2020*  . Colon Cancer Screening  01/13/2021  . Mammogram  04/08/2021  . Tetanus Vaccine  06/24/2027  . Flu Shot  Completed  . DEXA scan (bone density measurement)  Completed  .  Hepatitis C: One time screening is recommended by Center for Disease Control  (CDC) for  adults born from [birth year] through 1967.   Completed  *Topic was postponed. The date shown is not the original due date.    Immunizations Immunization History  Administered Date(s) Administered  . Fluad Quad(high Dose 65+) 11/27/2019  . Influenza,inj,Quad PF,6+ Mos 10/21/2015  . Influenza-Unspecified 12/02/2018  . PFIZER(Purple Top)SARS-COV-2 Vaccination 04/01/2019, 04/22/2019  . Tdap 06/23/2017  . Zoster Recombinat (Shingrix) 12/20/2017   Keep all routine maintenance appointments.   Next scheduled lab 05/13/20 @ 8:15.  Follow up 05/27/20 @ 11:00.  Advanced directives: End of life planning; Advance aging; Advanced directives discussed.  Copy of current HCPOA/Living Will requested.    Conditions/risks identified: none new  Follow up in one year for your annual wellness visit   Preventive Care 65 Years and Older, Female Preventive care refers to lifestyle choices and visits with your health care provider that can promote health and wellness. What does preventive care include?  A yearly physical exam. This is also called an annual well check.  Dental exams  once or twice a year.  Routine eye exams. Ask your health care provider how often you should have your eyes checked.  Personal lifestyle choices, including:  Daily care of your teeth and gums.  Regular physical activity.  Eating a healthy diet.  Avoiding tobacco and drug use.  Limiting alcohol use.  Practicing safe sex.  Taking low-dose aspirin every day.  Taking vitamin and mineral supplements as recommended by your health care provider. What happens during an annual well check? The services and screenings done by your health care provider during your annual well check will depend on your age, overall health, lifestyle risk factors, and family history of disease. Counseling  Your health care provider may ask you questions about your:  Alcohol use.  Tobacco use.  Drug use.  Emotional well-being.  Home and relationship well-being.  Sexual activity.  Eating habits.  History of falls.  Memory and ability to understand (cognition).  Work and work Statistician.  Reproductive health. Screening  You may have the following tests or measurements:  Height, weight, and BMI.  Blood pressure.  Lipid and cholesterol levels. These may be checked every 5 years, or more frequently if you are over 75 years old.  Skin check.  Lung cancer screening. You may have this screening every year starting at age 65 if you have a 30-pack-year history of smoking and currently smoke or have quit within the past 15 years.  Fecal occult blood test (FOBT) of the stool. You  may have this test every year starting at age 41.  Flexible sigmoidoscopy or colonoscopy. You may have a sigmoidoscopy every 5 years or a colonoscopy every 10 years starting at age 20.  Hepatitis C blood test.  Hepatitis B blood test.  Sexually transmitted disease (STD) testing.  Diabetes screening. This is done by checking your blood sugar (glucose) after you have not eaten for a while (fasting). You may have  this done every 1-3 years.  Bone density scan. This is done to screen for osteoporosis. You may have this done starting at age 25.  Mammogram. This may be done every 1-2 years. Talk to your health care provider about how often you should have regular mammograms. Talk with your health care provider about your test results, treatment options, and if necessary, the need for more tests. Vaccines  Your health care provider may recommend certain vaccines, such as:  Influenza vaccine. This is recommended every year.  Tetanus, diphtheria, and acellular pertussis (Tdap, Td) vaccine. You may need a Td booster every 10 years.  Zoster vaccine. You may need this after age 75.  Pneumococcal 13-valent conjugate (PCV13) vaccine. One dose is recommended after age 64.  Pneumococcal polysaccharide (PPSV23) vaccine. One dose is recommended after age 46. Talk to your health care provider about which screenings and vaccines you need and how often you need them. This information is not intended to replace advice given to you by your health care provider. Make sure you discuss any questions you have with your health care provider. Document Released: 02/13/2015 Document Revised: 10/07/2015 Document Reviewed: 11/18/2014 Elsevier Interactive Patient Education  2017 [place] Prevention in the Home Falls can cause injuries. They can happen to people of all ages. There are many things you can do to make your home safe and to help prevent falls. What can I do on the outside of my home?  Regularly fix the edges of walkways and driveways and fix any cracks.  Remove anything that might make you trip as you walk through a door, such as a raised step or threshold.  Trim any bushes or trees on the path to your home.  Use bright outdoor lighting.  Clear any walking paths of anything that might make someone trip, such as rocks or tools.  Regularly check to see if handrails are loose or broken. Make sure  that both sides of any steps have handrails.  Any raised decks and porches should have guardrails on the edges.  Have any leaves, snow, or ice cleared regularly.  Use sand or salt on walking paths during winter.  Clean up any spills in your garage right away. This includes oil or grease spills. What can I do in the bathroom?  Use night lights.  Install grab bars by the toilet and in the tub and shower. Do not use towel bars as grab bars.  Use non-skid mats or decals in the tub or shower.  If you need to sit down in the shower, use a plastic, non-slip stool.  Keep the floor dry. Clean up any water that spills on the floor as soon as it happens.  Remove soap buildup in the tub or shower regularly.  Attach bath mats securely with double-sided non-slip rug tape.  Do not have throw rugs and other things on the floor that can make you trip. What can I do in the bedroom?  Use night lights.  Make sure that you have a light by your bed that is  easy to reach.  Do not use any sheets or blankets that are too big for your bed. They should not hang down onto the floor.  Have a firm chair that has side arms. You can use this for support while you get dressed.  Do not have throw rugs and other things on the floor that can make you trip. What can I do in the kitchen?  Clean up any spills right away.  Avoid walking on wet floors.  Keep items that you use a lot in easy-to-reach places.  If you need to reach something above you, use a strong step stool that has a grab bar.  Keep electrical cords out of the way.  Do not use floor polish or wax that makes floors slippery. If you must use wax, use non-skid floor wax.  Do not have throw rugs and other things on the floor that can make you trip. What can I do with my stairs?  Do not leave any items on the stairs.  Make sure that there are handrails on both sides of the stairs and use them. Fix handrails that are broken or loose. Make  sure that handrails are as long as the stairways.  Check any carpeting to make sure that it is firmly attached to the stairs. Fix any carpet that is loose or worn.  Avoid having throw rugs at the top or bottom of the stairs. If you do have throw rugs, attach them to the floor with carpet tape.  Make sure that you have a light switch at the top of the stairs and the bottom of the stairs. If you do not have them, ask someone to add them for you. What else can I do to help prevent falls?  Wear shoes that:  Do not have high heels.  Have rubber bottoms.  Are comfortable and fit you well.  Are closed at the toe. Do not wear sandals.  If you use a stepladder:  Make sure that it is fully opened. Do not climb a closed stepladder.  Make sure that both sides of the stepladder are locked into place.  Ask someone to hold it for you, if possible.  Clearly mark and make sure that you can see:  Any grab bars or handrails.  First and last steps.  Where the edge of each step is.  Use tools that help you move around (mobility aids) if they are needed. These include:  Canes.  Walkers.  Scooters.  Crutches.  Turn on the lights when you go into a dark area. Replace any light bulbs as soon as they burn out.  Set up your furniture so you have a clear path. Avoid moving your furniture around.  If any of your floors are uneven, fix them.  If there are any pets around you, be aware of where they are.  Review your medicines with your doctor. Some medicines can make you feel dizzy. This can increase your chance of falling. Ask your doctor what other things that you can do to help prevent falls. This information is not intended to replace advice given to you by your health care provider. Make sure you discuss any questions you have with your health care provider. Document Released: 11/13/2008 Document Revised: 06/25/2015 Document Reviewed: 02/21/2014 Elsevier Interactive Patient Education   2017 [employer].

## 2020-03-13 NOTE — Progress Notes (Addendum)
Subjective:   Jennifer Clayton is a 65 y.o. female who presents for Medicare Annual (Subsequent) preventive examination.  Review of Systems    No ROS.  Medicare Wellness Virtual Visit.    Cardiac Risk Factors include: advanced age (>33men, >35 women);hypertension     Objective:    Today's Vitals   03/13/20 1234  Weight: 127 lb (57.6 kg)  Height: 5\' 3"  (1.6 m)   Body mass index is 22.5 kg/m.  Advanced Directives 03/13/2020 10/15/2019 08/13/2019 06/11/2019 05/28/2019 05/21/2019 05/07/2019  Does Patient Have a Medical Advance Directive? Yes No Yes Yes Yes Yes Yes  Type of Paramedic of [place];Living will - [place];Living will Living will - Living will -  Does patient want to make changes to medical advance directive? No - Patient declined - No - Patient declined No - Patient declined - No - Patient declined -  Copy of [place] in Chart? No - copy requested - No - copy requested - - - -  Would patient like information on creating a medical advance directive? - No - Patient declined - - - - -    Current Medications (verified) Outpatient Encounter Medications as of 03/13/2020  Medication Sig  . zolpidem (AMBIEN) 5 MG tablet TAKE 1 TABLET BY MOUTH AT BEDTIME  . amLODipine (NORVASC) 5 MG tablet Take 1 tablet (5 mg total) by mouth daily. (Patient taking differently: Take 5 mg by mouth at bedtime.)  . Ascorbic Acid (VITAMIN C PO) Take 1 tablet by mouth daily.  Marland Kitchen aspirin EC 81 MG tablet Take 81 mg by mouth every other day.  Marland Kitchen buPROPion (WELLBUTRIN XL) 300 MG 24 hr tablet TAKE 1 TABLET BY MOUTH DAILY  . calcium carbonate (OS-CAL - DOSED IN MG OF ELEMENTAL CALCIUM) 1250 (500 Ca) MG tablet Take 1 tablet by mouth daily.  Marland Kitchen letrozole (FEMARA) 2.5 MG tablet Take 1 tablet (2.5 mg total) by mouth daily. Once a day.  . levothyroxine (SYNTHROID) 100 MCG tablet TAKE 1 TABLET EVERY DAY ON EMPTY STOMACHWITH A GLASS OF WATER AT LEAST 30-60  MINBEFORE BREAKFAST (Patient taking differently: Take 100 mcg by mouth daily before breakfast.)  . Multiple Vitamins-Minerals (MULTIVITAMIN ADULT PO) Take 1 tablet by mouth daily.  . rosuvastatin (CRESTOR) 5 MG tablet Take 1 tablet (5 mg total) by mouth every evening.  . SUMAtriptan (IMITREX) 50 MG tablet TAKE 1 TABLET ONCE MAY REPEAT IN 2 HOURSIF NECESSARY  . traZODone (DESYREL) 50 MG tablet Take 0.5-1 tablets (25-50 mg total) by mouth at bedtime.   No facility-administered encounter medications on file as of 03/13/2020.    Allergies (verified) Zomepirac, Amoxicillin-pot clavulanate, and Other   History: Past Medical History:  Diagnosis Date  . Cancer ([place])    skin cancer (left leg)/BREAST CANCER  . H/O radioactive iodine thyroid ablation   . Headache    MIGRAINES  . Hypertension   . Hypothyroidism    Past Surgical History:  Procedure Laterality Date  . ABDOMINAL HYSTERECTOMY     Age 48, for bleeding and cyst; NO ovaries  . BREAST LUMPECTOMY WITH SENTINEL LYMPH NODE BIOPSY Right 05/13/2019   Procedure: BREAST LUMPECTOMY WITH SENTINEL LYMPH NODE BX;  Surgeon: Robert Bellow, MD;  Location: [facility] ORS;  Service: General;  Laterality: Right;  Marland Kitchen VAGINAL DELIVERY     Family History  Problem Relation Age of Onset  . Cancer Mother        lung, non-smoker  . Dementia  Father        related to traumatic brain injury  . Breast cancer Neg Hx    Social History   Socioeconomic History  . Marital status: Married    Spouse name: Not on file  . Number of children: Not on file  . Years of education: Not on file  . Highest education level: Not on file  Occupational History  . Not on file  Tobacco Use  . Smoking status: Never Smoker  . Smokeless tobacco: Never Used  Vaping Use  . Vaping Use: Never used  Substance and Sexual Activity  . Alcohol use: Yes    Alcohol/week: 1.0 standard drink    Types: 1 Glasses of wine per week    Comment: rare  . Drug use: Never  . Sexual  activity: Not on file  Other Topics Concern  . Not on file  Social History Narrative   Lives in [place] with husband and son and 4 grandchildren.      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       Never smoked; social alcohol.       Social Determinants of Health   Financial Resource Strain: Not on file  Food Insecurity: No Food Insecurity  . Worried About Charity fundraiser in the Last Year: Never true  . Ran Out of Food in the Last Year: Never true  Transportation Needs: No Transportation Needs  . Lack of Transportation (Medical): No  . Lack of Transportation (Non-Medical): No  Physical Activity: Sufficiently Active  . Days of Exercise per Week: 3 days  . Minutes of Exercise per Session: 60 min  Stress: No Stress Concern Present  . Feeling of Stress : Not at all  Social Connections: Unknown  . Frequency of Communication with Friends and Family: More than three times a week  . Frequency of Social Gatherings with Friends and Family: More than three times a week  . Attends Religious Services: Not on file  . Active Member of Clubs or Organizations: Not on file  . Attends Archivist Meetings: Not on file  . Marital Status: Not on file    Tobacco Counseling Counseling given: Not Answered   Clinical Intake:  Pre-visit preparation completed: Yes        Diabetes: No  How often do you need to have someone help you when you read instructions, pamphlets, or other written materials from your doctor or pharmacy?: 1 - Never   Interpreter Needed?: No      Activities of Daily Living In your present state of health, do you have any difficulty performing the following activities: 03/13/2020 05/07/2019  Hearing? N N  Vision? N N  Difficulty concentrating or making decisions? N N  Walking or climbing stairs? N N  Dressing or bathing? N N  Doing  errands, shopping? N N  Preparing Food and eating ? N -  Using the Toilet? N -  In the past six months, have you accidently leaked urine? N -  Do you have problems with loss of bowel control? N -  Managing your Medications? N -  Managing your Finances? N -  Housekeeping or managing your Housekeeping? N -  Some recent data might be hidden    Patient Care Team: Burnard Hawthorne, FNP as PCP - General (Family Medicine) [place],  Alyssa Grove, RN as Oncology Nurse Navigator Noreene Filbert, MD as Radiation Oncologist (Radiation Oncology) Bary Castilla Forest Gleason, MD as Consulting Physician (General Surgery) Cammie Sickle, MD as Consulting Physician (Internal Medicine)  Indicate any recent Medical Services you may have received from other than [facility] providers in the past year (date may be approximate).     Assessment:   This is a routine wellness examination for Darl.  I connected with Sondra today by telephone and verified that I am speaking with the correct person using two identifiers. Location patient: home Location provider: work Persons participating in the virtual visit: patient, Marine scientist.    I discussed the limitations, risks, security and privacy concerns of performing an evaluation and management service by telephone and the availability of in person appointments. The patient expressed understanding and verbally consented to this telephonic visit.    Interactive audio and video telecommunications were attempted between this provider and patient, however failed, due to patient having technical difficulties OR patient did not have access to video capability.  We continued and completed visit with audio only.  Some vital signs may be absent or patient reported.   Hearing/Vision screen  Hearing Screening   125Hz  250Hz  500Hz  1000Hz  2000Hz  3000Hz  4000Hz  6000Hz  8000Hz   Right ear:           Left ear:           Comments: Patient is able to hear conversational tones without difficulty.  No  issues reported.  Vision Screening Comments: Visual acuity not assessed, virtual visit.  They have seen their ophthalmologist in the last 12 months.     Dietary issues and exercise activities discussed: Current Exercise Habits: Home exercise routine, Type of exercise: walking;calisthenics, Intensity: Moderate  Healthy diet Good water intake  Goals      Patient Stated   .  I would like to lose about 5lb (pt-stated)      Depression Screen PHQ 2/9 Scores 03/13/2020 11/27/2019 03/05/2019 03/10/2016  PHQ - 2 Score 0 0 0 0  PHQ- 9 Score - 2 - -    Fall Risk Fall Risk  03/13/2020 03/05/2019 08/29/2018 03/10/2016  Falls in the past year? 0 0 0 No  Comment - - Emmi Telephone Survey: data to providers prior to load -  Number falls in past yr: 0 - - -  Injury with Fall? 0 - - -  Follow up Falls evaluation completed Falls evaluation completed - -    FALL RISK PREVENTION PERTAINING TO THE HOME: Handrails in use when climbing stairs? Yes Home free of loose throw rugs in walkways, pet beds, electrical cords, etc? Yes  Adequate lighting in your home to reduce risk of falls? Yes   ASSISTIVE DEVICES UTILIZED TO PREVENT FALLS: Use of a cane, walker or w/c? No   TIMED UP AND GO: Was the test performed? No . Virtual visit.   Cognitive Function:  Patient is alert and oriented x3.  Denies difficulty focusing, making decisions, memory loss.  Enjoys home schooling her grandchild, reading and other brain stimulating activities.  MMSE/6CIT deferred. Normal by direct communication/observation.      Immunizations Immunization History  Administered Date(s) Administered  . Fluad Quad(high Dose 65+) 11/27/2019  . Influenza,inj,Quad PF,6+ Mos 10/21/2015  . Influenza-Unspecified 12/02/2018  . PFIZER(Purple Top)SARS-COV-2 Vaccination 04/01/2019, 04/22/2019  . Tdap 06/23/2017  . Zoster Recombinat (Shingrix) 12/20/2017   Health Maintenance Health Maintenance  Topic Date Due  . PNA vac Low Risk Adult  (1 of 2 - PCV13)  Never done  . COVID-19 Vaccine (3 - Pfizer risk 4-dose series) 05/01/2020 (Originally 05/20/2019)  . COLONOSCOPY (Pts 45-26yrs Insurance coverage will need to be confirmed)  01/13/2021  . MAMMOGRAM  04/08/2021  . TETANUS/TDAP  06/24/2027  . INFLUENZA VACCINE  Completed  . DEXA SCAN  Completed  . Hepatitis C Screening  Completed   Colorectal cancer screening: Type of screening: Colonoscopy. Completed 01/14/16. Repeat every 5 years  Mammogram status: Completed 04/09/19. Repeat every year  Bone Density status: Completed 04/09/19. Results reflect: Bone density results: OSTEOPOROSIS. Repeat every 2 years. Prolia injection every 6 months.   PNA vaccine- plans to complete at local pharmacy or office visit.   Lung Cancer Screening: (Low Dose CT Chest recommended if Age 82-80 years, 30 pack-year currently smoking OR have quit w/in 15years.) does not qualify.   Hepatitis C Screening: Completed 11/12/15.   Vision Screening: Recommended annual ophthalmology exams for early detection of glaucoma and other disorders of the eye. Is the patient up to date with their annual eye exam?  Yes   Dental Screening: Recommended annual dental exams for proper oral hygiene. [place].   Community Resource Referral / Chronic Care Management: CRR required this visit?  No   CCM required this visit?  No      Plan:   Keep all routine maintenance appointments.   Next scheduled lab 05/13/20 @ 8:15.  Follow up 05/27/20 @ 11:00.  I have personally reviewed and noted the following in the patient's chart:   . Medical and social history . Use of alcohol, tobacco or illicit drugs  . Current medications and supplements . Functional ability and status . Nutritional status . Physical activity . Advanced directives . List of other physicians . Hospitalizations, surgeries, and ER visits in previous 12 months . Vitals . Screenings to include cognitive, depression, and  falls . Referrals and appointments  In addition, I have reviewed and discussed with patient certain preventive protocols, quality metrics, and best practice recommendations. A written personalized care plan for preventive services as well as general preventive health recommendations were provided to patient via mychart.      Varney Biles, LPN   7/37/1062     Agree with plan. Mable Paris, NP

## 2020-03-24 ENCOUNTER — Other Ambulatory Visit: Payer: Self-pay | Admitting: Family

## 2020-03-31 ENCOUNTER — Encounter: Payer: Self-pay | Admitting: Dermatology

## 2020-03-31 ENCOUNTER — Ambulatory Visit: Payer: PPO | Admitting: Dermatology

## 2020-03-31 ENCOUNTER — Other Ambulatory Visit: Payer: Self-pay

## 2020-03-31 DIAGNOSIS — Z85828 Personal history of other malignant neoplasm of skin: Secondary | ICD-10-CM | POA: Diagnosis not present

## 2020-03-31 DIAGNOSIS — L578 Other skin changes due to chronic exposure to nonionizing radiation: Secondary | ICD-10-CM

## 2020-03-31 DIAGNOSIS — D1800 Hemangioma unspecified site: Secondary | ICD-10-CM

## 2020-03-31 DIAGNOSIS — Z1283 Encounter for screening for malignant neoplasm of skin: Secondary | ICD-10-CM | POA: Diagnosis not present

## 2020-03-31 DIAGNOSIS — D485 Neoplasm of uncertain behavior of skin: Secondary | ICD-10-CM

## 2020-03-31 DIAGNOSIS — D229 Melanocytic nevi, unspecified: Secondary | ICD-10-CM | POA: Diagnosis not present

## 2020-03-31 DIAGNOSIS — D492 Neoplasm of unspecified behavior of bone, soft tissue, and skin: Secondary | ICD-10-CM

## 2020-03-31 DIAGNOSIS — L570 Actinic keratosis: Secondary | ICD-10-CM | POA: Diagnosis not present

## 2020-03-31 DIAGNOSIS — L821 Other seborrheic keratosis: Secondary | ICD-10-CM

## 2020-03-31 DIAGNOSIS — L649 Androgenic alopecia, unspecified: Secondary | ICD-10-CM

## 2020-03-31 DIAGNOSIS — L814 Other melanin hyperpigmentation: Secondary | ICD-10-CM | POA: Diagnosis not present

## 2020-03-31 MED ORDER — MUPIROCIN 2 % EX OINT: TOPICAL_OINTMENT | CUTANEOUS | 1 refills | Status: DC

## 2020-03-31 NOTE — Patient Instructions (Addendum)
Wound Care Instructions  On the day following your surgery, you should begin doing daily dressing changes: 1. Remove the old dressing and discard it. 2. Cleanse the wound gently with tap water. This may be done in the shower or by placing a wet gauze pad directly on the wound and letting it soak for several minutes. 3. It is important to gently remove any dried blood from the wound in order to encourage healing. This may be done by gently rolling a moistened Q-tip on the dried blood. Do not pick at the wound. 4. If the wound should start to bleed, continue cleaning the wound, then place a moist gauze pad on the wound and hold pressure for a few minutes.  5. Make sure you then dry the skin surrounding the wound completely or the tape will not stick to the skin. Do not use cotton balls on the wound. 6. After the wound is clean and dry, apply the ointment gently with a Q-tip. 7. Cut a non-stick pad to fit the size of the wound. Lay the pad flush to the wound. If the wound is draining, you may want to reinforce it with a small amount of gauze on top of the non-stick pad for a little added compression to the area. 8. Use the tape to seal the area completely. 9. Select from the following with respect to your individual situation: 1. If your wound has been stitched closed: continue the above steps 1-8 at least daily until your sutures are removed. 2. If your wound has been left open to heal: continue steps 1-8 at least daily for the first 3-4 weeks. 10. We would like for you to take a few extra precautions for at least the next week. 1. Sleep with your head elevated on pillows if our wound is on your head. 2. Do not bend over or lift heavy items to reduce the chance of elevated blood pressure to the wound 3. Do not participate in particularly strenuous activities.   Below is a list of dressing supplies you might need.  . Cotton-tipped applicators - Q-tips . Gauze pads (2x2 and/or 4x4) - All-Purpose  Sponges . Non-stick dressing material - Telfa . Tape - Paper or Hypafix . New and clean tube of petroleum jelly - Vaseline    Comments on Post-Operative Period 1. Slight swelling and redness often appear around the wound. This is normal and will disappear within several days following the surgery. 2. The healing wound will drain a brownish-red-yellow discharge during healing. This is a normal phase of wound healing. As the wound begins to heal, the drainage may increase in amount. Again, this drainage is normal. 3. Notify us if the drainage becomes persistently bloody, excessively swollen, or intensely painful or develops a foul odor or red streaks.  4. If you should experience mild discomfort during the healing phase, you may take an aspirin-free medication such as Tylenol (acetaminophen). Notify us if the discomfort is severe or persistent. Avoid alcoholic beverages when taking pain medicine.  In Case of Wound Hemorrhage A wound hemorrhage is when the bandage suddenly becomes soaked with bright red blood and flows profusely. If this happens, sit down or lie down with your head elevated. If the wound has a dressing on it, do not remove the dressing. Apply pressure to the existing gauze. If the wound is not covered, use a gauze pad to apply pressure and continue applying the pressure for 20 minutes without peeking. DO NOT COVER THE WOUND WITH   A LARGE TOWEL OR [place] CLOTH. Release your hand from the wound site but do not remove the dressing. If the bleeding has stopped, gently clean around the wound. Leave the dressing in place for 24 hours if possible. This wait time allows the blood vessels to close off so that you do not spark a new round of bleeding by disrupting the newly clotted blood vessels with an immediate dressing change. If the bleeding does not subside, continue to hold pressure. If matters are out of your control, contact an After Hours clinic or go to the Emergency Room.   Recommend  taking Heliocare sun protection supplement daily in sunny weather for additional sun protection. For maximum protection on the sunniest days, you can take up to 2 capsules of regular Heliocare OR take 1 capsule of Heliocare Ultra. For prolonged exposure (such as a full day in the sun), you can repeat your dose of the supplement 4 hours after your first dose. Heliocare can be purchased at [facility] or at [email or web address].

## 2020-03-31 NOTE — Progress Notes (Signed)
Follow-Up Visit   Subjective  Jennifer Clayton is a 65 y.o. female who presents for the following: Annual Exam (Mole check, total body, Hx of SCC R lower leg), Skin Problem (Pt c/o irritated growth on the center of the chest ), and Hair/Scalp Problem (Hair thinning x 3 years, hx of thyroid problems-controlled by prescription medications ).  The patient presents for Total-Body Skin Exam (TBSE) for skin cancer screening and mole check.  The following portions of the chart were reviewed this encounter and updated as appropriate:   Tobacco  Allergies  Meds  Problems  Med Hx  Surg Hx  Fam Hx      Review of Systems:  No other skin or systemic complaints except as noted in HPI or Assessment and Plan.  Objective  Well appearing patient in no apparent distress; mood and affect are within normal limits.  A full examination was performed including scalp, head, eyes, ears, nose, lips, neck, chest, axillae, abdomen, back, buttocks, bilateral upper extremities, bilateral lower extremities, hands, feet, fingers, toes, fingernails, and toenails. All findings within normal limits unless otherwise noted below.  Objective  Right forearm: 0.8 cm brown and pink thin papule   Objective  mid upper chest x 1, L lateral calf x 1, R nasal dorsum x 1, R calf x 1 (4): Erythematous thin papules/macules with gritty scale.   Objective  scalp: Diffuse thinning of the crown and widening of the midline part with retention of the frontal hairline Negative hair pull test   Assessment & Plan  Neoplasm of skin Right forearm  Epidermal / dermal shaving  Lesion diameter (cm):  0.8 Informed consent: discussed and consent obtained   Patient was prepped and draped in usual sterile fashion: area prepped with alcohol. Anesthesia: the lesion was anesthetized in a standard fashion   Anesthetic:  1% lidocaine w/ epinephrine 1-100,000 buffered w/ 8.4% NaHCO3 Instrument used: flexible razor blade   Hemostasis  achieved with: pressure, aluminum chloride and electrodesiccation   Outcome: patient tolerated procedure well   Post-procedure details: wound care instructions given   Post-procedure details comment:  Ointment and small bandage applied  Specimen 1 - Surgical pathology Differential Diagnosis: R/O Atypia   Check Margins: No 0.8 cm brown and pink thin papule  R/O Atypia   Ordered Medications: mupirocin ointment (BACTROBAN) 2 %  AK (actinic keratosis) (4) mid upper chest x 1, L lateral calf x 1, R nasal dorsum x 1, R calf x 1  Hypertropic Ak at mid upper chest   Mid upper chest Recheck at follow up, if not gone consider biopsy   Prior to procedure, discussed risks of blister formation, small wound, skin dyspigmentation, or rare scar following cryotherapy.    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 Condition is bothersome to patient. Currently flared.  Reviewed progressive nature and prognosis.   Most recent Labs reviewed CBC, TSH, Vitamin D - unremarkable  Treatment options discussed including topical  minoxidil, low dose prescription PO finasteride, and low dose prescription PO minoxidil. PO options deferred today.  Recommend minoxidil 5% (Rogaine for men) solution or foam to be applied to the scalp and left in. This should ideally be used twice daily for best results but it helps with hair regrowth when used at least three times per week.  Rogaine initially can cause increased hair shedding for the first few weeks but this will stop with continued use. In studies, people who used minoxidil (Rogaine) for at least 6 months had thicker hair than people who did not. Minoxidil topical (Rogaine) only works as long as it continues to be used. If if it is no longer used then the hair it has been helping to regrow can fall out. Minoxidil topical (Rogaine) can cause increased facial hair growth which can usually be managed easily with a battery-operated hair trimmer. If facial hair growth is bothersome, switching to the women's version can decrease the risk of unwanted facial hair growth.   Lentigines - Scattered tan macules - Due to sun exposure - Benign-appering, observe - Recommend daily broad spectrum sunscreen SPF 30+ to sun-exposed areas, reapply every 2 hours as needed. - Call for any changes  Seborrheic Keratoses - Stuck-on, waxy, tan-brown papules and plaques  - Discussed benign etiology and prognosis. - Observe - Call for any changes  Melanocytic Nevi - Tan-brown and/or pink-flesh-colored symmetric macules and papules - Benign appearing on exam today - Observation - Call clinic for new or changing moles - Recommend daily use of broad spectrum spf 30+ sunscreen to sun-exposed areas.   Hemangiomas - Red papules - Discussed benign nature - Observe - Call for any changes  Actinic Damage - Chronic, secondary to cumulative UV/sun exposure - diffuse scaly erythematous macules with underlying dyspigmentation - Recommend daily broad spectrum sunscreen SPF 30+ to sun-exposed areas, reapply every 2 hours as needed.  - Call for new or changing lesions.  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  - Call if any  new or changing lesions are noted between office visits  Skin cancer screening performed today.    Return in about 2 months (around 05/31/2020) for Aks .  I, Marye Round, CMA, am acting as scribe for Forest Gleason, MD .  Documentation: I have reviewed the above documentation for accuracy and completeness, and I agree with the above.  Forest Gleason, MD

## 2020-04-07 ENCOUNTER — Telehealth: Payer: Self-pay

## 2020-04-07 NOTE — Telephone Encounter (Signed)
-----   Message from [place], MD sent at 04/02/2020  4:01 PM EST ----- Skin , right forearm [place]  "precancer" and sun spot --> recommend LN2 in clinic (or could treat with at home cream 5-fluorouracil/calcipotriene twice a day for one week if preferred)  MAs please call. Thank you!

## 2020-04-07 NOTE — Telephone Encounter (Signed)
Patient advised bx results showed AK. Patient prefers to treat at home with 5FU/calcipotriene BID x 7 days, JS

## 2020-04-13 ENCOUNTER — Inpatient Hospital Stay: Payer: PPO | Attending: Internal Medicine

## 2020-04-13 ENCOUNTER — Encounter: Payer: Self-pay | Admitting: Internal Medicine

## 2020-04-13 ENCOUNTER — Ambulatory Visit
Admission: RE | Admit: 2020-04-13 | Discharge: 2020-04-13 | Disposition: A | Discharge status: Home / Self Care | Payer: PPO | Source: Ambulatory Visit | Attending: Radiation Oncology | Admitting: Radiation Oncology

## 2020-04-13 ENCOUNTER — Other Ambulatory Visit: Payer: Self-pay

## 2020-04-13 ENCOUNTER — Inpatient Hospital Stay: Payer: PPO

## 2020-04-13 ENCOUNTER — Inpatient Hospital Stay (HOSPITAL_BASED_OUTPATIENT_CLINIC_OR_DEPARTMENT_OTHER): Payer: PPO | Admitting: Internal Medicine

## 2020-04-13 DIAGNOSIS — C50411 Malignant neoplasm of upper-outer quadrant of right female breast: Secondary | ICD-10-CM

## 2020-04-13 DIAGNOSIS — Z170 Estrogen receptor positive status [ER+]: Secondary | ICD-10-CM | POA: Insufficient documentation

## 2020-04-13 DIAGNOSIS — R232 Flushing: Secondary | ICD-10-CM | POA: Diagnosis not present

## 2020-04-13 DIAGNOSIS — Z923 Personal history of irradiation: Secondary | ICD-10-CM | POA: Insufficient documentation

## 2020-04-13 DIAGNOSIS — Z79899 Other long term (current) drug therapy: Secondary | ICD-10-CM | POA: Insufficient documentation

## 2020-04-13 DIAGNOSIS — Z79811 Long term (current) use of aromatase inhibitors: Secondary | ICD-10-CM | POA: Insufficient documentation

## 2020-04-13 DIAGNOSIS — I10 Essential (primary) hypertension: Secondary | ICD-10-CM | POA: Diagnosis not present

## 2020-04-13 DIAGNOSIS — Z08 Encounter for follow-up examination after completed treatment for malignant neoplasm: Secondary | ICD-10-CM | POA: Diagnosis not present

## 2020-04-13 DIAGNOSIS — M2550 Pain in unspecified joint: Secondary | ICD-10-CM | POA: Insufficient documentation

## 2020-04-13 DIAGNOSIS — E039 Hypothyroidism, unspecified: Secondary | ICD-10-CM | POA: Insufficient documentation

## 2020-04-13 DIAGNOSIS — M810 Age-related osteoporosis without current pathological fracture: Secondary | ICD-10-CM | POA: Diagnosis not present

## 2020-04-13 DIAGNOSIS — Z8616 Personal history of COVID-19: Secondary | ICD-10-CM | POA: Diagnosis not present

## 2020-04-13 LAB — BASIC METABOLIC PANEL
Anion gap: 9 (ref 5–15)
BUN: 14 mg/dL (ref 8–23)
CO2: 28 mmol/L (ref 22–32)
Calcium: 9.1 mg/dL (ref 8.9–10.3)
Chloride: 98 mmol/L (ref 98–111)
Creatinine, Ser: 0.81 mg/dL (ref 0.44–1.00)
GFR, Estimated: >60 mL/min (ref >60)
Glucose, Bld: 92 mg/dL (ref 70–99)
Potassium: 4.5 mmol/L (ref 3.5–5.1)
Sodium: 135 mmol/L (ref 135–145)

## 2020-04-13 MED ORDER — DENOSUMAB 60 MG/ML ~~LOC~~ SOSY
60.0000 mg | PREFILLED_SYRINGE | Freq: Once | SUBCUTANEOUS | Status: AC
Start: 2020-04-13 — End: 2020-04-13
  Administered 2020-04-13: 60 mg via SUBCUTANEOUS
  Filled 2020-04-13: qty 1

## 2020-04-13 NOTE — Assessment & Plan Note (Addendum)
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  On adjuvant Letrozole.  Tolerating well no major side effects noted except for mild joint pains/hot flashes.  # joint pains- STABLE- G-1 sec to letrozole.   # Hot flashes-STABLE; G-1 sec to letrozole.   # OSTEOPOROSIS [BMD-2021]-T score -3; continue calcium plus vitamin D plus exercise program.  Desnosumab. Discussed the potential benefits and/side effects  Including but not limited to Osteonecrosis of jaw/ hypocalcemia.  Today calcium 8.8; incarese Vit D 1000/day.  Proceed with Prolia today.  # DISPOSITION: # Prolia today # Follow up in 6 months- MD; labs- BMP;prolia SQ-  Dr.B

## 2020-04-13 NOTE — Progress Notes (Signed)
[facility] [place] NOTE  Patient Care Team: Burnard Hawthorne, FNP as PCP - General (Family Medicine) Theodore Demark, RN as Oncology Nurse Navigator Noreene Filbert, MD as Radiation Oncologist (Radiation Oncology) Bary Castilla Forest Gleason, MD as Consulting Physician (General Surgery) Cammie Sickle, MD as Consulting Physician (Internal Medicine)  CHIEF COMPLAINTS/PURPOSE OF CONSULTATION: Breast cancer  #  Oncology History Overview Note  # MARCH 2021-right breast 11 o'clock position-s/p lumpectomy; SLNBx [Dr. Tollie Pizza ]pT1c (15 mm) p N0-grade-1; ER/PR positive HER-2 negative; Oncotype low risk [risk of recurrence is 5%]-no chemotherapy; s/p RT [08/12/2019]- Oncotyoe- 5% risk of recurrence. No chemo.   #Mid July 2021-letrozole  #Osteoporosis 2021-Prolia  # SURVIVORSHIP:   # GENETICS:   DIAGNOSIS: Right breast cancer  STAGE:   1      ;  GOALS: Cure  CURRENT/MOST RECENT THERAPY : Letrozole    Carcinoma of upper-outer quadrant of right breast in female, estrogen receptor positive ([place])  05/22/2019 Initial Diagnosis   Carcinoma of upper-outer quadrant of right breast in female, estrogen receptor positive ([place])      HISTORY OF PRESENTING ILLNESS:  Jennifer Clayton 65 y.o.  female stage I breast cancer ER/PR positive Her-2 negative on letrozole is here for follow-up.  Patient had to postpone her visit with Dr. Bary Castilla given recent Covid infection.  She recovered without any major hospitalization.   Patient denies any new shortness of breath or cough.  Mild hot flashes.  Mild joint pains not any worse.   Review of Systems  Constitutional: Negative for chills, diaphoresis, fever, malaise/fatigue and weight loss.  HENT: Negative for nosebleeds and sore throat.   Eyes: Negative for double vision.  Respiratory: Negative for cough, hemoptysis, sputum production, shortness of breath and wheezing.   Cardiovascular: Negative for chest pain, palpitations, orthopnea and  leg swelling.  Gastrointestinal: Negative for abdominal pain, blood in stool, constipation, diarrhea, heartburn, melena, nausea and vomiting.  Genitourinary: Negative for dysuria, frequency and urgency.  Musculoskeletal: Positive for joint pain. Negative for back pain.  Skin: Negative.  Negative for itching and rash.  Neurological: Negative for dizziness, tingling, focal weakness, weakness and headaches.  Endo/Heme/Allergies: Does not bruise/bleed easily.  Psychiatric/Behavioral: Negative for depression. The patient is not nervous/anxious and does not have insomnia.      MEDICAL HISTORY:  Past Medical History:  Diagnosis Date  . Cancer ([place])    skin cancer (left leg)/BREAST CANCER  . H/O radioactive iodine thyroid ablation   . Headache    MIGRAINES  . Hypertension   . Hypothyroidism     SURGICAL HISTORY: Past Surgical History:  Procedure Laterality Date  . ABDOMINAL HYSTERECTOMY     Age 73, for bleeding and cyst; NO ovaries  . BREAST LUMPECTOMY WITH SENTINEL LYMPH NODE BIOPSY Right 05/13/2019   Procedure: BREAST LUMPECTOMY WITH SENTINEL LYMPH NODE BX;  Surgeon: Robert Bellow, MD;  Location: [facility] ORS;  Service: General;  Laterality: Right;  Marland Kitchen VAGINAL DELIVERY      SOCIAL HISTORY: Social History   Socioeconomic History  . Marital status: Married    Spouse name: Not on file  . Number of children: Not on file  . Years of education: Not on file  . Highest education level: Not on file  Occupational History  . Not on file  Tobacco Use  . Smoking status: Never Smoker  . Smokeless tobacco: Never Used  Vaping Use  . Vaping Use: Never used  Substance and Sexual Activity  . Alcohol use: Yes  Alcohol/week: 1.0 standard drink    Types: 1 Glasses of wine per week    Comment: rare  . Drug use: Never  . Sexual activity: Not on file  Other Topics Concern  . Not on file  Social History Narrative   Lives in [place] with husband and son and 4 grandchildren.      Work -  Full time in [employer] - regular diet      Exercise - typically daily at [facility] for 45-96mn, lifting some weight, ellipitical, walks.       Never smoked; social alcohol.       Social Determinants of Health   Financial Resource Strain: Not on file  Food Insecurity: No Food Insecurity  . Worried About RCharity fundraiserin the Last Year: Never true  . Ran Out of Food in the Last Year: Never true  Transportation Needs: No Transportation Needs  . Lack of Transportation (Medical): No  . Lack of Transportation (Non-Medical): No  Physical Activity: Sufficiently Active  . Days of Exercise per Week: 3 days  . Minutes of Exercise per Session: 60 min  Stress: No Stress Concern Present  . Feeling of Stress : Not at all  Social Connections: Unknown  . Frequency of Communication with Friends and Family: More than three times a week  . Frequency of Social Gatherings with Friends and Family: More than three times a week  . Attends Religious Services: Not on file  . Active Member of Clubs or Organizations: Not on file  . Attends CArchivistMeetings: Not on file  . Marital Status: Not on file  Intimate Partner Violence: Not At Risk  . Fear of Current or Ex-Partner: No  . Emotionally Abused: No  . Physically Abused: No  . Sexually Abused: No    FAMILY HISTORY: Family History  Problem Relation Age of Onset  . Cancer Mother        lung, non-smoker  . Dementia Father        related to traumatic brain injury  . Breast cancer Neg Hx     ALLERGIES:  is allergic to zomepirac, amoxicillin-pot clavulanate, and other.  MEDICATIONS:  Current Outpatient Medications  Medication Sig Dispense Refill  . amLODipine (NORVASC) 5 MG tablet TAKE 1 TABLET BY MOUTH ONCE DAILY 90 tablet 3  . Ascorbic Acid (VITAMIN C PO) Take 1 tablet by mouth daily.    .Marland KitchenbuPROPion (WELLBUTRIN XL) 300 MG 24 hr tablet TAKE 1 TABLET BY MOUTH DAILY 90 tablet 1  . calcium carbonate (OS-CAL - DOSED IN MG  OF ELEMENTAL CALCIUM) 1250 (500 Ca) MG tablet Take 1 tablet by mouth daily.    .Marland Kitchenletrozole (FEMARA) 2.5 MG tablet Take 1 tablet (2.5 mg total) by mouth daily. Once a day. 90 tablet 3  . levothyroxine (SYNTHROID) 100 MCG tablet TAKE 1 TABLET EVERY DAY ON EMPTY STOMACHWITH A GLASS OF WATER AT LEAST 30-60 MINBEFORE BREAKFAST (Patient taking differently: Take 100 mcg by mouth daily before breakfast.) 90 tablet 0  . Multiple Vitamins-Minerals (MULTIVITAMIN ADULT PO) Take 1 tablet by mouth daily.    . mupirocin ointment (BACTROBAN) 2 % Apply to skin qd-bid 22 g 1  . rosuvastatin (CRESTOR) 5 MG tablet Take 1 tablet (5 mg total) by mouth every evening. 90 tablet 3  . SUMAtriptan (IMITREX) 50 MG tablet TAKE 1 TABLET ONCE MAY REPEAT IN 2 HOURSIF NECESSARY 15 tablet 2  . zolpidem (AMBIEN) 5 MG  tablet TAKE 1 TABLET BY MOUTH AT BEDTIME 30 tablet 1   No current facility-administered medications for this visit.      Marland Kitchen  PHYSICAL EXAMINATION: ECOG PERFORMANCE STATUS: 0 - Asymptomatic  Vitals:   04/13/20 1027  BP: 134/75  Pulse: 61  Resp: 16  Temp: (!) 96.9 F (36.1 C)  SpO2: 100%   Filed Weights   04/13/20 1027  Weight: 126 lb 12.8 oz (57.5 kg)    Physical Exam HENT:     Head: Normocephalic and atraumatic.     Mouth/Throat:     Pharynx: No oropharyngeal exudate.  Eyes:     Pupils: Pupils are equal, round, and reactive to light.  Cardiovascular:     Rate and Rhythm: Normal rate and regular rhythm.  Pulmonary:     Effort: Pulmonary effort is normal. No respiratory distress.     Breath sounds: Normal breath sounds. No wheezing.  Abdominal:     General: Bowel sounds are normal. There is no distension.     Palpations: Abdomen is soft. There is no mass.     Tenderness: There is no abdominal tenderness. There is no guarding or rebound.  Musculoskeletal:        General: No tenderness. Normal range of motion.     Cervical back: Normal range of motion and neck supple.  Skin:    General: Skin  is warm.  Neurological:     Mental Status: She is alert and oriented to person, place, and time.  Psychiatric:        Mood and Affect: Affect normal.      LABORATORY DATA:  I have reviewed the data as listed Lab Results  Component Value Date   WBC 4.7 10/15/2019   HGB 13.3 10/15/2019   HCT 39.5 10/15/2019   MCV 93.4 10/15/2019   PLT 239 10/15/2019   Recent Labs    10/15/19 0953 12/03/19 0928 04/13/20 1012  NA 136 136 135  K 4.5 4.3 4.5  CL 100 99 98  CO2 _0 GLUCOSE 128* 82 92  BUN _1 CREATININE 0.77 0.79 0.81  CALCIUM 8.8* 8.8 9.1  GFRNONAA >60  --  >60  GFRAA >60  --   --   PROT  --  6.3  --   ALBUMIN  --  3.9  --   AST  --  13  --   ALT  --  10  --   ALKPHOS  --  55  --   BILITOT  --  0.4  --     RADIOGRAPHIC STUDIES: I have personally reviewed the radiological images as listed and agreed with the findings in the report. No results found.  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  On adjuvant Letrozole.  Tolerating well no major side effects noted except for mild joint pains/hot flashes.  # joint pains- STABLE- G-1 sec to letrozole.   # Hot flashes-STABLE; G-1 sec to letrozole.   # OSTEOPOROSIS [BMD-2021]-T score -3; continue calcium plus vitamin D plus exercise program.  Desnosumab. Discussed the potential benefits and/side effects  Including but not limited to Osteonecrosis of jaw/ hypocalcemia.  Today calcium 8.8; incarese Vit D 1000/day.  Proceed with Prolia today.  # DISPOSITION: # Prolia today # Follow up in 6 months- MD; labs- BMP;prolia SQ-  Dr.B  All questions  were answered. The patient/family knows to call the clinic with any problems, questions or concerns.    Cammie Sickle, MD 04/13/2020 11:02 AM

## 2020-04-13 NOTE — Progress Notes (Signed)
Radiation Oncology Follow up Note  Name: Jennifer Clayton   Date:   04/13/2020 MRN:  [id number] DOB: October 12, 1955    This 65 y.o. female presents to the clinic today for 24-month follow-up status post whole breast radiation to her right breast for stage I ER/PR positive invasive mammary carcinoma.  REFERRING PROVIDER: Burnard Hawthorne, FNP  HPI: Patient is a 65 year old female now out 6 months having completed whole breast radiation to her right breast for stage I ER/PR positive invasive mammary carcinoma.  Seen today in routine follow-up she is doing well.  She specifically denies breast tenderness cough or bone pain.  She is currently on letrozole tolerating it well without side effect.  She has not yet had a follow-up mammogram..  COMPLICATIONS OF TREATMENT: none  FOLLOW UP COMPLIANCE: keeps appointments   PHYSICAL EXAM:  There were no vitals taken for this visit. Lungs are clear to A&P cardiac examination essentially unremarkable with regular rate and rhythm. No dominant mass or nodularity is noted in either breast in 2 positions examined. Incision is well-healed. No axillary or supraclavicular adenopathy is appreciated. Cosmetic result is excellent.  Well-developed well-nourished patient in NAD. HEENT reveals PERLA, EOMI, discs not visualized.  Oral cavity is clear. No oral mucosal lesions are identified. Neck is clear without evidence of cervical or supraclavicular adenopathy. Lungs are clear to A&P. Cardiac examination is essentially unremarkable with regular rate and rhythm without murmur rub or thrill. Abdomen is benign with no organomegaly or masses noted. Motor sensory and DTR levels are equal and symmetric in the upper and lower extremities. Cranial nerves II through XII are grossly intact. Proprioception is intact. No peripheral adenopathy or edema is identified. No motor or sensory levels are noted. Crude visual fields are within normal range.  RADIOLOGY RESULTS: No current films to  review  PLAN: Present time patient is doing well 6 months out from whole breast radiation and pleased with her overall progress.  I have asked to see her back in 6 months for follow-up and then will start once year follow-ups.  She continues on letrozole without side effect.  Dr. Tollie Pizza will be scheduling her follow-up mammograms.  Patient knows to call with any concerns.  I would like to take this opportunity to thank you for allowing me to participate in the care of your patient.Noreene Filbert, MD

## 2020-04-14 ENCOUNTER — Other Ambulatory Visit: Payer: Self-pay | Admitting: General Surgery

## 2020-04-14 DIAGNOSIS — C50411 Malignant neoplasm of upper-outer quadrant of right female breast: Secondary | ICD-10-CM

## 2020-04-27 ENCOUNTER — Other Ambulatory Visit: Payer: Self-pay

## 2020-04-27 ENCOUNTER — Ambulatory Visit
Admission: RE | Admit: 2020-04-27 | Discharge: 2020-04-27 | Disposition: A | Discharge status: Home / Self Care | Payer: PPO | Source: Ambulatory Visit | Attending: General Surgery | Admitting: General Surgery

## 2020-04-27 DIAGNOSIS — C50411 Malignant neoplasm of upper-outer quadrant of right female breast: Secondary | ICD-10-CM | POA: Insufficient documentation

## 2020-04-27 DIAGNOSIS — Z853 Personal history of malignant neoplasm of breast: Secondary | ICD-10-CM | POA: Diagnosis not present

## 2020-04-27 DIAGNOSIS — R922 Inconclusive mammogram: Secondary | ICD-10-CM | POA: Diagnosis not present

## 2020-04-27 HISTORY — DX: Personal history of irradiation: Z92.3

## 2020-04-30 DIAGNOSIS — Z853 Personal history of malignant neoplasm of breast: Secondary | ICD-10-CM | POA: Diagnosis not present

## 2020-05-13 ENCOUNTER — Other Ambulatory Visit: Payer: Self-pay

## 2020-05-13 ENCOUNTER — Other Ambulatory Visit (INDEPENDENT_AMBULATORY_CARE_PROVIDER_SITE_OTHER): Payer: PPO

## 2020-05-13 DIAGNOSIS — E785 Hyperlipidemia, unspecified: Secondary | ICD-10-CM

## 2020-05-13 LAB — COMPREHENSIVE METABOLIC PANEL
ALT: 12 U/L (ref 0–35)
AST: 19 U/L (ref 0–37)
Albumin: 3.9 g/dL (ref 3.5–5.2)
Alkaline Phosphatase: 53 U/L (ref 39–117)
BUN: 7 mg/dL (ref 6–23)
CO2: 27 mEq/L (ref 19–32)
Calcium: 8.5 mg/dL (ref 8.4–10.5)
Chloride: 102 mEq/L (ref 96–112)
Creatinine, Ser: 0.79 mg/dL (ref 0.40–1.20)
GFR: 77.54 mL/min (ref >60.00)
Glucose, Bld: 89 mg/dL (ref 70–99)
Potassium: 4.9 mEq/L (ref 3.5–5.1)
Sodium: 135 mEq/L (ref 135–145)
Total Bilirubin: 0.5 mg/dL (ref 0.2–1.2)
Total Protein: 6.7 g/dL (ref 6.0–8.3)

## 2020-05-20 ENCOUNTER — Other Ambulatory Visit: Payer: Self-pay | Admitting: Family

## 2020-05-20 NOTE — Telephone Encounter (Signed)
RX Refill:ambien Last Seen:01-16-21 Last ordered:02-12-20

## 2020-05-22 ENCOUNTER — Other Ambulatory Visit: Payer: Self-pay | Admitting: Internal Medicine

## 2020-05-27 ENCOUNTER — Other Ambulatory Visit: Payer: Self-pay

## 2020-05-27 ENCOUNTER — Ambulatory Visit (INDEPENDENT_AMBULATORY_CARE_PROVIDER_SITE_OTHER): Payer: PPO | Admitting: Family

## 2020-05-27 ENCOUNTER — Encounter: Payer: Self-pay | Admitting: Family

## 2020-05-27 VITALS — BP 122/76 | HR 71 | Temp 97.6°F | Ht 63.0 in | Wt 126.2 lb

## 2020-05-27 DIAGNOSIS — Z23 Encounter for immunization: Secondary | ICD-10-CM

## 2020-05-27 DIAGNOSIS — R635 Abnormal weight gain: Secondary | ICD-10-CM | POA: Diagnosis not present

## 2020-05-27 DIAGNOSIS — G43809 Other migraine, not intractable, without status migrainosus: Secondary | ICD-10-CM | POA: Diagnosis not present

## 2020-05-27 DIAGNOSIS — I10 Essential (primary) hypertension: Secondary | ICD-10-CM | POA: Diagnosis not present

## 2020-05-27 NOTE — Patient Instructions (Signed)
Thank you for your understanding today.  Take care Jennifer Clayton and let me know if you need anything at all.

## 2020-05-27 NOTE — Progress Notes (Signed)
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 I advised telephone encounter out of an abundance of caution.    Names of all persons present for services: Mable Paris, NP and patient Follow up Feels well today No complaints   Following with Dr Bary Castilla for right breast cancer s/p right lumpectomy. Compliant with letrozole.  Osteoporosis- compliant with prolia as started by dr Rogue Bussing  Weight gain- compliant with wellbutrin 300mg  and feels medication remains helpful.   Insomnia- Lorrin Mais is working well.   htn- compliant with norvasc 5mg . No cp.   Migraine- right sided HA continue to present similar to  prior migraines. Continue to occur at end of the month.  Uses 'one quarter' of imitrex with resolution.  NO vision loss, numbness to the face.     BP Readings from Last 3 Encounters:  05/27/20 122/76  04/13/20 134/75  01/17/20 138/80   Wt Readings from Last 3 Encounters:  05/27/20 126 lb 3.2 oz (57.2 kg)  04/13/20 126 lb 12.8 oz (57.5 kg)  03/13/20 127 lb (57.6 kg)      A/P/next steps:  Problem List Items Addressed This Visit      Cardiovascular and Mediastinum   HTN (hypertension)    Well controlled. Continue amlodipine 5mg       Migraine    HA remain similar to prior. excellent control of blood pressure. No vision loss or alarm symptoms at this time. Continue imitrex half to quarter tablet of 50mg  prn. Patient will remain hypervigilant.         Other   Weight gain    Controlled. Continue wellbutrin 300mg        Other Visit Diagnoses    Need for pneumococcal vaccination    -  Primary   Relevant Orders   Pneumococcal conjugate vaccine 20-valent (Prevnar 20) (Completed)       I spent 15 min  discussing plan of care over the phone.

## 2020-05-29 NOTE — Assessment & Plan Note (Signed)
HA remain similar to prior. excellent control of blood pressure. No vision loss or alarm symptoms at this time. Continue imitrex half to quarter tablet of 50mg  prn. Patient will remain hypervigilant.

## 2020-05-29 NOTE — Assessment & Plan Note (Signed)
Well controlled. Continue amlodipine 5mg 

## 2020-05-29 NOTE — Assessment & Plan Note (Signed)
Controlled. Continue wellbutrin 300mg.  

## 2020-06-03 ENCOUNTER — Other Ambulatory Visit: Payer: Self-pay

## 2020-06-03 ENCOUNTER — Ambulatory Visit: Payer: PPO | Admitting: Dermatology

## 2020-06-03 DIAGNOSIS — M79601 Pain in right arm: Secondary | ICD-10-CM

## 2020-06-03 DIAGNOSIS — Z872 Personal history of diseases of the skin and subcutaneous tissue: Secondary | ICD-10-CM | POA: Diagnosis not present

## 2020-06-03 DIAGNOSIS — L570 Actinic keratosis: Secondary | ICD-10-CM | POA: Diagnosis not present

## 2020-06-03 NOTE — Progress Notes (Signed)
   Follow-Up Visit   Subjective  Jennifer Clayton Jennifer Clayton is a 65 y.o. female who presents for the following: 2 month follow up (Patient here today for follow up on biopsy on right forearm that was proven on actinic keratosis. She is also here today for follow up on aks on mid upper chest, left lateral calf, right nasal dorsum and right calf. Today she reports some scaly spots on top of bilateral ears. ).  The following portions of the chart were reviewed this encounter and updated as appropriate:  Tobacco  Allergies  Meds  Problems  Med Hx  Surg Hx  Fam Hx      Objective  Well appearing patient in no apparent distress; mood and affect are within normal limits.  A focused examination was performed including bilateral ears, face, mid upper chest, left calf . Relevant physical exam findings are noted in the Assessment and Plan.  Objective  bilateral ears: Erythematous thin papules/macules with gritty scale.   Assessment & Plan  Actinic keratosis bilateral ears  Actinic keratoses are precancerous spots that appear secondary to cumulative UV radiation exposure/sun exposure over time. They are chronic with expected duration over 1 year. A portion of actinic keratoses will progress to squamous cell carcinoma of the skin. It is not possible to reliably predict which spots will progress to skin cancer and so treatment is recommended to prevent development of skin cancer.  Recommend daily broad spectrum sunscreen SPF 30+ to sun-exposed areas, reapply every 2 hours as needed.  Recommend staying in the shade or wearing long sleeves, sun glasses (UVA+UVB protection) and wide brim hats (4-inch brim around the entire circumference of the hat). Call for new or changing lesions.  Start prescription 5-fluorouracil/calcipotriene cream twice a day for 7 days to affected areas including ears. Patient provided with handout reviewing treatment course and side effects and advised to call or message [place] on  MyChart with any concerns.    Pain, arm, right Right Forearm - Anterior  Recommend follow up with pcp for arm pain in right forearm. No skin changes suggestive of a cause.   Description of pain not consistent with previously performed biopsy (limited to skin)  History of PreCancerous Actinic Keratosis  - site(s) of PreCancerous Actinic Keratosis clear today.  - these may recur and new lesions may form requiring treatment to prevent transformation into skin cancer - observe for new or changing spots and contact [place] for appointment if occur - photoprotection with sun protective clothing; sunglasses and broad spectrum sunscreen with SPF of at least 30 + and frequent self skin exams recommended - yearly exams by a dermatologist recommended for persons with history of PreCancerous Actinic Keratoses  Return in about 4 months (around 10/04/2020) for tbse .  I, Jennifer Clayton, CMA, am acting as scribe for Jennifer Gleason, MD.  Documentation: I have reviewed the above documentation for accuracy and completeness, and I agree with the above.  Jennifer Gleason, MD

## 2020-06-03 NOTE — Patient Instructions (Addendum)
5-Fluorouracil/Calcipotriene Patient Education   Actinic keratoses are the dry, red scaly spots on the skin caused by sun damage. A portion of these spots can turn into skin cancer with time, and treating them can help prevent development of skin cancer.   Treatment of these spots requires removal of the defective skin cells. There are various ways to remove actinic keratoses, including freezing with liquid nitrogen, treatment with creams, or treatment with a blue light procedure in the office.   5-fluorouracil cream is a topical cream used to treat actinic keratoses. It works by interfering with the growth of abnormal fast-growing skin cells, such as actinic keratoses. These cells peel off and are replaced by healthy ones.   5-fluorouracil/calcipotriene is a combination of the 5-fluorouracil cream with a vitamin D analog cream called calcipotriene. The calcipotriene alone does not treat actinic keratoses. However, when it is combined with 5-fluorouracil, it helps the 5-fluorouracil treat the actinic keratoses much faster so that the same results can be achieved with a much shorter treatment time.  INSTRUCTIONS FOR 5-FLUOROURACIL/CALCIPOTRIENE CREAM:   5-fluorouracil/calcipotriene cream typically only needs to be used for 4-7 days. A thin layer should be applied twice a day to the treatment areas recommended by your physician.   If your physician prescribed you separate tubes of 5-fluourouracil and calcipotriene, apply a thin layer of 5-fluorouracil followed by a thin layer of calcipotriene.   Avoid contact with your eyes, nostrils, and mouth. Do not use 5-fluorouracil/calcipotriene cream on infected or open wounds.   You will develop redness, irritation and some crusting at areas where you have pre-cancer damage/actinic keratoses. IF YOU DEVELOP PAIN, BLEEDING, OR SIGNIFICANT CRUSTING, STOP THE TREATMENT EARLY - you have already gotten a good response and the actinic keratoses should clear up  well.  Wash your hands after applying 5-fluorouracil 5% cream on your skin.   A moisturizer or sunscreen with a minimum SPF 30 should be applied each morning.   Once you have finished the treatment, you can apply a thin layer of Vaseline twice a day to irritated areas to soothe and calm the areas more quickly. If you experience significant discomfort, contact your physician.  For some patients it is necessary to repeat the treatment for best results.  SIDE EFFECTS: When using 5-fluorouracil/calcipotriene cream, you may have mild irritation, such as redness, dryness, swelling, or a mild burning sensation. This usually resolves within 2 weeks. The more actinic keratoses you have, the more redness and inflammation you can expect during treatment. Eye irritation has been reported rarely. If this occurs, please let us know.  If you have any trouble using this cream, please call the office. If you have any other questions about this information, please do not hesitate to ask me before you leave the office.  Recommend daily broad spectrum sunscreen SPF 30+ to sun-exposed areas, reapply every 2 hours as needed. Call for new or changing lesions.  Staying in the shade or wearing long sleeves, sun glasses (UVA+UVB protection) and wide brim hats (4-inch brim around the entire circumference of the hat) are also recommended for sun protection.   Recommend taking Heliocare sun protection supplement daily in sunny weather for additional sun protection. For maximum protection on the sunniest days, you can take up to 2 capsules of regular Heliocare OR take 1 capsule of Heliocare Ultra. For prolonged exposure (such as a full day in the sun), you can repeat your dose of the supplement 4 hours after your first dose. Heliocare can be purchased  at [facility] or at [email or web address].       If you have any questions or concerns for your doctor, please call our main line at [phone] and press option 4 to  reach your doctor's medical assistant. If no one answers, please leave a voicemail as directed and we will return your call as soon as possible. Messages left after 4 pm will be answered the following business day.   You may also send [place] a message via [place]. We typically respond to MyChart messages within 1-2 business days.  For prescription refills, please ask your pharmacy to contact our office. Our fax number is [phone].  If you have an urgent issue when the clinic is closed that cannot wait until the next business day, you can page your doctor at the number below.    Please note that while we do our best to be available for urgent issues outside of office hours, we are not available 24/7.   If you have an urgent issue and are unable to reach [place], you may choose to seek medical care at your doctor's office, retail clinic, urgent care center, or emergency room.  If you have a medical emergency, please immediately call 911 or go to the emergency department.  Pager Numbers  - Dr. Nehemiah Massed: [phone]  - Dr. Laurence Ferrari: [phone]  - Dr. Nicole Kindred: [phone]  In the event of inclement weather, please call our main line at [phone] for an update on the status of any delays or closures.  Dermatology Medication Tips: Please keep the boxes that topical medications come in in order to help keep track of the instructions about where and how to use these. Pharmacies typically print the medication instructions only on the boxes and not directly on the medication tubes.   If your medication is too expensive, please contact our office at [phone] option 4 or send [place] a message through [place].   We are unable to tell what your co-pay for medications will be in advance as this is different depending on your insurance coverage. However, we may be able to find a substitute medication at lower cost or fill out paperwork to get insurance to cover a needed medication.   If a prior  authorization is required to get your medication covered by your insurance company, please allow [place] 1-2 business days to complete this process.  Drug prices often vary depending on where the prescription is filled and some pharmacies may offer cheaper prices.  The website www.goodrx.com contains coupons for medications through different pharmacies. The prices here do not account for what the cost may be with help from insurance (it may be cheaper with your insurance), but the website can give you the price if you did not use any insurance.  - You can print the associated coupon and take it with your prescription to the pharmacy.  - You may also stop by our office during regular business hours and pick up a GoodRx coupon card.  - If you need your prescription sent electronically to a different pharmacy, notify our office through [facility] or by phone at [phone] option 4.

## 2020-06-15 ENCOUNTER — Encounter: Payer: Self-pay | Admitting: Dermatology

## 2020-06-16 ENCOUNTER — Ambulatory Visit: Payer: PPO | Admitting: Dermatology

## 2020-08-19 DIAGNOSIS — E890 Postprocedural hypothyroidism: Secondary | ICD-10-CM | POA: Diagnosis not present

## 2020-08-22 ENCOUNTER — Other Ambulatory Visit: Payer: Self-pay | Admitting: Family

## 2020-08-26 ENCOUNTER — Other Ambulatory Visit: Payer: Self-pay

## 2020-08-26 ENCOUNTER — Encounter: Payer: Self-pay | Admitting: Family

## 2020-08-26 ENCOUNTER — Ambulatory Visit (INDEPENDENT_AMBULATORY_CARE_PROVIDER_SITE_OTHER): Payer: PPO | Admitting: Family

## 2020-08-26 DIAGNOSIS — G4700 Insomnia, unspecified: Secondary | ICD-10-CM | POA: Diagnosis not present

## 2020-08-26 DIAGNOSIS — G43809 Other migraine, not intractable, without status migrainosus: Secondary | ICD-10-CM | POA: Diagnosis not present

## 2020-08-26 DIAGNOSIS — I10 Essential (primary) hypertension: Secondary | ICD-10-CM | POA: Diagnosis not present

## 2020-08-26 DIAGNOSIS — C50911 Malignant neoplasm of unspecified site of right female breast: Secondary | ICD-10-CM | POA: Diagnosis not present

## 2020-08-26 DIAGNOSIS — R635 Abnormal weight gain: Secondary | ICD-10-CM | POA: Diagnosis not present

## 2020-08-26 NOTE — Assessment & Plan Note (Signed)
Chronic, controlled.  Continue amlodipine

## 2020-08-26 NOTE — Assessment & Plan Note (Signed)
Chronic, controlled.  Continue Ambien

## 2020-08-26 NOTE — Assessment & Plan Note (Signed)
Chronic, controlled.  Continue Wellbutrin

## 2020-08-26 NOTE — Assessment & Plan Note (Signed)
Chronic, controlled.  Headache similar to headaches in the past.  Continue Imitrex as rescue as needed.  She does not have a history of severe cerebrovascular disease, or severe cardiovascular disease.  No history of MI, stenting or bypass.  She is no history of ischemic stroke

## 2020-08-26 NOTE — Progress Notes (Signed)
Subjective:    Patient ID: Jennifer Clayton, female    DOB: [birth year], 65 y.o.   MRN: [id number]  CC: Jennifer Clayton is a 65 y.o. female who presents today for follow up.   HPI: Feels well today, no complaints  Insomnia-Compliant with Ambien, which continues to be helpful for sleep.  Hypertension-compliant with Norvasc 5 mg. No cp, sob.  Weight gain-compliant with Wellbutrin 300 mg and continues to find medication to be helpful. HLD-compliant with Crestor 5 mg patient continues to follow with endocrinology, Jennifer Clayton 08/19/20 in regards to hypothyroidism.  Continue levothyroxine 100 MCG daily  Migraine-she uses Imitrex 2-3 times per month.HA are similar to HA's in the past.  No vision loss, facial numbness .she does not have a history of severe cerebrovascular disease, or severe cardiovascular disease.  No history of MI, stenting or bypass.  She is no history of ischemic stroke   HISTORY:  Past Medical History:  Diagnosis Date   Breast cancer ([place]) 04/2019   right breast   Cancer ([place])    breast cancer   H/O radioactive iodine thyroid ablation    Headache    MIGRAINES   History of actinic keratosis 05/30/2018   right posterior shoulder   History of actinic keratosis 03/31/2020   right forearm   History of squamous cell carcinoma 10/19/2012   right anterior lower leg / excised 10/31/2012   Hypertension    Hypothyroidism    Personal history of radiation therapy    Past Surgical History:  Procedure Laterality Date   ABDOMINAL HYSTERECTOMY     Age 48, for bleeding and cyst; NO ovaries   BREAST BIOPSY Right 04/25/2019   positive/ done in Dr. Dwyane Luo office   BREAST LUMPECTOMY Right 05/13/2019   positive   BREAST LUMPECTOMY WITH SENTINEL LYMPH NODE BIOPSY Right 05/13/2019   Procedure: BREAST LUMPECTOMY WITH SENTINEL LYMPH NODE BX;  Surgeon: Robert Bellow, MD;  Location: [facility] ORS;  Service: General;  Laterality: Right;   VAGINAL DELIVERY     Family History   Problem Relation Age of Onset   Cancer Mother        lung, non-smoker   Dementia Father        related to traumatic brain injury   Breast cancer Neg Hx     Allergies: Zomepirac, Amoxicillin-pot clavulanate, and Other Current Outpatient Medications on File Prior to Visit  Medication Sig Dispense Refill   amLODipine (NORVASC) 5 MG tablet TAKE 1 TABLET BY MOUTH ONCE DAILY 90 tablet 3   Ascorbic Acid (VITAMIN C PO) Take 1 tablet by mouth daily.     buPROPion (WELLBUTRIN XL) 300 MG 24 hr tablet TAKE 1 TABLET BY MOUTH DAILY 90 tablet 1   calcium carbonate (OS-CAL - DOSED IN MG OF ELEMENTAL CALCIUM) 1250 (500 Ca) MG tablet Take 1 tablet by mouth daily.     denosumab (PROLIA) 60 MG/ML SOSY injection Inject 60 mg into the skin every 6 (six) months.     letrozole (FEMARA) 2.5 MG tablet TAKE ONE TABLET BY MOUTH EVERY DAY 90 tablet 3   levothyroxine (SYNTHROID) 100 MCG tablet TAKE 1 TABLET EVERY DAY ON EMPTY STOMACHWITH A GLASS OF WATER AT LEAST 30-60 MINBEFORE BREAKFAST (Patient taking differently: Take 100 mcg by mouth daily before breakfast.) 90 tablet 0   Multiple Vitamins-Minerals (MULTIVITAMIN ADULT PO) Take 1 tablet by mouth daily.     mupirocin ointment (BACTROBAN) 2 % Apply to skin qd-bid 22 g 1   rosuvastatin (CRESTOR)  5 MG tablet Take 1 tablet (5 mg total) by mouth every evening. 90 tablet 3   SUMAtriptan (IMITREX) 50 MG tablet TAKE 1 TABLET ONCE MAY REPEAT IN 2 HOURSIF NECESSARY 15 tablet 2   zolpidem (AMBIEN) 5 MG tablet TAKE 1 TABLET BY MOUTH AT BEDTIME 30 tablet 2   No current facility-administered medications on file prior to visit.    Social History   Tobacco Use   Smoking status: Never   Smokeless tobacco: Never  Vaping Use   Vaping Use: Never used  Substance Use Topics   Alcohol use: Yes    Alcohol/week: 1.0 standard drink    Types: 1 Glasses of wine per week    Comment: rare   Drug use: Never    Review of Systems  Constitutional:  Negative for chills and fever.   Eyes:  Negative for visual disturbance.  Respiratory:  Negative for cough.   Cardiovascular:  Negative for chest pain and palpitations.  Gastrointestinal:  Negative for nausea and vomiting.  Neurological:  Positive for headaches.     Objective:    BP 112/68 (BP Location: Left Arm, Patient Position: Sitting, Cuff Size: Large)   Pulse 66   Temp 98.2 F (36.8 C) (Oral)   Ht '5\' 3"'$  (1.6 m)   Wt 127 lb 6.4 oz (57.8 kg)   SpO2 98%   BMI 22.57 kg/m  BP Readings from Last 3 Encounters:  08/26/20 112/68  05/27/20 122/76  04/13/20 134/75   Wt Readings from Last 3 Encounters:  08/26/20 127 lb 6.4 oz (57.8 kg)  05/27/20 126 lb 3.2 oz (57.2 kg)  04/13/20 126 lb 12.8 oz (57.5 kg)    Physical Exam Vitals reviewed.  Constitutional:      Appearance: She is well-developed.  Eyes:     Conjunctiva/sclera: Conjunctivae normal.  Cardiovascular:     Rate and Rhythm: Normal rate and regular rhythm.     Pulses: Normal pulses.     Heart sounds: Normal heart sounds.  Pulmonary:     Effort: Pulmonary effort is normal.     Breath sounds: Normal breath sounds. No wheezing, rhonchi or rales.  Skin:    General: Skin is warm and dry.  Neurological:     Mental Status: She is alert.  Psychiatric:        Speech: Speech normal.        Behavior: Behavior normal.        Thought Content: Thought content normal.       Assessment & Plan:   Problem List Items Addressed This Visit       Cardiovascular and Mediastinum   HTN (hypertension)    Chronic, controlled.  Continue amlodipine       Migraine    Chronic, controlled.  Headache similar to headaches in the past.  Continue Imitrex as rescue as needed.  She does not have a history of severe cerebrovascular disease, or severe cardiovascular disease.  No history of MI, stenting or bypass.  She is no history of ischemic stroke         Other   Breast cancer [facility])    Ongoing surveillance with Dr. Donella Clayton, Dr Jennifer Clayton,  Dr. Bary Clayton. Compliant  with femara.  Will follow       Insomnia    Chronic, controlled.  Continue Ambien       Weight gain    Chronic, controlled.  Continue Wellbutrin         I am having Jennifer I. Hollenbach "Debbie" maintain  her Multiple Vitamins-Minerals (MULTIVITAMIN ADULT PO), levothyroxine, Ascorbic Acid (VITAMIN C PO), calcium carbonate, SUMAtriptan, rosuvastatin, buPROPion, amLODipine, mupirocin ointment, letrozole, denosumab, and zolpidem.   No orders of the defined types were placed in this encounter.   Return precautions given.   Risks, benefits, and alternatives of the medications and treatment plan prescribed today were discussed, and patient expressed understanding.   Education regarding symptom management and diagnosis given to patient on AVS.  Continue to follow with Jennifer Hawthorne, FNP for routine health maintenance.   Jennifer Clayton and I agreed with plan.   Jennifer Paris, FNP

## 2020-08-26 NOTE — Assessment & Plan Note (Signed)
Ongoing surveillance with Dr. Donella Stade, Dr Burlene Arnt,  Dr. Bary Castilla. Compliant with femara.  Will follow

## 2020-09-02 ENCOUNTER — Encounter: Payer: Self-pay | Admitting: Family

## 2020-09-04 ENCOUNTER — Other Ambulatory Visit: Payer: Self-pay

## 2020-09-04 DIAGNOSIS — Z0000 Encounter for general adult medical examination without abnormal findings: Secondary | ICD-10-CM

## 2020-09-10 ENCOUNTER — Other Ambulatory Visit (INDEPENDENT_AMBULATORY_CARE_PROVIDER_SITE_OTHER): Payer: PPO

## 2020-09-10 ENCOUNTER — Other Ambulatory Visit: Payer: Self-pay

## 2020-09-10 DIAGNOSIS — Z0000 Encounter for general adult medical examination without abnormal findings: Secondary | ICD-10-CM | POA: Diagnosis not present

## 2020-09-10 LAB — COMPREHENSIVE METABOLIC PANEL
ALT: 12 U/L (ref 0–35)
AST: 15 U/L (ref 0–37)
Albumin: 4.3 g/dL (ref 3.5–5.2)
Alkaline Phosphatase: 46 U/L (ref 39–117)
BUN: 14 mg/dL (ref 6–23)
CO2: 30 mEq/L (ref 19–32)
Calcium: 9.3 mg/dL (ref 8.4–10.5)
Chloride: 100 mEq/L (ref 96–112)
Creatinine, Ser: 0.82 mg/dL (ref 0.40–1.20)
GFR: 73.98 mL/min (ref >60.00)
Glucose, Bld: 87 mg/dL (ref 70–99)
Potassium: 4.7 mEq/L (ref 3.5–5.1)
Sodium: 136 mEq/L (ref 135–145)
Total Bilirubin: 0.5 mg/dL (ref 0.2–1.2)
Total Protein: 6.6 g/dL (ref 6.0–8.3)

## 2020-09-10 LAB — LIPID PANEL
Cholesterol: 152 mg/dL (ref 0–200)
HDL: 68.4 mg/dL (ref >39.00)
LDL Cholesterol: 70 mg/dL (ref 0–99)
NonHDL: 83.23
Total CHOL/HDL Ratio: 2
Triglycerides: 66 mg/dL (ref 0.0–149.0)
VLDL: 13.2 mg/dL (ref 0.0–40.0)

## 2020-09-10 LAB — HEMOGLOBIN A1C: Hgb A1c MFr Bld: 5.8 % (ref 4.6–6.5)

## 2020-09-14 ENCOUNTER — Other Ambulatory Visit: Payer: Self-pay | Admitting: Family

## 2020-09-14 DIAGNOSIS — E785 Hyperlipidemia, unspecified: Secondary | ICD-10-CM

## 2020-09-18 DIAGNOSIS — E890 Postprocedural hypothyroidism: Secondary | ICD-10-CM | POA: Diagnosis not present

## 2020-10-01 ENCOUNTER — Ambulatory Visit: Payer: PPO | Admitting: Dermatology

## 2020-10-09 ENCOUNTER — Other Ambulatory Visit: Payer: Self-pay | Admitting: *Deleted

## 2020-10-09 DIAGNOSIS — M810 Age-related osteoporosis without current pathological fracture: Secondary | ICD-10-CM

## 2020-10-09 DIAGNOSIS — C50411 Malignant neoplasm of upper-outer quadrant of right female breast: Secondary | ICD-10-CM

## 2020-10-12 ENCOUNTER — Inpatient Hospital Stay: Payer: PPO | Attending: Internal Medicine

## 2020-10-12 ENCOUNTER — Ambulatory Visit
Admission: RE | Admit: 2020-10-12 | Discharge: 2020-10-12 | Disposition: A | Discharge status: Home / Self Care | Payer: PPO | Source: Ambulatory Visit | Attending: Radiation Oncology | Admitting: Radiation Oncology

## 2020-10-12 ENCOUNTER — Inpatient Hospital Stay: Payer: PPO

## 2020-10-12 ENCOUNTER — Encounter: Payer: Self-pay | Admitting: Radiation Oncology

## 2020-10-12 ENCOUNTER — Inpatient Hospital Stay (HOSPITAL_BASED_OUTPATIENT_CLINIC_OR_DEPARTMENT_OTHER): Payer: PPO | Admitting: Internal Medicine

## 2020-10-12 DIAGNOSIS — C50411 Malignant neoplasm of upper-outer quadrant of right female breast: Secondary | ICD-10-CM | POA: Diagnosis not present

## 2020-10-12 DIAGNOSIS — R232 Flushing: Secondary | ICD-10-CM | POA: Insufficient documentation

## 2020-10-12 DIAGNOSIS — M810 Age-related osteoporosis without current pathological fracture: Secondary | ICD-10-CM

## 2020-10-12 DIAGNOSIS — M2550 Pain in unspecified joint: Secondary | ICD-10-CM | POA: Insufficient documentation

## 2020-10-12 DIAGNOSIS — Z79899 Other long term (current) drug therapy: Secondary | ICD-10-CM | POA: Diagnosis not present

## 2020-10-12 DIAGNOSIS — Z923 Personal history of irradiation: Secondary | ICD-10-CM | POA: Insufficient documentation

## 2020-10-12 DIAGNOSIS — Z79811 Long term (current) use of aromatase inhibitors: Secondary | ICD-10-CM | POA: Diagnosis not present

## 2020-10-12 DIAGNOSIS — I10 Essential (primary) hypertension: Secondary | ICD-10-CM | POA: Insufficient documentation

## 2020-10-12 DIAGNOSIS — Z170 Estrogen receptor positive status [ER+]: Secondary | ICD-10-CM | POA: Insufficient documentation

## 2020-10-12 DIAGNOSIS — Z853 Personal history of malignant neoplasm of breast: Secondary | ICD-10-CM | POA: Diagnosis not present

## 2020-10-12 DIAGNOSIS — Z08 Encounter for follow-up examination after completed treatment for malignant neoplasm: Secondary | ICD-10-CM | POA: Diagnosis not present

## 2020-10-12 DIAGNOSIS — E039 Hypothyroidism, unspecified: Secondary | ICD-10-CM | POA: Insufficient documentation

## 2020-10-12 LAB — CBC WITH DIFFERENTIAL/PLATELET
Abs Immature Granulocytes: 0.01 10*3/uL (ref 0.00–0.07)
Basophils Absolute: 0.1 10*3/uL (ref 0.0–0.1)
Basophils Relative: 1 %
Eosinophils Absolute: 0.2 10*3/uL (ref 0.0–0.5)
Eosinophils Relative: 3 %
HCT: 39 % (ref 36.0–46.0)
Hemoglobin: 13 g/dL (ref 12.0–15.0)
Immature Granulocytes: 0 %
Lymphocytes Relative: 26 %
Lymphs Abs: 1.5 10*3/uL (ref 0.7–4.0)
MCH: 31.7 pg (ref 26.0–34.0)
MCHC: 33.3 g/dL (ref 30.0–36.0)
MCV: 95.1 fL (ref 80.0–100.0)
Monocytes Absolute: 0.6 10*3/uL (ref 0.1–1.0)
Monocytes Relative: 10 %
Neutro Abs: 3.4 10*3/uL (ref 1.7–7.7)
Neutrophils Relative %: 60 %
Platelets: 219 10*3/uL (ref 150–400)
RBC: 4.1 MIL/uL (ref 3.87–5.11)
RDW: 11.9 % (ref 11.5–15.5)
WBC: 5.7 10*3/uL (ref 4.0–10.5)
nRBC: 0 % (ref 0.0–0.2)

## 2020-10-12 LAB — BASIC METABOLIC PANEL
Anion gap: 6 (ref 5–15)
BUN: 14 mg/dL (ref 8–23)
CO2: 29 mmol/L (ref 22–32)
Calcium: 8.6 mg/dL — ABNORMAL LOW (ref 8.9–10.3)
Chloride: 98 mmol/L (ref 98–111)
Creatinine, Ser: 0.78 mg/dL (ref 0.44–1.00)
GFR, Estimated: >60 mL/min (ref >60)
Glucose, Bld: 98 mg/dL (ref 70–99)
Potassium: 4.2 mmol/L (ref 3.5–5.1)
Sodium: 133 mmol/L — ABNORMAL LOW (ref 135–145)

## 2020-10-12 MED ORDER — DENOSUMAB 60 MG/ML ~~LOC~~ SOSY
60.0000 mg | PREFILLED_SYRINGE | Freq: Once | SUBCUTANEOUS | Status: AC
  Administered 2020-10-12: 60 mg via SUBCUTANEOUS

## 2020-10-12 NOTE — Progress Notes (Signed)
Radiation Oncology Follow up Note  Name: Jennifer Clayton   Date:   10/12/2020 MRN:  [id number] DOB: [birth year]    This 65 y.o. female presents to the clinic today for 1 year follow-up status post whole breast radiation to her right breast for stage I ER/PR positive invasive mammary carcinoma.  REFERRING PROVIDER: Burnard Hawthorne, FNP  HPI: Patient is a 65 year old female now out 1 year having completed whole breast radiation to her right breast for stage I ER/PR positive invasive mammary carcinoma seen today in routine follow-up she is doing well.  She does specifically denies breast tenderness cough or bone pain.  She had mammograms.  Back in March which I have reviewed which were BI-RADS 2 benign.  She is currently on Femara tolerating it well without side effect.  COMPLICATIONS OF TREATMENT: none  FOLLOW UP COMPLIANCE: keeps appointments   PHYSICAL EXAM:  BP (!) (P) 150/79 (BP Location: Left Arm, Patient Position: Sitting)   Pulse (P) 76   Temp (!) (P) 97 F (36.1 C) (Tympanic)   Resp (P) 18   Wt (P) 129 lb 6.4 oz (58.7 kg)   BMI (P) 22.92 kg/m  Lungs are clear to A&P cardiac examination essentially unremarkable with regular rate and rhythm. No dominant mass or nodularity is noted in either breast in 2 positions examined. Incision is well-healed. No axillary or supraclavicular adenopathy is appreciated. Cosmetic result is excellent.  Well-developed well-nourished patient in NAD. HEENT reveals PERLA, EOMI, discs not visualized.  Oral cavity is clear. No oral mucosal lesions are identified. Neck is clear without evidence of cervical or supraclavicular adenopathy. Lungs are clear to A&P. Cardiac examination is essentially unremarkable with regular rate and rhythm without murmur rub or thrill. Abdomen is benign with no organomegaly or masses noted. Motor sensory and DTR levels are equal and symmetric in the upper and lower extremities. Cranial nerves II through XII are grossly intact.  Proprioception is intact. No peripheral adenopathy or edema is identified. No motor or sensory levels are noted. Crude visual fields are within normal range.  RADIOLOGY RESULTS: Mammograms reviewed compatible with above-stated findings  PLAN: Present time patient is doing well with no evidence of disease 1 year out and pleased with her overall progress.  She has an excellent cosmetic result.  I have asked to see her back in 1 year for follow-up.  She continues on Femara without side effect.  Patient knows to call with any concerns.  I would like to take this opportunity to thank you for allowing me to participate in the care of your patient.Noreene Filbert, MD

## 2020-10-12 NOTE — Progress Notes (Signed)
[facility] NOTE  Patient Care Team: Burnard Hawthorne, FNP as PCP - General (Family Medicine) Theodore Demark, RN as Oncology Nurse Navigator Noreene Filbert, MD as Radiation Oncologist (Radiation Oncology) Bary Castilla Forest Gleason, MD as Consulting Physician (General Surgery) Cammie Sickle, MD as Consulting Physician (Internal Medicine)  CHIEF COMPLAINTS/PURPOSE OF CONSULTATION: Breast cancer  #  Oncology History Overview Note  # MARCH 2021-right breast 11 o'clock position-s/p lumpectomy; SLNBx [Dr. Tollie Pizza ]pT1c (15 mm) p N0-grade-1; ER/PR positive HER-2 negative; Oncotype low risk [risk of recurrence is 5%]-no chemotherapy; s/p RT [08/12/2019]- Oncotyoe- 5% risk of recurrence. No chemo.   #Mid July 2021-letrozole  #Osteoporosis 2021-Prolia  # SURVIVORSHIP:   # GENETICS:   DIAGNOSIS: Right breast cancer  STAGE:   1      ;  GOALS: Cure  CURRENT/MOST RECENT THERAPY : Letrozole    Carcinoma of upper-outer quadrant of right breast in female, estrogen receptor positive ([place])  05/22/2019 Initial Diagnosis   Carcinoma of upper-outer quadrant of right breast in female, estrogen receptor positive ([place])      HISTORY OF PRESENTING ILLNESS: Alone.  Ambulating independently. Jennifer Clayton 65 y.o.  female stage I breast cancer ER/PR positive Her-2 negative on letrozole is here for follow-up  Mild hot flashes.  Mild chronic joint pains.  Not any worse.  No shortness of breath or cough.   Review of Systems  Constitutional:  Negative for chills, diaphoresis, fever, malaise/fatigue and weight loss.  HENT:  Negative for nosebleeds and sore throat.   Eyes:  Negative for double vision.  Respiratory:  Negative for cough, hemoptysis, sputum production, shortness of breath and wheezing.   Cardiovascular:  Negative for chest pain, palpitations, orthopnea and leg swelling.  Gastrointestinal:  Negative for abdominal pain, blood in stool, constipation, diarrhea,  heartburn, melena, nausea and vomiting.  Genitourinary:  Negative for dysuria, frequency and urgency.  Musculoskeletal:  Positive for joint pain. Negative for back pain.  Skin: Negative.  Negative for itching and rash.  Neurological:  Negative for dizziness, tingling, focal weakness, weakness and headaches.  Endo/Heme/Allergies:  Does not bruise/bleed easily.  Psychiatric/Behavioral:  Negative for depression. The patient is not nervous/anxious and does not have insomnia.     MEDICAL HISTORY:  Past Medical History:  Diagnosis Date   Breast cancer ([place]) 04/2019   right breast   Cancer ([place])    breast cancer   H/O radioactive iodine thyroid ablation    Headache    MIGRAINES   History of actinic keratosis 05/30/2018   right posterior shoulder   History of actinic keratosis 03/31/2020   right forearm   History of squamous cell carcinoma 10/19/2012   right anterior lower leg / excised 10/31/2012   Hypertension    Hypothyroidism    Personal history of radiation therapy     SURGICAL HISTORY: Past Surgical History:  Procedure Laterality Date   ABDOMINAL HYSTERECTOMY     Age 5, for bleeding and cyst; NO ovaries   BREAST BIOPSY Right 04/25/2019   positive/ done in Dr. Dwyane Luo office   BREAST LUMPECTOMY Right 05/13/2019   positive   BREAST LUMPECTOMY WITH SENTINEL LYMPH NODE BIOPSY Right 05/13/2019   Procedure: BREAST LUMPECTOMY WITH SENTINEL LYMPH NODE BX;  Surgeon: Robert Bellow, MD;  Location: [facility] ORS;  Service: General;  Laterality: Right;   VAGINAL DELIVERY      SOCIAL HISTORY: Social History   Socioeconomic History   Marital status: Married    Spouse name: Not  on file   Number of children: Not on file   Years of education: Not on file   Highest education level: Not on file  Occupational History   Not on file  Tobacco Use   Smoking status: Never   Smokeless tobacco: Never  Vaping Use   Vaping Use: Never used  Substance and Sexual Activity   Alcohol use:  Yes    Alcohol/week: 1.0 standard drink    Types: 1 Glasses of wine per week    Comment: rare   Drug use: Never   Sexual activity: Not on file  Other Topics Concern   Not on file  Social History Narrative   Lives in [place] with husband and son and 4 grandchildren.      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       Never smoked; social alcohol.       Social Determinants of Health   Financial Resource Strain: Not on file  Food Insecurity: No Food Insecurity   Worried About Charity fundraiser in the Last Year: Never true   Ran Out of Food in the Last Year: Never true  Transportation Needs: No Transportation Needs   Lack of Transportation (Medical): No   Lack of Transportation (Non-Medical): No  Physical Activity: Sufficiently Active   Days of Exercise per Week: 3 days   Minutes of Exercise per Session: 60 min  Stress: No Stress Concern Present   Feeling of Stress : Not at all  Social Connections: Unknown   Frequency of Communication with Friends and Family: More than three times a week   Frequency of Social Gatherings with Friends and Family: More than three times a week   Attends Religious Services: Not on Electrical engineer or Organizations: Not on file   Attends Archivist Meetings: Not on file   Marital Status: Not on file  Intimate Partner Violence: Not At Risk   Fear of Current or Ex-Partner: No   Emotionally Abused: No   Physically Abused: No   Sexually Abused: No    FAMILY HISTORY: Family History  Problem Relation Age of Onset   Cancer Mother        lung, non-smoker   Dementia Father        related to traumatic brain injury   Breast cancer Neg Hx     ALLERGIES:  is allergic to zomepirac, amoxicillin-pot clavulanate, and other.  MEDICATIONS:  Current Outpatient Medications  Medication Sig Dispense Refill    amLODipine (NORVASC) 5 MG tablet TAKE 1 TABLET BY MOUTH ONCE DAILY 90 tablet 3   Ascorbic Acid (VITAMIN C PO) Take 1 tablet by mouth daily.     buPROPion (WELLBUTRIN XL) 300 MG 24 hr tablet TAKE 1 TABLET BY MOUTH DAILY 90 tablet 1   calcium carbonate (OS-CAL - DOSED IN MG OF ELEMENTAL CALCIUM) 1250 (500 Ca) MG tablet Take 1 tablet by mouth daily.     denosumab (PROLIA) 60 MG/ML SOSY injection Inject 60 mg into the skin every 6 (six) months.     letrozole (FEMARA) 2.5 MG tablet TAKE ONE TABLET BY MOUTH EVERY DAY 90 tablet 3   levothyroxine (SYNTHROID) 100 MCG tablet TAKE 1 TABLET EVERY DAY ON EMPTY STOMACHWITH A GLASS OF WATER AT LEAST 30-60 MINBEFORE BREAKFAST (Patient taking differently: Take 100  mcg by mouth daily before breakfast.) 90 tablet 0   Multiple Vitamins-Minerals (MULTIVITAMIN ADULT PO) Take 1 tablet by mouth daily.     mupirocin ointment (BACTROBAN) 2 % Apply to skin qd-bid 22 g 1   rosuvastatin (CRESTOR) 5 MG tablet TAKE 1 TABLET BY MOUTH EVERY EVENING 90 tablet 3   SUMAtriptan (IMITREX) 50 MG tablet TAKE 1 TABLET ONCE MAY REPEAT IN 2 HOURSIF NECESSARY 15 tablet 2   zolpidem (AMBIEN) 5 MG tablet TAKE 1 TABLET BY MOUTH AT BEDTIME 30 tablet 2   No current facility-administered medications for this visit.      Marland Kitchen  PHYSICAL EXAMINATION: ECOG PERFORMANCE STATUS: 0 - Asymptomatic  There were no vitals filed for this visit.  There were no vitals filed for this visit.   Physical Exam HENT:     Head: Normocephalic and atraumatic.     Mouth/Throat:     Pharynx: No oropharyngeal exudate.  Eyes:     Pupils: Pupils are equal, round, and reactive to light.  Cardiovascular:     Rate and Rhythm: Normal rate and regular rhythm.  Pulmonary:     Effort: Pulmonary effort is normal. No respiratory distress.     Breath sounds: Normal breath sounds. No wheezing.  Abdominal:     General: Bowel sounds are normal. There is no distension.     Palpations: Abdomen is soft. There is no mass.      Tenderness: There is no abdominal tenderness. There is no guarding or rebound.  Musculoskeletal:        General: No tenderness. Normal range of motion.     Cervical back: Normal range of motion and neck supple.  Skin:    General: Skin is warm.  Neurological:     Mental Status: She is alert and oriented to person, place, and time.  Psychiatric:        Mood and Affect: Affect normal.     LABORATORY DATA:  I have reviewed the data as listed Lab Results  Component Value Date   WBC 5.7 10/12/2020   HGB 13.0 10/12/2020   HCT 39.0 10/12/2020   MCV 95.1 10/12/2020   PLT 219 10/12/2020   Recent Labs    12/03/19 0928 04/13/20 1012 05/13/20 0811 09/10/20 0746 10/12/20 1013  NA 136 135 135 136 133*  K 4.3 4.5 4.9 4.7 4.2  CL 99 98 102 100 98  CO2 $Re'30 28 27 30 29  'RUV$ GLUCOSE 82 92 89 87 98  BUN $Re'10 14 7 14 14  'EeR$ CREATININE 0.79 0.81 0.79 0.82 0.78  CALCIUM 8.8 9.1 8.5 9.3 8.6*  GFRNONAA  --  >60  --   --  >60  PROT 6.3  --  6.7 6.6  --   ALBUMIN 3.9  --  3.9 4.3  --   AST 13  --  19 15  --   ALT 10  --  12 12  --   ALKPHOS 55  --  53 46  --   BILITOT 0.4  --  0.5 0.5  --     RADIOGRAPHIC STUDIES: I have personally reviewed the radiological images as listed and agreed with the findings in the report. No results found.  ASSESSMENT & PLAN:   Carcinoma of upper-outer quadrant of right breast in female, estrogen receptor positive ([place]) # Invasive mammary carcinoma right breast-pT1cpN0; grade 1 ER positive PR negative HER-2 negative.  On adjuvant Letrozole. STABLE; Tolerating well no major side effects noted except for mild joint pains/hot  flashes.Mammo-March 2022 [Dr.Byrn]- [facility]   # joint pains- STABLE- G-1 sec to letrozole.   # Hot flashes-STABLE; G-1 sec to letrozole.   # OSTEOPOROSIS [BMD-2021]-T score -3; continue calcium plus vitamin D plus exercise program. On Ppolia;  Discussed the potential benefits and/side effects  Mild  hypocalcemia.  Today calcium 8.5; increase Vit  D 1000/day.  Proceed with Prolia today.  # DISPOSITION: # Prolia today # Follow up in 6 months- MD; labs- BMP;prolia SQ-  Dr.B  All questions were answered. The patient/family knows to call the clinic with any problems, questions or concerns.    Cammie Sickle, MD 10/28/2020 5:08 PM

## 2020-10-12 NOTE — Assessment & Plan Note (Addendum)
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  On adjuvant Letrozole. STABLE; Tolerating well no major side effects noted except for mild joint pains/hot flashes.Mammo-March 2022 [Dr.Byrnnett]- [facility]   # joint pains- STABLE- G-1 sec to letrozole.   # Hot flashes-STABLE; G-1 sec to letrozole.   # OSTEOPOROSIS [BMD-2021]-T score -3; continue calcium plus vitamin D plus exercise program. On Ppolia;  Discussed the potential benefits and/side effects  Mild  hypocalcemia.  Today calcium 8.5; increase Vit D 1000/day.  Proceed with Prolia today.  # DISPOSITION: # Prolia today # Follow up in 6 months- MD; labs- BMP;prolia SQ-  Dr.B

## 2020-10-12 NOTE — Progress Notes (Signed)
Survivorship Care Plan visit completed.  Treatment summary reviewed and given to patient.  ASCO answers booklet reviewed and given to patient.  CARE program and Cancer Transitions discussed with patient along with other resources cancer center offers to patients and caregivers.  Patient verbalized understanding.    

## 2020-10-12 NOTE — Progress Notes (Signed)
Ca 8.6, per MD ok to give prolia.

## 2020-10-28 ENCOUNTER — Encounter: Payer: Self-pay | Admitting: Internal Medicine

## 2020-11-24 ENCOUNTER — Other Ambulatory Visit: Payer: Self-pay | Admitting: Family

## 2020-11-24 NOTE — Telephone Encounter (Signed)
RX Refill: Jennifer Clayton Last Seen: 08-26-20 Last Ordered: 08-24-20 Next Appt: NA

## 2020-12-03 ENCOUNTER — Encounter: Payer: Self-pay | Admitting: Dermatology

## 2020-12-03 ENCOUNTER — Ambulatory Visit: Payer: PPO | Admitting: Dermatology

## 2020-12-03 ENCOUNTER — Other Ambulatory Visit: Payer: Self-pay

## 2020-12-03 DIAGNOSIS — Z872 Personal history of diseases of the skin and subcutaneous tissue: Secondary | ICD-10-CM | POA: Diagnosis not present

## 2020-12-03 DIAGNOSIS — L814 Other melanin hyperpigmentation: Secondary | ICD-10-CM

## 2020-12-03 DIAGNOSIS — D229 Melanocytic nevi, unspecified: Secondary | ICD-10-CM

## 2020-12-03 DIAGNOSIS — L821 Other seborrheic keratosis: Secondary | ICD-10-CM | POA: Diagnosis not present

## 2020-12-03 DIAGNOSIS — L578 Other skin changes due to chronic exposure to nonionizing radiation: Secondary | ICD-10-CM

## 2020-12-03 DIAGNOSIS — Z85828 Personal history of other malignant neoplasm of skin: Secondary | ICD-10-CM

## 2020-12-03 DIAGNOSIS — Z1283 Encounter for screening for malignant neoplasm of skin: Secondary | ICD-10-CM

## 2020-12-03 DIAGNOSIS — D1800 Hemangioma unspecified site: Secondary | ICD-10-CM

## 2020-12-03 MED ORDER — FLUOROURACIL 5 % EX CREA: TOPICAL_CREAM | Freq: Two times a day (BID) | CUTANEOUS | 0 refills | Status: DC

## 2020-12-03 NOTE — Progress Notes (Signed)
error 

## 2020-12-03 NOTE — Patient Instructions (Addendum)
Start 5-fluorouracil/calcipotriene cream twice a day for 4 days to affected areas including eyebrow, forehead and for 7 days to hands, left chest, repeat in 1 month if still crusty. Prescription sent to [facility]. Patient provided with contact information for pharmacy and advised the pharmacy will mail the prescription to their home. Patient provided with handout reviewing treatment course and side effects and advised to call or message [place] on MyChart with any concerns.  Recommend taking Heliocare sun protection supplement daily in sunny weather for additional sun protection. For maximum protection on the sunniest days, you can take up to 2 capsules of regular Heliocare OR take 1 capsule of Heliocare Ultra. For prolonged exposure (such as a full day in the sun), you can repeat your dose of the supplement 4 hours after your first dose. Heliocare can be purchased at [facility] or at [email or web address].    Melanoma ABCDEs  Melanoma is the most dangerous type of skin cancer, and is the leading cause of death from skin disease.  You are more likely to develop melanoma if you: Have light-colored skin, light-colored eyes, or red or blond hair Spend a lot of time in the sun Tan regularly, either outdoors or in a tanning bed Have had blistering sunburns, especially during childhood Have a close family member who has had a melanoma Have atypical moles or large birthmarks  Early detection of melanoma is key since treatment is typically straightforward and cure rates are extremely high if we catch it early.   The first sign of melanoma is often a change in a mole or a new dark spot.  The ABCDE system is a way of remembering the signs of melanoma.  A for asymmetry:  The two halves do not match. B for border:  The edges of the growth are irregular. C for color:  A mixture of colors are present instead of an even brown color. D for diameter:  Melanomas are usually (but not always) greater than  13mm - the size of a pencil eraser. E for evolution:  The spot keeps changing in size, shape, and color.  Please check your skin once per month between visits. You can use a small mirror in front and a large mirror behind you to keep an eye on the back side or your body.   If you see any new or changing lesions before your next follow-up, please call to schedule a visit.  Please continue daily skin protection including broad spectrum sunscreen SPF 30+ to sun-exposed areas, reapplying every 2 hours as needed when you're outdoors.    If you have any questions or concerns for your doctor, please call our main line at [phone] and press option 4 to reach your doctor's medical assistant. If no one answers, please leave a voicemail as directed and we will return your call as soon as possible. Messages left after 4 pm will be answered the following business day.   You may also send [place] a message via [place]. We typically respond to MyChart messages within 1-2 business days.  For prescription refills, please ask your pharmacy to contact our office. Our fax number is [phone].  If you have an urgent issue when the clinic is closed that cannot wait until the next business day, you can page your doctor at the number below.    Please note that while we do our best to be available for urgent issues outside of office hours, we are not available 24/7.   If you  have an urgent issue and are unable to reach [place], you may choose to seek medical care at your doctor's office, retail clinic, urgent care center, or emergency room.  If you have a medical emergency, please immediately call 911 or go to the emergency department.  Pager Numbers  - Dr. Nehemiah Massed: [phone]  - Dr. Laurence Ferrari: [phone]  - Dr. Nicole Kindred: [phone]  In the event of inclement weather, please call our main line at [phone] for an update on the status of any delays or closures.  Dermatology Medication Tips: Please keep the  boxes that topical medications come in in order to help keep track of the instructions about where and how to use these. Pharmacies typically print the medication instructions only on the boxes and not directly on the medication tubes.   If your medication is too expensive, please contact our office at [phone] option 4 or send [place] a message through [place].   We are unable to tell what your co-pay for medications will be in advance as this is different depending on your insurance coverage. However, we may be able to find a substitute medication at lower cost or fill out paperwork to get insurance to cover a needed medication.   If a prior authorization is required to get your medication covered by your insurance company, please allow [place] 1-2 business days to complete this process.  Drug prices often vary depending on where the prescription is filled and some pharmacies may offer cheaper prices.  The website www.goodrx.com contains coupons for medications through different pharmacies. The prices here do not account for what the cost may be with help from insurance (it may be cheaper with your insurance), but the website can give you the price if you did not use any insurance.  - You can print the associated coupon and take it with your prescription to the pharmacy.  - You may also stop by our office during regular business hours and pick up a GoodRx coupon card.  - If you need your prescription sent electronically to a different pharmacy, notify our office through [facility] or by phone at [phone] option 4.

## 2020-12-03 NOTE — Progress Notes (Signed)
Follow-Up Visit   Subjective  Jennifer Clayton is a 65 y.o. female who presents for the following: FBSE (Patient here for full body skin exam and skin cancer screening. Patient with hx of AK's and SCC. She has noticed a spot at right eyebrow that feels like a sore, present for a few months. ).  The following portions of the chart were reviewed this encounter and updated as appropriate:   Tobacco  Allergies  Meds  Problems  Med Hx  Surg Hx  Fam Hx      Review of Systems:  No other skin or systemic complaints except as noted in HPI or Assessment and Plan.  Objective  Well appearing patient in no apparent distress; mood and affect are within normal limits.  A full examination was performed including scalp, head, eyes, ears, nose, lips, neck, chest, axillae, abdomen, back, buttocks, bilateral upper extremities, bilateral lower extremities, hands, feet, fingers, toes, fingernails, and toenails. All findings within normal limits unless otherwise noted below.    Assessment & Plan   History of PreCancerous Actinic Keratosis  - site(s) of PreCancerous Actinic Keratosis clear today. - these may recur and new lesions may form requiring treatment to prevent transformation into skin cancer - observe for new or changing spots and contact [facility] for appointment if occur - photoprotection with sun protective clothing; sunglasses and broad spectrum sunscreen with SPF of at least 30 + and frequent self skin exams recommended - yearly exams by a dermatologist recommended for persons with history of PreCancerous Actinic Keratoses  History of Squamous Cell Carcinoma of the Skin - No evidence of recurrence today - No lymphadenopathy - Recommend regular full body skin exams - Recommend daily broad spectrum sunscreen SPF 30+ to sun-exposed areas, reapply every 2 hours as needed.  - Call if any new or changing lesions are noted between office visits  Lentigines - Scattered tan  macules - Due to sun exposure - Benign-appearing, observe - Recommend daily broad spectrum sunscreen SPF 30+ to sun-exposed areas, reapply every 2 hours as needed. - Call for any changes  Seborrheic Keratoses - Stuck-on, waxy, tan-brown papules and/or plaques  - Benign-appearing - Discussed benign etiology and prognosis. - Observe - Call for any changes  Melanocytic Nevi - Tan-brown and/or pink-flesh-colored symmetric macules and papules - Benign appearing on exam today - Observation - Call clinic for new or changing moles - Recommend daily use of broad spectrum spf 30+ sunscreen to sun-exposed areas.   Hemangiomas - Red papules - Discussed benign nature - Observe - Call for any changes  Actinic Damage - Severe, confluent actinic changes with pre-cancerous actinic keratoses  - Severe, chronic, not at goal, secondary to cumulative UV radiation exposure over time - diffuse scaly erythematous macules and papules with underlying dyspigmentation - Discussed Prescription "Field Treatment" for Severe, Chronic Confluent Actinic Changes with Pre-Cancerous Actinic Keratoses Field treatment involves treatment of an entire area of skin that has confluent Actinic Changes (Sun/ Ultraviolet light damage) and PreCancerous Actinic Keratoses by method of PhotoDynamic Therapy (PDT) and/or prescription Topical Chemotherapy agents such as 5-fluorouracil, 5-fluorouracil/calcipotriene, and/or imiquimod.  The purpose is to decrease the number of clinically evident and subclinical PreCancerous lesions to prevent progression to development of skin cancer by chemically destroying early precancer changes that may or may not be visible.  It has been shown to reduce the risk of developing skin cancer in the treated area. As a result of treatment, redness, scaling, crusting, and open sores may  occur during treatment course. One or more than one of these methods may be used and may have to be used several times to  control, suppress and eliminate the PreCancerous changes. Discussed treatment course, expected reaction, and possible side effects. - Recommend daily broad spectrum sunscreen SPF 30+ to sun-exposed areas, reapply every 2 hours as needed.  - Staying in the shade or wearing long sleeves, sun glasses (UVA+UVB protection) and wide brim hats (4-inch brim around the entire circumference of the hat) are also recommended. - Call for new or changing lesions. - Start 5-fluorouracil/calcipotriene cream twice a day for 4 days to affected areas including right eyebrow and forehead and for 7 days to hands, left chest. Repeat in 1 month if still crusty. Prescription sent to [facility]. Patient provided with contact information for pharmacy and advised the pharmacy will mail the prescription to their home. Patient provided with handout reviewing treatment course and side effects and advised to call or message [place] on MyChart with any concerns.   Skin cancer screening performed today.  Return in about 1 year (around 12/03/2021) for TBSE.  Graciella Belton, RMA, am acting as scribe for Forest Gleason, MD .  Documentation: I have reviewed the above documentation for accuracy and completeness, and I agree with the above.  Forest Gleason, MD

## 2021-01-11 ENCOUNTER — Encounter: Payer: Self-pay | Admitting: Family

## 2021-01-12 ENCOUNTER — Encounter: Payer: Self-pay | Admitting: Family

## 2021-01-12 ENCOUNTER — Telehealth (INDEPENDENT_AMBULATORY_CARE_PROVIDER_SITE_OTHER): Payer: PPO | Admitting: Family

## 2021-01-12 VITALS — Ht 63.0 in | Wt 127.0 lb

## 2021-01-12 DIAGNOSIS — J40 Bronchitis, not specified as acute or chronic: Secondary | ICD-10-CM

## 2021-01-12 MED ORDER — HYDROCOD POLST-CPM POLST ER 10-8 MG/5ML PO SUER: 5.0000 mL | Freq: Every evening | ORAL | 0 refills | Status: DC | PRN

## 2021-01-12 MED ORDER — AZITHROMYCIN 250 MG PO TABS: ORAL_TABLET | ORAL | 0 refills | Status: AC

## 2021-01-12 NOTE — Patient Instructions (Addendum)
Start tussionex  No ambien with tussionex.  Please take cough medication at night only as needed. As we discussed, I do not recommend dosing throughout the day as coughing is a protective mechanism . It also helps to break up thick mucous.  Do not take cough suppressants with alcohol as can lead to trouble breathing. Advise caution if taking cough suppressant and operating machinery ( i.e driving a car) as you may feel very tired.   Start zpak  Ensure to take probiotics while on antibiotics and also for 2 weeks after completion. This can either be by eating yogurt daily or taking a probiotic supplement over the counter such as Culturelle.It is important to re-colonize the gut with good bacteria and also to prevent any diarrheal infections associated with antibiotic use.

## 2021-01-12 NOTE — Progress Notes (Signed)
Virtual Visit via Video Note  I connected with@  on 01/12/21 at 11:00 AM EST by a video enabled telemedicine application and verified that I am speaking with the correct person using two identifiers.  Location patient: home Location provider:work  Persons participating in the virtual visit: patient, provider  I discussed the limitations of evaluation and management by telemedicine and the availability of in person appointments. The patient expressed understanding and agreed to proceed.   HPI: Acute visit for productive cough x 3 weeks, worsening Phlegm is yellow in color.   She is coughing throughout the day and night She has been taking Nyquil cough , Mucinex DM with some relief. Reports tmax 98.9. . Right sided sinus pressure extending to ear which has resolved today, no swelling.  No sob, wheezing, CP,  chills, ear discharge, tooth pain  No recent antibiotic.   Has been codeine in the past for cough  Home test negative for covid.    ROS: See pertinent positives and negatives per HPI.    EXAM:  VITALS per patient if applicable: Ht 5\' 3"  (1.6 m)    Wt 127 lb (57.6 kg)    BMI 22.50 kg/m  BP Readings from Last 3 Encounters:  10/12/20 (!) (P) 150/79  08/26/20 112/68  05/27/20 122/76   Wt Readings from Last 3 Encounters:  01/12/21 127 lb (57.6 kg)  10/12/20 (P) 129 lb 6.4 oz (58.7 kg)  08/26/20 127 lb 6.4 oz (57.8 kg)    GENERAL: alert, oriented, appears well and in no acute distress  HEENT: atraumatic, conjunttiva clear, no obvious abnormalities on inspection of external nose and ears  NECK: normal movements of the head and neck  LUNGS: on inspection no signs of respiratory distress, breathing rate appears normal, no obvious gross SOB, gasping or wheezing  CV: no obvious cyanosis  MS: moves all visible extremities without noticeable abnormality  PSYCH/NEURO: pleasant and cooperative, no obvious depression or anxiety, speech and thought processing grossly  intact  ASSESSMENT AND PLAN:  Discussed the following assessment and plan:  Problem List Items Addressed This Visit       Respiratory   Bronchitis - Primary    No acute respiratory distress.  Duration 3 weeks.  Advised most likely bacterial URI.  Start azithromycin.  Provided her with prescription for Tussionex.  Advised her not to use Ambien , alcohol or anything else that is sedating while she is using Tussionex.  She very much understood this.  She will let me know how she is doing.      Relevant Medications   azithromycin (ZITHROMAX) 250 MG tablet   chlorpheniramine-HYDROcodone (TUSSIONEX PENNKINETIC ER) 10-8 MG/5ML SUER    -we discussed possible serious and likely etiologies, options for evaluation and workup, limitations of telemedicine visit vs in person visit, treatment, treatment risks and precautions. Pt prefers to treat via telemedicine empirically rather then risking or undertaking an in person visit at this moment.  .   I discussed the assessment and treatment plan with the patient. The patient was provided an opportunity to ask questions and all were answered. The patient agreed with the plan and demonstrated an understanding of the instructions.   The patient was advised to call back or seek an in-person evaluation if the symptoms worsen or if the condition fails to improve as anticipated.   Mable Paris, FNP

## 2021-01-12 NOTE — Telephone Encounter (Signed)
Spoke with pt and she has been scheduled with Joycelyn Schmid this morning at 11 am.

## 2021-01-12 NOTE — Assessment & Plan Note (Signed)
No acute respiratory distress.  Duration 3 weeks.  Advised most likely bacterial URI.  Start azithromycin.  Provided her with prescription for Tussionex.  Advised her not to use Ambien , alcohol or anything else that is sedating while she is using Tussionex.  She very much understood this.  She will let me know how she is doing.

## 2021-02-16 ENCOUNTER — Other Ambulatory Visit: Payer: Self-pay | Admitting: Family

## 2021-02-16 DIAGNOSIS — R635 Abnormal weight gain: Secondary | ICD-10-CM

## 2021-03-15 ENCOUNTER — Other Ambulatory Visit: Payer: Self-pay | Admitting: General Surgery

## 2021-03-15 DIAGNOSIS — C50411 Malignant neoplasm of upper-outer quadrant of right female breast: Secondary | ICD-10-CM

## 2021-03-16 ENCOUNTER — Ambulatory Visit (INDEPENDENT_AMBULATORY_CARE_PROVIDER_SITE_OTHER): Payer: PPO

## 2021-03-16 VITALS — Ht 63.0 in | Wt 127.0 lb

## 2021-03-16 DIAGNOSIS — Z0000 Encounter for general adult medical examination without abnormal findings: Secondary | ICD-10-CM

## 2021-03-16 DIAGNOSIS — Z1211 Encounter for screening for malignant neoplasm of colon: Secondary | ICD-10-CM

## 2021-03-16 NOTE — Patient Instructions (Addendum)
Jennifer Clayton , Thank you for taking time to come for your Medicare Wellness Visit. I appreciate your ongoing commitment to your health goals. Please review the following plan we discussed and let me know if I can assist you in the future.   These are the goals we discussed:  Goals       Patient Stated     Maintain Healthy Lifestyle (pt-stated)      Stay active Healthy diet        This is a list of the screening recommended for you and due dates:  Health Maintenance  Topic Date Due   Colon Cancer Screening  01/13/2021   COVID-19 Vaccine (3 - Pfizer risk series) 04/01/2021*   Mammogram  04/28/2022   Tetanus Vaccine  06/24/2027   Pneumonia Vaccine  Completed   Flu Shot  Completed   DEXA scan (bone density measurement)  Completed   Hepatitis C Screening: USPSTF Recommendation to screen - Ages 68-79 yo.  Completed   Zoster (Shingles) Vaccine  Completed   HPV Vaccine  Aged Out  *Topic was postponed. The date shown is not the original due date.    Colonoscopy, Adult A colonoscopy is a procedure to look at the entire large intestine. This procedure is done using a long, thin, flexible tube that has a camera on the end. You may have a colonoscopy: As a part of normal colorectal screening. If you have certain symptoms, such as: A low number of red blood cells in your blood (anemia). Diarrhea that does not go away. Pain in your abdomen. Blood in your stool. A colonoscopy can help screen for and diagnose medical problems, including: Tumors. Extra tissue that grows where mucus forms (polyps). Inflammation. Areas of bleeding. Tell your health care provider about: Any allergies you have. All medicines you are taking, including vitamins, herbs, eye drops, creams, and over-the-counter medicines. Any problems you or family members have had with anesthetic medicines. Any blood disorders you have. Any surgeries you have had. Any medical conditions you have. Any problems you have had  with having bowel movements. Whether you are pregnant or may be pregnant. What are the risks? Generally, this is a safe procedure. However, problems may occur, including: Bleeding. Damage to your intestine. Allergic reactions to medicines given during the procedure. Infection. This is rare. What happens before the procedure? Eating and drinking restrictions Follow instructions from your health care provider about eating or drinking restrictions, which may include: A few days before the procedure: Follow a low-fiber diet. Avoid nuts, seeds, dried fruit, raw fruits, and vegetables. 1-3 days before the procedure: Eat only gelatin dessert or ice pops. Drink only clear liquids, such as water, clear juice, clear broth or bouillon, black coffee or tea, or clear soft drinks or sports drinks. Avoid liquids that contain red or purple dye. The day of the procedure: Do not eat solid foods. You may continue to drink clear liquids until up to 2 hours before the procedure. Do not eat or drink anything starting 2 hours before the procedure, or within the time period that your health care provider recommends. Bowel prep If you were prescribed a bowel prep to take by mouth (orally) to clean out your colon: Take it as told by your health care provider. Starting the day before your procedure, you will need to drink a large amount of liquid medicine. The liquid will cause you to have many bowel movements of loose stool until your stool becomes almost clear or light green.  If your skin or the opening between the buttocks (anus) gets irritated from diarrhea, you may relieve the irritation using: Wipes with medicine in them, such as adult wet wipes with aloe and vitamin E. A product to soothe skin, such as petroleum jelly. If you vomit while drinking the bowel prep: Take a break for up to 60 minutes. Begin the bowel prep again. Call your health care provider if you keep vomiting or you cannot take the bowel  prep without vomiting. To clean out your colon, you may also be given: Laxative medicines. These help you have a bowel movement. Instructions for enema use. An enema is liquid medicine injected into your rectum. Medicines Ask your health care provider about: Changing or stopping your regular medicines or supplements. This is especially important if you are taking iron supplements, diabetes medicines, or blood thinners. Taking medicines such as aspirin and ibuprofen. These medicines can thin your blood. Do not take these medicines unless your health care provider tells you to take them. Taking over-the-counter medicines, vitamins, herbs, and supplements. General instructions Ask your health care provider what steps will be taken to help prevent infection. These may include washing skin with a germ-killing soap. Plan to have someone take you home from the hospital or clinic. What happens during the procedure?  An IV will be inserted into one of your veins. You may be given one or more of the following: A medicine to help you relax (sedative). A medicine to numb the area (local anesthetic). A medicine to make you fall asleep (general anesthetic). This is rarely needed. You will lie on your side with your knees bent. The tube will: Have oil or gel put on it (be lubricated). Be inserted into your anus. Be gently eased through all parts of your large intestine. Air will be sent into your colon to keep it open. This may cause some pressure or cramping. Images will be taken with the camera and will appear on a screen. A small tissue sample may be removed to be looked at under a microscope (biopsy). The tissue may be sent to a lab for testing if any signs of problems are found. If small polyps are found, they may be removed and checked for cancer cells. When the procedure is finished, the tube will be removed. The procedure may vary among health care providers and hospitals. What happens after  the procedure? Your blood pressure, heart rate, breathing rate, and blood oxygen level will be monitored until you leave the hospital or clinic. You may have a small amount of blood in your stool. You may pass gas and have mild cramping or bloating in your abdomen. This is caused by the air that was used to open your colon during the exam. Do not drive for 24 hours after the procedure. It is up to you to get the results of your procedure. Ask your health care provider, or the department that is doing the procedure, when your results will be ready. Summary A colonoscopy is a procedure to look at the entire large intestine. Follow instructions from your health care provider about eating and drinking before the procedure. If you were prescribed an oral bowel prep to clean out your colon, take it as told by your health care provider. During the colonoscopy, a flexible tube with a camera on its end is inserted into the anus and then passed into the other parts of the large intestine. This information is not intended to replace advice given to  you by your health care provider. Make sure you discuss any questions you have with your health care provider. Document Revised: 08/10/2018 Document Reviewed: 08/10/2018 Elsevier Patient Education  [place].

## 2021-03-16 NOTE — Progress Notes (Signed)
Subjective:   Jennifer Clayton is a 66 y.o. female who presents for Medicare Annual (Subsequent) preventive examination.  Review of Systems    No ROS.  Medicare Wellness Virtual Visit.  Visual/audio telehealth visit, UTA vital signs.   See social history for additional risk factors.   Cardiac Risk Factors include: advanced age (>35men, >5 women);hypertension     Objective:    Today's Vitals   03/16/21 1235  Weight: 127 lb (57.6 kg)  Height: 5\' 3"  (1.6 m)   Body mass index is 22.5 kg/m.  Advanced Directives 03/16/2021 10/12/2020 04/13/2020 03/13/2020 10/15/2019 08/13/2019 06/11/2019  Does Patient Have a Medical Advance Directive? Yes No Yes Yes No Yes Yes  Type of Advance Directive - - Public librarian;Living will - [place];Living will Living will  Does patient want to make changes to medical advance directive? - - No - Patient declined No - Patient declined - No - Patient declined No - Patient declined  Copy of Healthcare Power of Attorney in Chart? - - - No - copy requested - No - copy requested -  Would patient like information on creating a medical advance directive? - No - Patient declined - - No - Patient declined - -    Current Medications (verified) Outpatient Encounter Medications as of 03/16/2021  Medication Sig   amLODipine (NORVASC) 5 MG tablet TAKE 1 TABLET BY MOUTH ONCE DAILY   Ascorbic Acid (VITAMIN C PO) Take 1 tablet by mouth daily.   buPROPion (WELLBUTRIN XL) 300 MG 24 hr tablet TAKE 1 TABLET BY MOUTH DAILY   calcium carbonate (OS-CAL - DOSED IN MG OF ELEMENTAL CALCIUM) 1250 (500 Ca) MG tablet Take 1 tablet by mouth daily.   denosumab (PROLIA) 60 MG/ML SOSY injection Inject 60 mg into the skin every 6 (six) months.   fluorouracil (EFUDEX) 5 % cream Apply topically 2 (two) times daily. As directed   letrozole (FEMARA) 2.5 MG tablet TAKE ONE TABLET BY MOUTH EVERY DAY   levothyroxine (SYNTHROID) 100 MCG tablet TAKE 1 TABLET EVERY  DAY ON EMPTY STOMACHWITH A GLASS OF WATER AT LEAST 30-60 MINBEFORE BREAKFAST (Patient taking differently: Take 100 mcg by mouth daily before breakfast.)   Multiple Vitamins-Minerals (MULTIVITAMIN ADULT PO) Take 1 tablet by mouth daily.   mupirocin ointment (BACTROBAN) 2 % Apply to skin qd-bid   rosuvastatin (CRESTOR) 5 MG tablet TAKE 1 TABLET BY MOUTH EVERY EVENING   SUMAtriptan (IMITREX) 50 MG tablet TAKE 1 TABLET ONCE MAY REPEAT IN 2 HOURSIF NECESSARY   zolpidem (AMBIEN) 5 MG tablet TAKE 1 TABLET BY MOUTH AT BEDTIME   [DISCONTINUED] chlorpheniramine-HYDROcodone (TUSSIONEX PENNKINETIC ER) 10-8 MG/5ML SUER Take 5 mLs by mouth at bedtime as needed for cough.   No facility-administered encounter medications on file as of 03/16/2021.    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 Dwyane Luo office   BREAST LUMPECTOMY Right 05/13/2019   positive   BREAST LUMPECTOMY WITH SENTINEL LYMPH NODE BIOPSY Right 05/13/2019   Procedure: BREAST LUMPECTOMY WITH SENTINEL LYMPH NODE BX;  Surgeon: Robert Bellow, MD;  Location: [facility] ORS;  Service: General;  Laterality: Right;   VAGINAL DELIVERY     Family History  Problem Relation Age of Onset   Cancer Mother        lung, non-smoker   Dementia Father         related to traumatic brain injury   Breast cancer Neg Hx    Social History   Socioeconomic History   Marital status: Married    Spouse name: Not on file   Number of children: Not on file   Years of education: Not on file   Highest education level: Not on file  Occupational History   Not on file  Tobacco Use   Smoking status: Never   Smokeless tobacco: Never  Vaping Use   Vaping Use: Never used  Substance and Sexual Activity   Alcohol use: Yes    Alcohol/week: 1.0 standard drink    Types: 1 Glasses of wine per week    Comment: rare   Drug use: Never   Sexual activity: Not on file  Other Topics Concern   Not on file  Social History Narrative   Lives in [place] with husband and son and 4 grandchildren.      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       Never smoked; social alcohol.       Social Determinants of Health   Financial Resource Strain: Low Risk    Difficulty of Paying Living Expenses: Not hard at all  Food Insecurity: No Food Insecurity   Worried About Charity fundraiser in the Last Year: Never true   [place] in the Last Year: Never true  Transportation Needs: No Transportation Needs   Lack of Transportation (Medical): No   Lack of Transportation (Non-Medical): No  Physical Activity: Sufficiently Active   Days of Exercise per Week: 5 days   Minutes of Exercise per Session: 60 min  Stress: No Stress Concern Present   Feeling of Stress : Not at all  Social Connections: Unknown   Frequency of Communication with Friends and Family: More than three times a week   Frequency of Social Gatherings with Friends and Family: More than three times a week   Attends Religious Services: Not on Electrical engineer or Organizations: Not on file   Attends Archivist Meetings: Not on file   Marital Status: Not on file     Tobacco Counseling Counseling given: Not Answered   Clinical Intake:  Pre-visit preparation completed: Yes        Diabetes: No  How often do you need to have someone help you when you read instructions, pamphlets, or other written materials from your doctor or pharmacy?: 1 - Never  Interpreter Needed?: No      Activities of Daily Living In your present state of health, do you have any difficulty performing the following activities: 03/16/2021  Hearing? N  Vision?  N  Difficulty concentrating or making decisions? N  Walking or climbing stairs? N  Dressing or bathing? N  Doing errands, shopping? N  Preparing Food and eating ? N  Using the Toilet? N  In the past six months, have you accidently leaked urine? N  Do you have problems with loss of bowel control? N  Managing your Medications? N  Managing your Finances? N  Housekeeping or managing your Housekeeping? N  Some recent data might be hidden    Patient Care Team: Burnard Hawthorne, FNP as PCP - General (Family Medicine) Theodore Demark, RN as Oncology Nurse Navigator Noreene Filbert, MD as Radiation Oncologist (Radiation Oncology) Bary Castilla Forest Gleason, MD as Consulting Physician (General Surgery) Cammie Sickle, MD as Consulting Physician (Internal Medicine)  Indicate any recent Medical Services you may have received from other than [facility] providers in the past year (date may be approximate).     Assessment:   This is a routine wellness examination for Jennifer Clayton.  Virtual Visit via Telephone Note  I connected with  Jennifer Clayton on 03/16/21 at 12:30 PM EST by telephone and verified that I am speaking with the correct person using two identifiers.  Persons participating in the virtual visit: patient/Nurse Health Advisor   I discussed the limitations, risks, security and privacy concerns of performing an evaluation and management service by telephone and the availability of in person appointments. The  patient expressed understanding and agreed to proceed.  Interactive audio and video telecommunications were attempted between this nurse and patient, however failed, due to patient having technical difficulties OR patient did not have access to video capability.  We continued and completed visit with audio only.  Some vital signs may be absent or patient reported.   Hearing/Vision screen Hearing Screening - Comments:: Patient is able to hear conversational tones without difficulty. No issues reported. Vision Screening - Comments:: Wears corrective lenses when reading     Dietary issues and exercise activities discussed: Current Exercise Habits: Home exercise routine, Type of exercise: strength training/weights;calisthenics;stretching, Time (Minutes): > 60, Frequency (Times/Week): 5, Weekly Exercise (Minutes/Week): 0, Intensity: Mild Healthy diet Good water intake   Goals Addressed               This Visit's Progress     Patient Stated     Maintain Healthy Lifestyle (pt-stated)        Stay active Healthy diet       Depression Screen PHQ 2/9 Scores 03/16/2021 08/26/2020 03/13/2020 11/27/2019 03/05/2019 03/10/2016  PHQ - 2 Score 0 0 0 0 0 0  PHQ- 9 Score - - - 2 - -    Fall Risk Fall Risk  03/16/2021 03/13/2020 03/05/2019 08/29/2018 03/10/2016  Falls in the past year? 0 0 0 0 No  Comment - - - Emmi Telephone Survey: data to providers prior to load -  Number falls in past yr: 0 0 - - -  Injury with Fall? - 0 - - -  Follow up Falls evaluation completed Falls evaluation completed Falls evaluation completed - -    FALL RISK PREVENTION PERTAINING TO THE HOME: Home free of loose throw rugs in walkways, pet beds, electrical cords, etc? Yes  Adequate lighting in your home to reduce risk of falls? Yes   ASSISTIVE DEVICES UTILIZED TO PREVENT FALLS: Life alert? No  Use of a cane, walker or w/c? No   TIMED UP AND GO: Was the test performed? No .   Cognitive Function:  Patient  is alert  and oriented x3.  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       Immunizations Immunization History  Administered Date(s) Administered   Fluad Quad(high Dose 65+) 11/27/2019   Influenza,inj,Quad PF,6+ Mos 10/21/2015   Influenza-Unspecified 12/02/2018, 11/12/2020   PFIZER(Purple Top)SARS-COV-2 Vaccination 04/01/2019, 04/22/2019   PNEUMOCOCCAL CONJUGATE-20 05/27/2020   Tdap 06/23/2017   Zoster Recombinat (Shingrix) 12/20/2017, 03/22/2018   Screening Tests Health Maintenance  Topic Date Due   COLONOSCOPY (Pts 45-23yrs Insurance coverage will need to be confirmed)  01/13/2021   COVID-19 Vaccine (3 - Pfizer risk series) 04/01/2021 (Originally 05/20/2019)   MAMMOGRAM  04/28/2022   TETANUS/TDAP  06/24/2027   Pneumonia Vaccine 61+ Years old  Completed   INFLUENZA VACCINE  Completed   DEXA SCAN  Completed   Hepatitis C Screening  Completed   Zoster Vaccines- Shingrix  Completed   HPV VACCINES  Aged Out   Health Maintenance Health Maintenance Due  Topic Date Due   COLONOSCOPY (Pts 45-55yrs Insurance coverage will need to be confirmed)  01/13/2021    Colorectal cancer screening: Type of screening: Colonoscopy. Completed 2017. Repeat every 5 years. Ordered per consent today.   Lung Cancer Screening: (Low Dose CT Chest recommended if Age 60-80 years, 30 pack-year currently smoking OR have quit w/in 15years.) does not qualify.   Vision Screening: Recommended annual ophthalmology exams for early detection of glaucoma and other disorders of the eye.  Dental Screening: Recommended annual dental exams for proper oral hygiene  Community Resource Referral / Chronic Care Management: CRR required this visit?  No   CCM required this visit?  No      Plan:   Keep all routine maintenance appointments.   I have personally reviewed and noted the following in the patients chart:   Medical and social history Use of alcohol, tobacco or illicit drugs  Current medications  and supplements including opioid prescriptions.  Functional ability and status Nutritional status Physical activity Advanced directives List of other physicians Hospitalizations, surgeries, and ER visits in previous 12 months Vitals Screenings to include cognitive, depression, and falls Referrals and appointments  In addition, I have reviewed and discussed with patient certain preventive protocols, quality metrics, and best practice recommendations. A written personalized care plan for preventive services as well as general preventive health recommendations were provided to patient.     Varney Biles, LPN   [id number]

## 2021-03-23 ENCOUNTER — Telehealth: Payer: Self-pay

## 2021-03-23 NOTE — Telephone Encounter (Signed)
PATIENT GOING WITH LAST PROVIDER WANTS TO BE TAKEN OFF OUR LIST

## 2021-03-29 ENCOUNTER — Other Ambulatory Visit: Payer: Self-pay | Admitting: Family

## 2021-03-29 DIAGNOSIS — G43919 Migraine, unspecified, intractable, without status migrainosus: Secondary | ICD-10-CM

## 2021-04-13 ENCOUNTER — Inpatient Hospital Stay: Payer: PPO

## 2021-04-13 ENCOUNTER — Other Ambulatory Visit: Payer: Self-pay

## 2021-04-13 ENCOUNTER — Encounter: Payer: Self-pay | Admitting: Internal Medicine

## 2021-04-13 ENCOUNTER — Inpatient Hospital Stay: Payer: PPO | Attending: Internal Medicine

## 2021-04-13 ENCOUNTER — Inpatient Hospital Stay (HOSPITAL_BASED_OUTPATIENT_CLINIC_OR_DEPARTMENT_OTHER): Payer: PPO | Admitting: Internal Medicine

## 2021-04-13 VITALS — BP 114/71 | HR 73 | Temp 99.0°F | Resp 16 | Wt 132.8 lb

## 2021-04-13 DIAGNOSIS — Z79811 Long term (current) use of aromatase inhibitors: Secondary | ICD-10-CM | POA: Insufficient documentation

## 2021-04-13 DIAGNOSIS — C50411 Malignant neoplasm of upper-outer quadrant of right female breast: Secondary | ICD-10-CM | POA: Insufficient documentation

## 2021-04-13 DIAGNOSIS — M2550 Pain in unspecified joint: Secondary | ICD-10-CM | POA: Diagnosis not present

## 2021-04-13 DIAGNOSIS — I10 Essential (primary) hypertension: Secondary | ICD-10-CM | POA: Diagnosis not present

## 2021-04-13 DIAGNOSIS — M810 Age-related osteoporosis without current pathological fracture: Secondary | ICD-10-CM | POA: Diagnosis not present

## 2021-04-13 DIAGNOSIS — Z801 Family history of malignant neoplasm of trachea, bronchus and lung: Secondary | ICD-10-CM | POA: Diagnosis not present

## 2021-04-13 DIAGNOSIS — Z170 Estrogen receptor positive status [ER+]: Secondary | ICD-10-CM | POA: Insufficient documentation

## 2021-04-13 DIAGNOSIS — R232 Flushing: Secondary | ICD-10-CM | POA: Diagnosis not present

## 2021-04-13 DIAGNOSIS — G8929 Other chronic pain: Secondary | ICD-10-CM | POA: Insufficient documentation

## 2021-04-13 DIAGNOSIS — Z79899 Other long term (current) drug therapy: Secondary | ICD-10-CM | POA: Diagnosis not present

## 2021-04-13 DIAGNOSIS — E039 Hypothyroidism, unspecified: Secondary | ICD-10-CM | POA: Insufficient documentation

## 2021-04-13 DIAGNOSIS — Z923 Personal history of irradiation: Secondary | ICD-10-CM | POA: Insufficient documentation

## 2021-04-13 LAB — BASIC METABOLIC PANEL
Anion gap: 6 (ref 5–15)
BUN: 11 mg/dL (ref 8–23)
CO2: 29 mmol/L (ref 22–32)
Calcium: 8.6 mg/dL — ABNORMAL LOW (ref 8.9–10.3)
Chloride: 99 mmol/L (ref 98–111)
Creatinine, Ser: 0.74 mg/dL (ref 0.44–1.00)
GFR, Estimated: >60 mL/min (ref >60)
Glucose, Bld: 110 mg/dL — ABNORMAL HIGH (ref 70–99)
Potassium: 4.1 mmol/L (ref 3.5–5.1)
Sodium: 134 mmol/L — ABNORMAL LOW (ref 135–145)

## 2021-04-13 MED ORDER — DENOSUMAB 60 MG/ML ~~LOC~~ SOSY
60.0000 mg | PREFILLED_SYRINGE | Freq: Once | SUBCUTANEOUS | Status: AC
  Administered 2021-04-13: 60 mg via SUBCUTANEOUS
  Filled 2021-04-13: qty 1

## 2021-04-13 NOTE — Progress Notes (Signed)
[facility] [place] ?CONSULT NOTE ? ?Patient Care Team: ?Burnard Hawthorne, FNP as PCP - General (Family Medicine) ?Theodore Demark, RN as Oncology Nurse Navigator ?Noreene Filbert, MD as Radiation Oncologist (Radiation Oncology) ?Robert Bellow, MD as Consulting Physician (General Surgery) ?Cammie Sickle, MD as Consulting Physician (Internal Medicine) ? ?CHIEF COMPLAINTS/PURPOSE OF CONSULTATION: Breast cancer ? ?#  ?Oncology History Overview Note  ?# MARCH 2021-right breast 11 o'clock position-s/p lumpectomy; SLNBx [Dr. Tollie Pizza ]pT1c (15 mm) p N0-grade-1; ER/PR positive HER-2 negative; Oncotype low risk [risk of recurrence is 5%]-no chemotherapy; s/p RT [08/12/2019]- Oncotyoe- 5% risk of recurrence. No chemo.  ? ?#Mid July 2021-letrozole ? ?#Osteoporosis 2021-Prolia ? ?# SURVIVORSHIP:  ? ?# GENETICS:  ? ?DIAGNOSIS: Right breast cancer ? ?STAGE:   1      ;  GOALS: Cure ? ?CURRENT/MOST RECENT THERAPY : Letrozole ? ?  ?Carcinoma of upper-outer quadrant of right breast in female, estrogen receptor positive ([place])  ?05/22/2019 Initial Diagnosis  ? Carcinoma of upper-outer quadrant of right breast in female, estrogen receptor positive ([place]) ?  ? ? ? ?HISTORY OF PRESENTING ILLNESS: Alone.  Ambulating independently. ?Jennifer Clayton 66 y.o.  female stage I breast cancer ER/PR positive Her-2 negative on letrozole is here for follow-up. ? ?Patient denies new problems/concerns today.   ? ?Mild hot flashes.  Mild chronic joint pains.  Not any worse. ? ?No shortness of breath or cough. ? ? ?Review of Systems  ?Constitutional:  Negative for chills, diaphoresis, fever, malaise/fatigue and weight loss.  ?HENT:  Negative for nosebleeds and sore throat.   ?Eyes:  Negative for double vision.  ?Respiratory:  Negative for cough, hemoptysis, sputum production, shortness of breath and wheezing.   ?Cardiovascular:  Negative for chest pain, palpitations, orthopnea and leg swelling.  ?Gastrointestinal:  Negative for  abdominal pain, blood in stool, constipation, diarrhea, heartburn, melena, nausea and vomiting.  ?Genitourinary:  Negative for dysuria, frequency and urgency.  ?Musculoskeletal:  Positive for joint pain. Negative for back pain.  ?Skin: Negative.  Negative for itching and rash.  ?Neurological:  Negative for dizziness, tingling, focal weakness, weakness and headaches.  ?Endo/Heme/Allergies:  Does not bruise/bleed easily.  ?Psychiatric/Behavioral:  Negative for depression. The patient is not nervous/anxious and does not have insomnia.    ? ?MEDICAL HISTORY:  ?Past Medical History:  ?Diagnosis Date  ? Breast cancer ([place]) 04/2019  ? right breast  ? Cancer ([place])   ? breast cancer  ? H/O radioactive iodine thyroid ablation   ? Headache   ? MIGRAINES  ? History of actinic keratosis 05/30/2018  ? right posterior shoulder  ? History of actinic keratosis 03/31/2020  ? right forearm  ? History of squamous cell carcinoma 10/19/2012  ? right anterior lower leg / excised 10/31/2012  ? Hypertension   ? Hypothyroidism   ? Personal history of radiation therapy   ? ? ?SURGICAL HISTORY: ?Past Surgical History:  ?Procedure Laterality Date  ? ABDOMINAL HYSTERECTOMY    ? Age 5, for bleeding and cyst; NO ovaries  ? BREAST BIOPSY Right 04/25/2019  ? positive/ done in Dr. Dwyane Luo office  ? BREAST LUMPECTOMY Right 05/13/2019  ? positive  ? BREAST LUMPECTOMY WITH SENTINEL LYMPH NODE BIOPSY Right 05/13/2019  ? Procedure: BREAST LUMPECTOMY WITH SENTINEL LYMPH NODE BX;  Surgeon: Robert Bellow, MD;  Location: [facility] ORS;  Service: General;  Laterality: Right;  ? VAGINAL DELIVERY    ? ? ?SOCIAL HISTORY: ?Social History  ? ?Socioeconomic History  ? Marital  status: Married  ?  Spouse name: Not on file  ? Number of children: Not on file  ? Years of education: Not on file  ? Highest education level: Not on file  ?Occupational History  ? Not on file  ?Tobacco Use  ? Smoking status: Never  ? Smokeless tobacco: Never  ?Vaping Use  ? Vaping Use: Never  used  ?Substance and Sexual Activity  ? Alcohol use: Yes  ?  Alcohol/week: 1.0 standard drink  ?  Types: 1 Glasses of wine per week  ?  Comment: rare  ? Drug use: Never  ? Sexual activity: Not on file  ?Other Topics Concern  ? Not on file  ?Social History Narrative  ? Lives in [place] with husband and son and 4 grandchildren.  ?   ? Work - Full time in [employer]  ?   ? Diet - regular diet  ?   ? Exercise - typically daily at [facility] for 45-67min, lifting some weight, ellipitical, walks.   ?   ? Never smoked; social alcohol.   ?   ? ?Social Determinants of Health  ? ?Financial Resource Strain: Low Risk   ? Difficulty of Paying Living Expenses: Not hard at all  ?Food Insecurity: No Food Insecurity  ? Worried About Charity fundraiser in the Last Year: Never true  ? Ran Out of Food in the Last Year: Never true  ?Transportation Needs: No Transportation Needs  ? Lack of Transportation (Medical): No  ? Lack of Transportation (Non-Medical): No  ?Physical Activity: Sufficiently Active  ? Days of Exercise per Week: 5 days  ? Minutes of Exercise per Session: 60 min  ?Stress: No Stress Concern Present  ? Feeling of Stress : Not at all  ?Social Connections: Unknown  ? Frequency of Communication with Friends and Family: More than three times a week  ? Frequency of Social Gatherings with Friends and Family: More than three times a week  ? Attends Religious Services: Not on file  ? Active Member of Clubs or Organizations: Not on file  ? Attends Archivist Meetings: Not on file  ? Marital Status: Not on file  ?Intimate Partner Violence: Not At Risk  ? Fear of Current or Ex-Partner: No  ? Emotionally Abused: No  ? Physically Abused: No  ? Sexually Abused: No  ? ? ?FAMILY HISTORY: ?Family History  ?Problem Relation Age of Onset  ? Cancer Mother   ?     lung, non-smoker  ? Dementia Father   ?     related to traumatic brain injury  ? Breast cancer Neg Hx   ? ? ?ALLERGIES:  is allergic to zomepirac, amoxicillin-pot  clavulanate, and other. ? ?MEDICATIONS:  ?Current Outpatient Medications  ?Medication Sig Dispense Refill  ? amLODipine (NORVASC) 5 MG tablet TAKE 1 TABLET BY MOUTH ONCE DAILY 90 tablet 3  ? Ascorbic Acid (VITAMIN C PO) Take 1 tablet by mouth daily.    ? buPROPion (WELLBUTRIN XL) 300 MG 24 hr tablet TAKE 1 TABLET BY MOUTH DAILY 90 tablet 1  ? calcium carbonate (OS-CAL - DOSED IN MG OF ELEMENTAL CALCIUM) 1250 (500 Ca) MG tablet Take 1 tablet by mouth daily.    ? denosumab (PROLIA) 60 MG/ML SOSY injection Inject 60 mg into the skin every 6 (six) months.    ? fluorouracil (EFUDEX) 5 % cream Apply topically 2 (two) times daily. As directed 15 g 0  ? letrozole (FEMARA) 2.5 MG tablet TAKE ONE  TABLET BY MOUTH EVERY DAY 90 tablet 3  ? levothyroxine (SYNTHROID) 100 MCG tablet TAKE 1 TABLET EVERY DAY ON EMPTY STOMACHWITH A GLASS OF WATER AT LEAST 30-60 MINBEFORE BREAKFAST (Patient taking differently: Take 100 mcg by mouth daily before breakfast.) 90 tablet 0  ? Multiple Vitamins-Minerals (MULTIVITAMIN ADULT PO) Take 1 tablet by mouth daily.    ? mupirocin ointment (BACTROBAN) 2 % Apply to skin qd-bid 22 g 1  ? rosuvastatin (CRESTOR) 5 MG tablet TAKE 1 TABLET BY MOUTH EVERY EVENING 90 tablet 3  ? SUMAtriptan (IMITREX) 50 MG tablet TAKE 1 TABLET ONCE MAY REPEAT IN 2 HOURSIF NECESSARY 15 tablet 2  ? zolpidem (AMBIEN) 5 MG tablet TAKE 1 TABLET BY MOUTH AT BEDTIME 30 tablet 2  ? ?No current facility-administered medications for this visit.  ? ? ?  ?. ? ?PHYSICAL EXAMINATION: ?ECOG PERFORMANCE STATUS: 0 - Asymptomatic ? ?Vitals:  ? 04/13/21 1000  ?BP: 114/71  ?Pulse: 73  ?Resp: 16  ?Temp: 99 ?F (37.2 ?C)  ? ? ?Filed Weights  ? 04/13/21 1000  ?Weight: 132 lb 12.8 oz (60.2 kg)  ? ? ? ?Physical Exam ?HENT:  ?   Head: Normocephalic and atraumatic.  ?   Mouth/Throat:  ?   Pharynx: No oropharyngeal exudate.  ?Eyes:  ?   Pupils: Pupils are equal, round, and reactive to light.  ?Cardiovascular:  ?   Rate and Rhythm: Normal rate and regular  rhythm.  ?Pulmonary:  ?   Effort: Pulmonary effort is normal. No respiratory distress.  ?   Breath sounds: Normal breath sounds. No wheezing.  ?Abdominal:  ?   General: Bowel sounds are normal. There is no distensi

## 2021-04-13 NOTE — Progress Notes (Signed)
Patient denies new problems/concerns today.   °

## 2021-04-13 NOTE — Assessment & Plan Note (Addendum)
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  On adjuvant Letrozole [until summer 2026]. STABLE; Tolerating well no major side effects noted except for mild joint pains/hot flashes. Mammo-March 2023 [Dr.Byrnnett]- pending.   ? ?# joint pains- STABLE;- G-1 sec to letrozole.  ? ?# Hot flashes-STABLE; G-1 sec to letrozole.  ? ?# OSTEOPOROSIS [BMD03/-2021]-T score -3; continue calcium plus vitamin D plus exercise program. On Prolia;   Mild  hypocalcemia.  Today calcium 8.5;continue Vit D 1000/day.  Proceed with Prolia today.will repeat BMD in 6 months.  ? ?# DISPOSITION: ?# Prolia today ?# Follow up in 6 months- MD; labs- BMP;prolia SQ; BMD prior-  Dr.B ?

## 2021-04-21 ENCOUNTER — Other Ambulatory Visit: Payer: Self-pay | Admitting: Family

## 2021-04-28 ENCOUNTER — Other Ambulatory Visit: Payer: Self-pay

## 2021-04-28 ENCOUNTER — Ambulatory Visit
Admission: RE | Admit: 2021-04-28 | Discharge: 2021-04-28 | Disposition: A | Discharge status: Home / Self Care | Payer: PPO | Source: Ambulatory Visit | Attending: General Surgery | Admitting: General Surgery

## 2021-04-28 DIAGNOSIS — C50411 Malignant neoplasm of upper-outer quadrant of right female breast: Secondary | ICD-10-CM | POA: Diagnosis not present

## 2021-04-28 DIAGNOSIS — R928 Other abnormal and inconclusive findings on diagnostic imaging of breast: Secondary | ICD-10-CM | POA: Insufficient documentation

## 2021-04-28 DIAGNOSIS — Z1239 Encounter for other screening for malignant neoplasm of breast: Secondary | ICD-10-CM | POA: Diagnosis not present

## 2021-04-28 DIAGNOSIS — R922 Inconclusive mammogram: Secondary | ICD-10-CM | POA: Diagnosis not present

## 2021-05-20 DIAGNOSIS — Z853 Personal history of malignant neoplasm of breast: Secondary | ICD-10-CM | POA: Diagnosis not present

## 2021-05-29 ENCOUNTER — Other Ambulatory Visit: Payer: Self-pay | Admitting: Family

## 2021-06-03 ENCOUNTER — Other Ambulatory Visit: Payer: Self-pay | Admitting: Family

## 2021-06-03 DIAGNOSIS — R635 Abnormal weight gain: Secondary | ICD-10-CM

## 2021-06-04 ENCOUNTER — Other Ambulatory Visit: Payer: Self-pay | Admitting: Family

## 2021-06-04 NOTE — Telephone Encounter (Signed)
LMTCB to make sure patient has enough to last through Monday.  ?

## 2021-06-17 ENCOUNTER — Other Ambulatory Visit: Payer: Self-pay | Admitting: Internal Medicine

## 2021-07-27 ENCOUNTER — Telehealth: Payer: Self-pay | Admitting: Family

## 2021-07-27 ENCOUNTER — Other Ambulatory Visit: Payer: Self-pay | Admitting: Family

## 2021-07-27 DIAGNOSIS — G4700 Insomnia, unspecified: Secondary | ICD-10-CM

## 2021-07-27 NOTE — Telephone Encounter (Signed)
Call pt She was last seen in 12/2020.  To continue writing prescription for Ambien, she needs to be seen every 3 months as a controlled substance.  Please schedule appointment.  I declined refill until she schedules appointment

## 2021-07-28 MED ORDER — ZOLPIDEM TARTRATE 5 MG PO TABS: 5.0000 mg | ORAL_TABLET | Freq: Every day | ORAL | 2 refills | Status: DC

## 2021-07-28 NOTE — Telephone Encounter (Signed)
I have refilled ambien I looked up patient on [place] Controlled Substances Reporting System PMP AWARE and saw no activity that raised concern of inappropriate use.

## 2021-08-06 ENCOUNTER — Encounter: Payer: Self-pay | Admitting: Family

## 2021-08-06 ENCOUNTER — Ambulatory Visit (INDEPENDENT_AMBULATORY_CARE_PROVIDER_SITE_OTHER): Payer: PPO | Admitting: Family

## 2021-08-06 VITALS — BP 120/78 | HR 74 | Temp 98.5°F | Ht 63.0 in | Wt 130.8 lb

## 2021-08-06 DIAGNOSIS — R051 Acute cough: Secondary | ICD-10-CM

## 2021-08-06 DIAGNOSIS — C50411 Malignant neoplasm of upper-outer quadrant of right female breast: Secondary | ICD-10-CM | POA: Diagnosis not present

## 2021-08-06 DIAGNOSIS — G4700 Insomnia, unspecified: Secondary | ICD-10-CM

## 2021-08-06 DIAGNOSIS — I10 Essential (primary) hypertension: Secondary | ICD-10-CM

## 2021-08-06 DIAGNOSIS — Z170 Estrogen receptor positive status [ER+]: Secondary | ICD-10-CM

## 2021-08-06 DIAGNOSIS — Z1211 Encounter for screening for malignant neoplasm of colon: Secondary | ICD-10-CM

## 2021-08-06 DIAGNOSIS — R059 Cough, unspecified: Secondary | ICD-10-CM | POA: Insufficient documentation

## 2021-08-06 MED ORDER — AZITHROMYCIN 250 MG PO TABS: ORAL_TABLET | ORAL | 0 refills | Status: AC

## 2021-08-06 NOTE — Assessment & Plan Note (Signed)
Chronic, stable. Continue amlodipine 5mg 

## 2021-08-06 NOTE — Progress Notes (Signed)
Subjective:    Patient ID: Jennifer Clayton, female    DOB: 1955-08-05, 66 y.o.   MRN: [id number]  CC: Jennifer Clayton is a 66 y.o. female who presents today for follow up.   HPI: Complains of productive cough x 3 weeks, slightly worse. Started after covid 4 months ago more noticeable the past 3 weeks.  Somewhat worse during allergy season.  Cough is random throughout the day and is having coughing spells. Cough worse with physical activity however doesn't prohibit her from doing exercise.  No cp, fever, HA, wheezing, sob, sore throat, leg swelling, pulsatile tinnitus, epigastric burning, hearing loss, vertigo.   Endorses hoarseness, PND.   Tried dayquil, nasal saline without relief   Insomnia-compliant with Ambien 5 mg qpm prn. She uses half tablet.   hypertension-compliant with amlodipine 5 mg Hyperlipidemia-compliant with rosuvastatin 5 mg   h/o  right breast cancer.She follows with Dr Bary Castilla, Dr Peyton Najjar.  Due Bilateral diagnostic mammogram HISTORY:  Past Medical History:  Diagnosis Date   Breast cancer ([place]) 04/2019   right breast   Cancer ([place])    breast cancer   H/O radioactive iodine thyroid ablation    Headache    MIGRAINES   History of actinic keratosis 05/30/2018   right posterior shoulder   History of actinic keratosis 03/31/2020   right forearm   History of squamous cell carcinoma 10/19/2012   right anterior lower leg / excised 10/31/2012   Hypertension    Hypothyroidism    Personal history of radiation therapy    Past Surgical History:  Procedure Laterality Date   ABDOMINAL HYSTERECTOMY     Age 29, for bleeding and cyst; NO ovaries   BREAST BIOPSY Right 04/25/2019   positive/ done in Dr. Dwyane Luo office   BREAST LUMPECTOMY Right 05/13/2019   positive   BREAST LUMPECTOMY WITH SENTINEL LYMPH NODE BIOPSY Right 05/13/2019   Procedure: BREAST LUMPECTOMY WITH SENTINEL LYMPH NODE BX;  Surgeon: Robert Bellow, MD;  Location: [facility] ORS;  Service:  General;  Laterality: Right;   VAGINAL DELIVERY     Family History  Problem Relation Age of Onset   Cancer Mother        lung, non-smoker   Dementia Father        related to traumatic brain injury   Breast cancer Neg Hx     Allergies: Zomepirac, Amoxicillin-pot clavulanate, and Other Current Outpatient Medications on File Prior to Visit  Medication Sig Dispense Refill   amLODipine (NORVASC) 5 MG tablet TAKE 1 TABLET BY MOUTH ONCE DAILY 90 tablet 3   Ascorbic Acid (VITAMIN C PO) Take 1 tablet by mouth daily.     buPROPion (WELLBUTRIN XL) 300 MG 24 hr tablet TAKE 1 TABLET BY MOUTH DAILY 90 tablet 1   calcium carbonate (OS-CAL - DOSED IN MG OF ELEMENTAL CALCIUM) 1250 (500 Ca) MG tablet Take 1 tablet by mouth daily.     denosumab (PROLIA) 60 MG/ML SOSY injection Inject 60 mg into the skin every 6 (six) months.     fluorouracil (EFUDEX) 5 % cream Apply topically 2 (two) times daily. As directed 15 g 0   letrozole (FEMARA) 2.5 MG tablet TAKE ONE TABLET BY MOUTH EVERY DAY 90 tablet 3   levothyroxine (SYNTHROID) 100 MCG tablet TAKE 1 TABLET EVERY DAY ON EMPTY STOMACHWITH A GLASS OF WATER AT LEAST 30-60 MINBEFORE BREAKFAST (Patient taking differently: Take 100 mcg by mouth daily before breakfast.) 90 tablet 0   Multiple Vitamins-Minerals (MULTIVITAMIN ADULT PO)  Take 1 tablet by mouth daily.     mupirocin ointment (BACTROBAN) 2 % Apply to skin qd-bid 22 g 1   rosuvastatin (CRESTOR) 5 MG tablet TAKE 1 TABLET BY MOUTH EVERY EVENING 90 tablet 3   SUMAtriptan (IMITREX) 50 MG tablet TAKE 1 TABLET ONCE MAY REPEAT IN 2 HOURSIF NECESSARY 15 tablet 2   zolpidem (AMBIEN) 5 MG tablet Take 1 tablet (5 mg total) by mouth at bedtime. 30 tablet 2   No current facility-administered medications on file prior to visit.    Social History   Tobacco Use   Smoking status: Never   Smokeless tobacco: Never  Vaping Use   Vaping Use: Never used  Substance Use Topics   Alcohol use: Yes    Alcohol/week: 1.0  standard drink of alcohol    Types: 1 Glasses of wine per week    Comment: rare   Drug use: Never    Review of Systems  Constitutional:  Negative for chills and fever.  HENT:  Positive for congestion. Negative for ear pain and sinus pressure.   Respiratory:  Positive for cough. Negative for shortness of breath and wheezing.   Cardiovascular:  Negative for chest pain, palpitations and leg swelling.  Gastrointestinal:  Negative for nausea and vomiting.  Neurological:  Negative for headaches.      Objective:    BP 120/78 (BP Location: Left Arm, Patient Position: Sitting, Cuff Size: Normal)   Pulse 74   Temp 98.5 F (36.9 C) (Oral)   Ht '5\' 3"'$  (1.6 m)   Wt 130 lb 12.8 oz (59.3 kg)   SpO2 97%   BMI 23.17 kg/m  BP Readings from Last 3 Encounters:  08/06/21 120/78  04/13/21 114/71  10/12/20 (!) (P) 150/79   Wt Readings from Last 3 Encounters:  08/06/21 130 lb 12.8 oz (59.3 kg)  04/13/21 132 lb 12.8 oz (60.2 kg)  03/16/21 127 lb (57.6 kg)    Physical Exam Vitals reviewed.  Constitutional:      Appearance: She is well-developed.  HENT:     Head: Normocephalic and atraumatic.     Right Ear: Hearing, tympanic membrane, ear canal and external ear normal. No decreased hearing noted. No drainage, swelling or tenderness. No middle ear effusion. No foreign body. Tympanic membrane is not erythematous or bulging.     Left Ear: Hearing, tympanic membrane, ear canal and external ear normal. No decreased hearing noted. No drainage, swelling or tenderness.  No middle ear effusion. There is impacted cerumen. No foreign body. Tympanic membrane is not erythematous or bulging.     Nose: Nose normal. No rhinorrhea.     Right Sinus: No maxillary sinus tenderness or frontal sinus tenderness.     Left Sinus: No maxillary sinus tenderness or frontal sinus tenderness.     Mouth/Throat:     Pharynx: Uvula midline. No oropharyngeal exudate or posterior oropharyngeal erythema.     Tonsils: No tonsillar  abscesses.  Eyes:     Conjunctiva/sclera: Conjunctivae normal.  Cardiovascular:     Rate and Rhythm: Regular rhythm.     Pulses: Normal pulses.     Heart sounds: Normal heart sounds.  Pulmonary:     Effort: Pulmonary effort is normal.     Breath sounds: Normal breath sounds. No wheezing, rhonchi or rales.  Lymphadenopathy:     Head:     Right side of head: No submental, submandibular, tonsillar, preauricular, posterior auricular or occipital adenopathy.     Left side of head: No submental,  submandibular, tonsillar, preauricular, posterior auricular or occipital adenopathy.     Cervical: No cervical adenopathy.  Skin:    General: Skin is warm and dry.  Neurological:     Mental Status: She is alert.  Psychiatric:        Speech: Speech normal.        Behavior: Behavior normal.        Thought Content: Thought content normal.        Assessment & Plan:   Problem List Items Addressed This Visit       Cardiovascular and Mediastinum   HTN (hypertension)    Chronic, stable.  Continue amlodipine 5 mg        Other   Carcinoma of upper-outer quadrant of right breast in female, estrogen receptor positive ([place])    Continues to follow with Dr. Peyton Najjar, Dr Burlene Arnt.  She will be due bilateral diagnostic mammogram 04/2022      Relevant Medications   azithromycin (ZITHROMAX) 250 MG tablet   Cough - Primary    No acute respiratory distress. She is well appearing today.  Suspect bacterial URI based on duration of symptoms or post covid sequelae. No adventitious lung sounds.  Trial of azithromycin and flonase.  If symptoms not completely resolved, plan order chest x-ray likely trial Symbicort and/or prednisone. Of note: she will return for left ear cerumen impaction during nurse visit      Relevant Medications   azithromycin (ZITHROMAX) 250 MG tablet   Insomnia    Chronic, stable.  Continue Ambien 5 mg      Other Visit Diagnoses     Screening for colon cancer       Relevant  Orders   Ambulatory referral to Gastroenterology        I am having Alashia I. Copher "Debbie" start on azithromycin. I am also having her maintain her Multiple Vitamins-Minerals (MULTIVITAMIN ADULT PO), levothyroxine, Ascorbic Acid (VITAMIN C PO), calcium carbonate, mupirocin ointment, denosumab, rosuvastatin, fluorouracil, amLODipine, SUMAtriptan, buPROPion, letrozole, and zolpidem.   Meds ordered this encounter  Medications   azithromycin (ZITHROMAX) 250 MG tablet    Sig: Take 2 tablets on day 1, then 1 tablet daily on days 2 through 5    Dispense:  6 tablet    Refill:  0    Order Specific Question:   Supervising Provider    Answer:   Crecencio Mc [[id number]]    Return precautions given.   Risks, benefits, and alternatives of the medications and treatment plan prescribed today were discussed, and patient expressed understanding.   Education regarding symptom management and diagnosis given to patient on AVS.  Continue to follow with Burnard Hawthorne, FNP for routine health maintenance.   Lyn Henri Blayney and I agreed with plan.   Mable Paris, FNP

## 2021-08-06 NOTE — Patient Instructions (Addendum)
Referral for colonoscopy Let us know if you dont hear back within a week in regards to an appointment being scheduled.   Start flonase Start azithromycin Ensure to take probiotics while on antibiotics and also for 2 weeks after completion. This can either be by eating yogurt daily or taking a probiotic supplement over the counter such as Culturelle.It is important to re-colonize the gut with good bacteria and also to prevent any diarrheal infections associated with antibiotic use.    Please let me know if cough doesn't completely resolve

## 2021-08-06 NOTE — Assessment & Plan Note (Signed)
Chronic, stable.  Continue Ambien 5 mg 

## 2021-08-06 NOTE — Assessment & Plan Note (Addendum)
No acute respiratory distress. She is well appearing today.  Suspect bacterial URI based on duration of symptoms or post covid sequelae. No adventitious lung sounds.  Trial of azithromycin and flonase.  If symptoms not completely resolved, plan order chest x-ray likely trial Symbicort and/or prednisone. Of note: she will return for left ear cerumen impaction during nurse visit

## 2021-08-06 NOTE — Assessment & Plan Note (Signed)
Continues to follow with Dr. Peyton Najjar, Dr Burlene Arnt.  She will be due bilateral diagnostic mammogram 04/2022

## 2021-08-08 ENCOUNTER — Encounter: Payer: Self-pay | Admitting: Family

## 2021-08-09 ENCOUNTER — Ambulatory Visit: Payer: PPO

## 2021-08-13 ENCOUNTER — Ambulatory Visit: Payer: PPO

## 2021-08-13 NOTE — Progress Notes (Signed)
Ceruminosis is noted in Left ear. Wax is removed by elephant ear  and it was successfully removed.Patient was okay after removal of wax.  Charlii Yost,cma

## 2021-08-16 ENCOUNTER — Other Ambulatory Visit: Payer: Self-pay

## 2021-08-16 ENCOUNTER — Telehealth: Payer: Self-pay

## 2021-08-16 DIAGNOSIS — Z1211 Encounter for screening for malignant neoplasm of colon: Secondary | ICD-10-CM

## 2021-08-16 NOTE — Telephone Encounter (Signed)
Gastroenterology Pre-Procedure Review  Request Date: 12/16/21 Requesting Physician: Dr. Allen Norris  PATIENT REVIEW QUESTIONS: The patient responded to the following health history questions as indicated:    1. Are you having any GI issues? no 2. Do you have a personal history of Polyps? yes (did not see colonoscopy report in Epic. Patient stated that it was some years ago) 3. Do you have a family history of Colon Cancer or Polyps? no 4. Diabetes Mellitus? no 5. Joint replacements in the past 12 months?no 6. Major health problems in the past 3 months?no 7. Any artificial heart valves, MVP, or defibrillator?no    MEDICATIONS & ALLERGIES:    Patient reports the following regarding taking any anticoagulation/antiplatelet therapy:   Plavix, Coumadin, Eliquis, Xarelto, Lovenox, Pradaxa, Brilinta, or Effient? no Aspirin? no  Patient confirms/reports the following medications:  Current Outpatient Medications  Medication Sig Dispense Refill   amLODipine (NORVASC) 5 MG tablet TAKE 1 TABLET BY MOUTH ONCE DAILY 90 tablet 3   Ascorbic Acid (VITAMIN C PO) Take 1 tablet by mouth daily.     buPROPion (WELLBUTRIN XL) 300 MG 24 hr tablet TAKE 1 TABLET BY MOUTH DAILY 90 tablet 1   calcium carbonate (OS-CAL - DOSED IN MG OF ELEMENTAL CALCIUM) 1250 (500 Ca) MG tablet Take 1 tablet by mouth daily.     denosumab (PROLIA) 60 MG/ML SOSY injection Inject 60 mg into the skin every 6 (six) months.     fluorouracil (EFUDEX) 5 % cream Apply topically 2 (two) times daily. As directed 15 g 0   letrozole (FEMARA) 2.5 MG tablet TAKE ONE TABLET BY MOUTH EVERY DAY 90 tablet 3   levothyroxine (SYNTHROID) 100 MCG tablet TAKE 1 TABLET EVERY DAY ON EMPTY STOMACHWITH A GLASS OF WATER AT LEAST 30-60 MINBEFORE BREAKFAST (Patient taking differently: Take 100 mcg by mouth daily before breakfast.) 90 tablet 0   Multiple Vitamins-Minerals (MULTIVITAMIN ADULT PO) Take 1 tablet by mouth daily.     mupirocin ointment (BACTROBAN) 2 % Apply  to skin qd-bid 22 g 1   rosuvastatin (CRESTOR) 5 MG tablet TAKE 1 TABLET BY MOUTH EVERY EVENING 90 tablet 3   SUMAtriptan (IMITREX) 50 MG tablet TAKE 1 TABLET ONCE MAY REPEAT IN 2 HOURSIF NECESSARY 15 tablet 2   zolpidem (AMBIEN) 5 MG tablet Take 1 tablet (5 mg total) by mouth at bedtime. 30 tablet 2   No current facility-administered medications for this visit.    Patient confirms/reports the following allergies:  Allergies  Allergen Reactions   Zomepirac Anaphylaxis    Zomax - reaction over 20 years ago   Amoxicillin-Pot Clavulanate Other (See Comments)    HA   Other Other (See Comments)    Intolerant of multiple food and fruits    No orders of the defined types were placed in this encounter.   AUTHORIZATION INFORMATION Primary Insurance: 1D#: Group #:  Secondary Insurance: 1D#: Group #:  SCHEDULE INFORMATION: Date: 12/16/21 Time: Location: [facility]

## 2021-08-26 DIAGNOSIS — E890 Postprocedural hypothyroidism: Secondary | ICD-10-CM | POA: Diagnosis not present

## 2021-09-30 IMAGING — MG MM DIGITAL DIAGNOSTIC UNILAT*R* W/ TOMO W/ CAD
8 series · 8 of 24 positions shown · non-contrast
Comparison: Previous exam(s).

CLINICAL DATA: 64-year-old female presenting as a recall from
screening for possible right breast distortion.

EXAM:
DIGITAL DIAGNOSTIC RIGHT MAMMOGRAM WITH TOMO
ULTRASOUND RIGHT BREAST

[R CC synth-2D (1 of 2)]
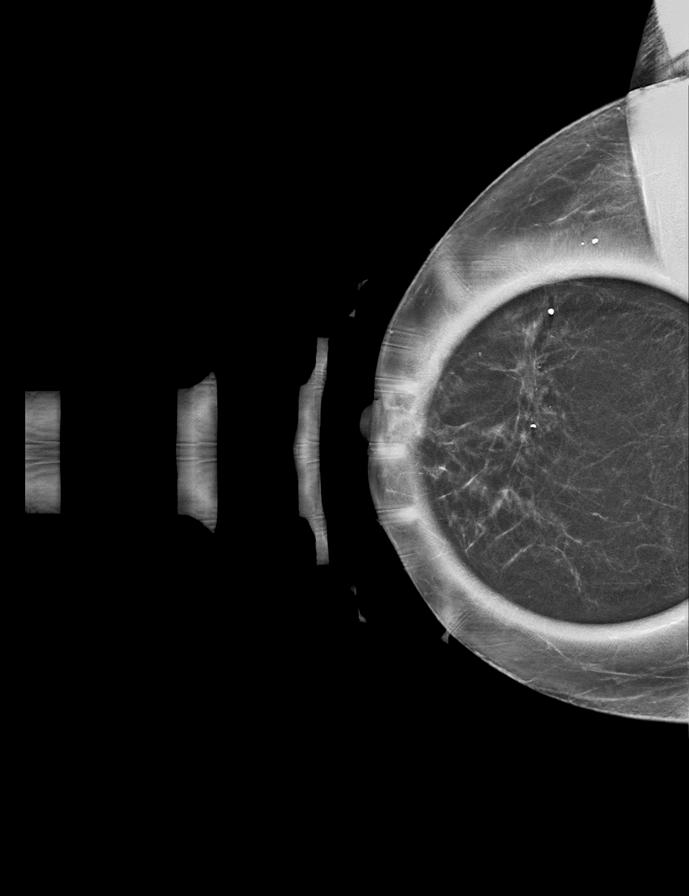

[R CC synth-2D (2 of 2)]
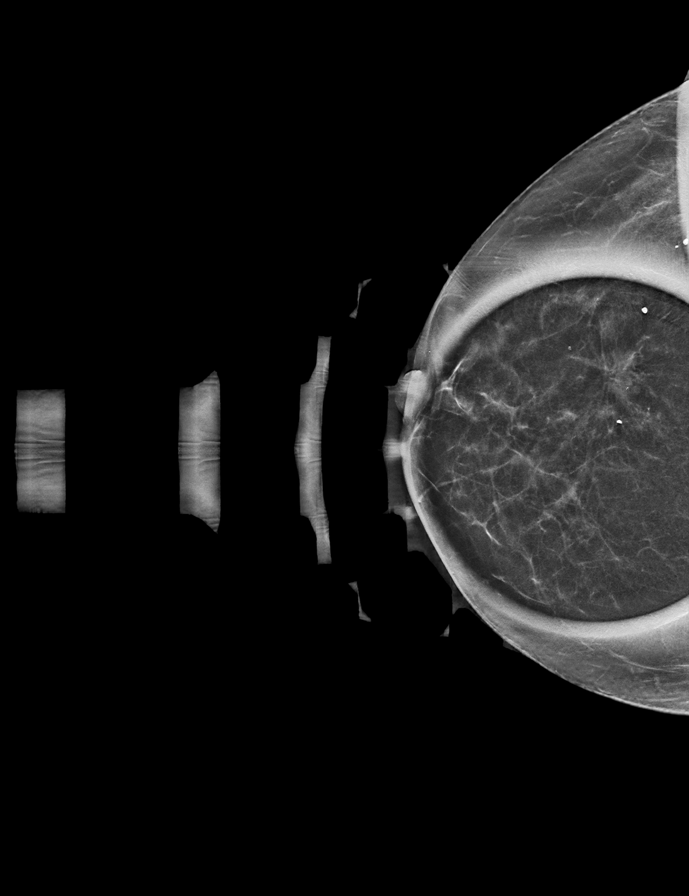

[R ML synth-2D]
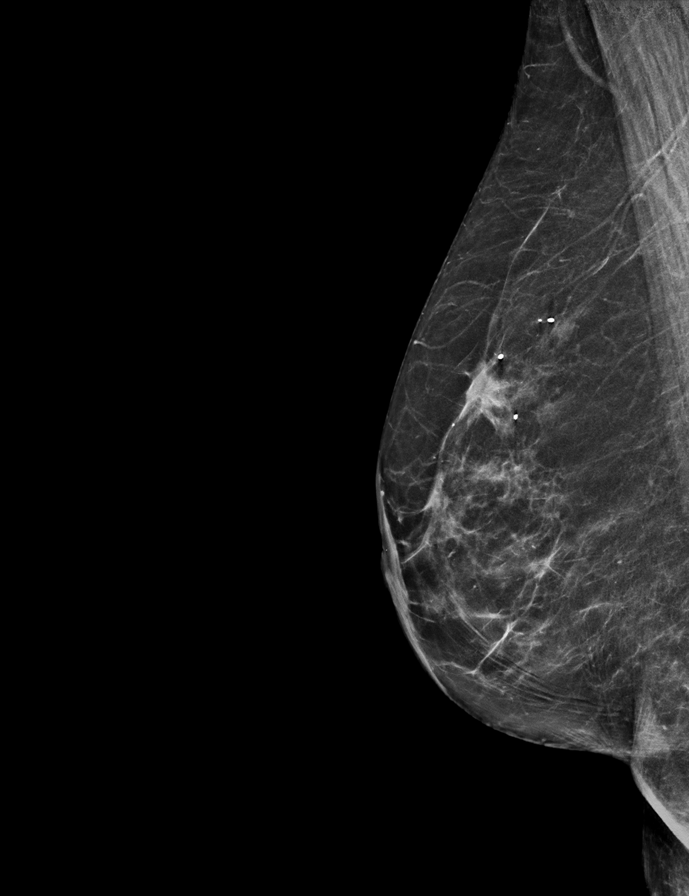

[R MLO synth-2D]
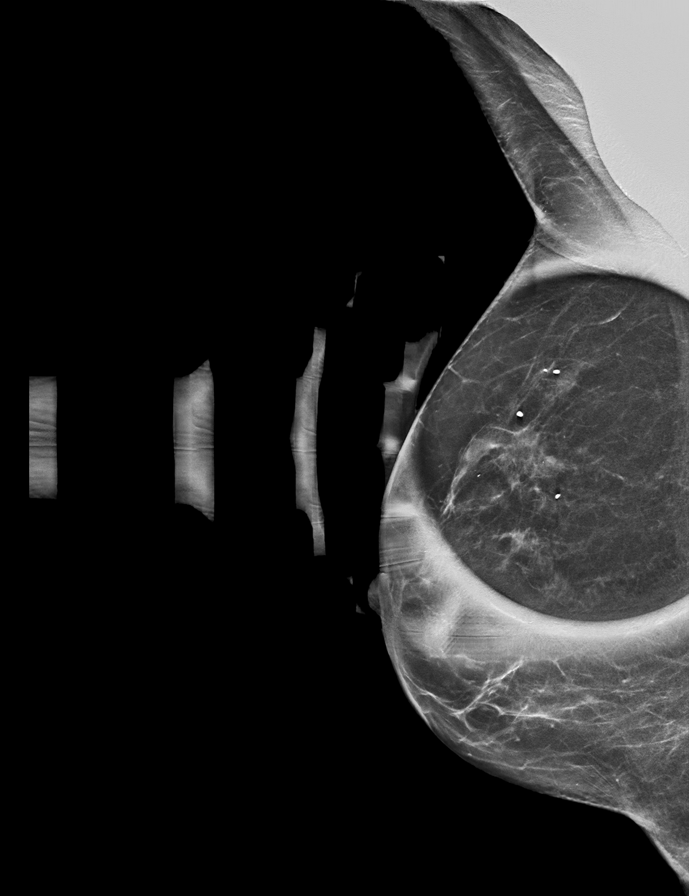

[R ML tomo · tomo slice 30/59.0]
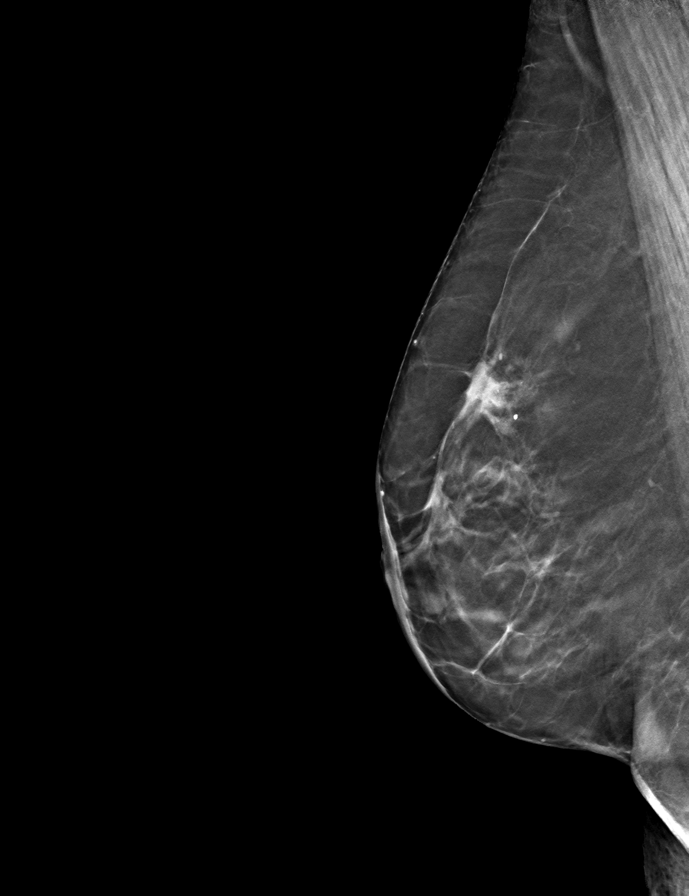

[R CC tomo (1 of 2) · tomo slice 26/51.0]
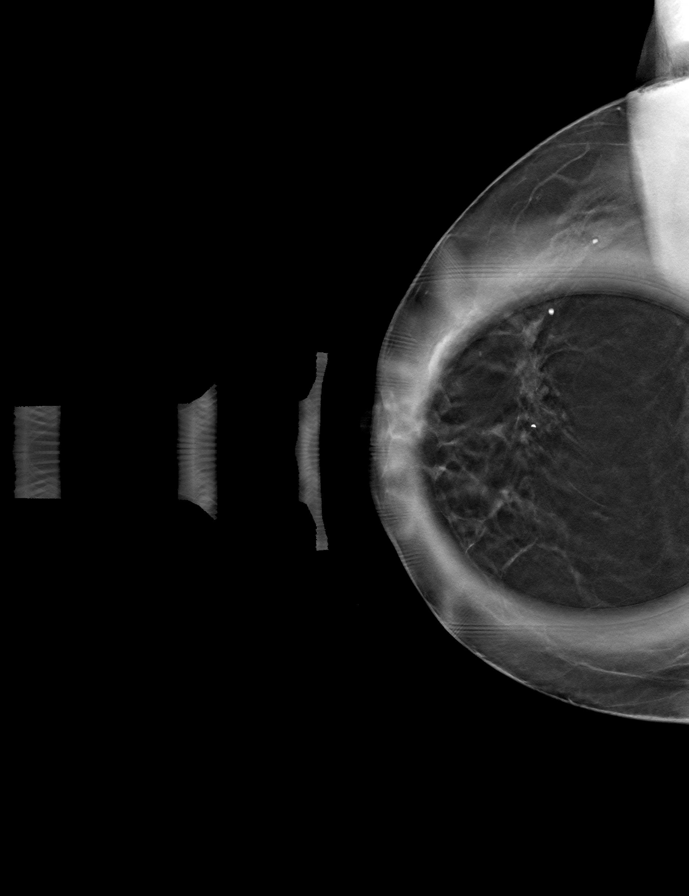

[R CC tomo (2 of 2) · tomo slice 24/47.0]
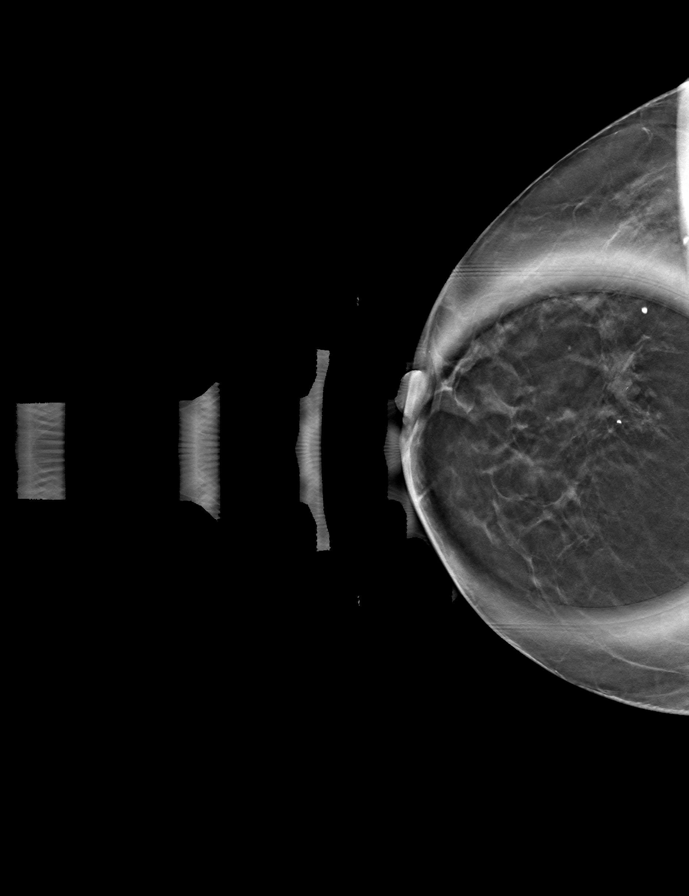

[R MLO tomo · tomo slice 27/53.0]
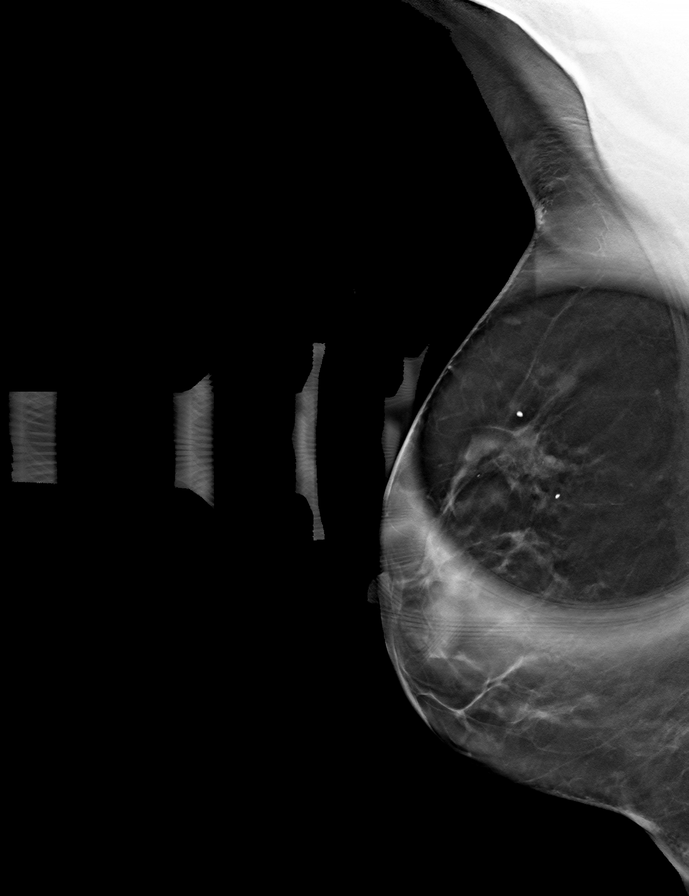

[8 of 24 positions shown; findings below may reference images not displayed]

ACR Breast Density Category b: There are scattered areas of
fibroglandular density.
FINDINGS: Mammogram:

Additional spot compression tomosynthesis and full field mL views
were performed for the questioned distortion in the right breast. On
the additional imaging there is persistence of distortion in the
upper central slightly inner right breast. There are no additional
new findings identified in the right breast.

On physical exam, I do not palpate a fixed discrete mass in the
superior aspect of the right breast.

Ultrasound:

Targeted ultrasound is performed in the right breast at 11 o'clock 4
cm from the nipple demonstrating an area of distorted tissue
measuring approximately 0.8 x 0.7 x 0.7 cm. This likely corresponds
to the mammographic finding.

Targeted ultrasound of the right axilla demonstrates
normal-appearing lymph nodes.
IMPRESSION: Architectural distortion in the upper central slightly inner right
breast is suspicious.

RECOMMENDATION:
Ultrasound-guided core needle biopsy of the right breast distortion.

I have discussed the findings and recommendations with the patient.
If applicable, a reminder letter will be sent to the patient
regarding the next appointment.

BI-RADS CATEGORY  4: Suspicious.

## 2021-10-06 ENCOUNTER — Ambulatory Visit
Admission: RE | Admit: 2021-10-06 | Discharge: 2021-10-06 | Disposition: A | Discharge status: Home / Self Care | Payer: PPO | Source: Ambulatory Visit | Attending: Internal Medicine | Admitting: Internal Medicine

## 2021-10-06 DIAGNOSIS — Z780 Asymptomatic menopausal state: Secondary | ICD-10-CM | POA: Diagnosis not present

## 2021-10-06 DIAGNOSIS — M810 Age-related osteoporosis without current pathological fracture: Secondary | ICD-10-CM | POA: Insufficient documentation

## 2021-10-06 DIAGNOSIS — C50411 Malignant neoplasm of upper-outer quadrant of right female breast: Secondary | ICD-10-CM | POA: Diagnosis not present

## 2021-10-06 DIAGNOSIS — Z170 Estrogen receptor positive status [ER+]: Secondary | ICD-10-CM | POA: Insufficient documentation

## 2021-10-11 ENCOUNTER — Encounter: Payer: Self-pay | Admitting: Radiation Oncology

## 2021-10-11 ENCOUNTER — Ambulatory Visit
Admission: RE | Admit: 2021-10-11 | Discharge: 2021-10-11 | Disposition: A | Discharge status: Home / Self Care | Payer: PPO | Source: Ambulatory Visit | Attending: Radiation Oncology | Admitting: Radiation Oncology

## 2021-10-11 ENCOUNTER — Other Ambulatory Visit: Payer: Self-pay | Admitting: Family

## 2021-10-11 VITALS — BP 133/77 | HR 74 | Temp 98.0°F | Resp 12 | Ht 63.0 in | Wt 133.0 lb

## 2021-10-11 DIAGNOSIS — C50411 Malignant neoplasm of upper-outer quadrant of right female breast: Secondary | ICD-10-CM | POA: Insufficient documentation

## 2021-10-11 DIAGNOSIS — G4700 Insomnia, unspecified: Secondary | ICD-10-CM

## 2021-10-11 DIAGNOSIS — Z170 Estrogen receptor positive status [ER+]: Secondary | ICD-10-CM | POA: Insufficient documentation

## 2021-10-11 DIAGNOSIS — Z79811 Long term (current) use of aromatase inhibitors: Secondary | ICD-10-CM | POA: Insufficient documentation

## 2021-10-11 DIAGNOSIS — Z923 Personal history of irradiation: Secondary | ICD-10-CM | POA: Diagnosis not present

## 2021-10-11 NOTE — Progress Notes (Signed)
Radiation Oncology Follow up Note  Name: Jennifer Clayton   Date:   10/11/2021 MRN:  [id number] DOB: 02-20-1955    This 66 y.o. female presents to the clinic today for 2-year follow-up status post whole breast radiation to her right breast for stage I ER/PR positive invasive mammary carcinoma.  REFERRING PROVIDER: Burnard Hawthorne, FNP  HPI: Patient is a 66 year old female now at 2 years having pleated whole breast radiation to her right breast for stage I ER/PR positive invasive mammary carcinoma.  Seen today in routine follow-up she is doing well.  They found a cyst in her left breast causing some slight discomfort.  She otherwise is without complaint..  Her mammograms back in March were BI-RADS 2 benign which I have reviewed.  She is currently on Femara tolerating it well without side effect.  COMPLICATIONS OF TREATMENT: none  FOLLOW UP COMPLIANCE: keeps appointments   PHYSICAL EXAM:  BP 133/77   Pulse 74   Temp 98 F (36.7 C) (Tympanic)   Resp 12   Ht '5\' 3"'$  (1.6 m) Comment: stated HT  Wt 133 lb (60.3 kg)   BMI 23.56 kg/m  Lungs are clear to A&P cardiac examination essentially unremarkable with regular rate and rhythm. No dominant mass or nodularity is noted in either breast in 2 positions examined. Incision is well-healed. No axillary or supraclavicular adenopathy is appreciated. Cosmetic result is excellent.  Well-developed well-nourished patient in NAD. HEENT reveals PERLA, EOMI, discs not visualized.  Oral cavity is clear. No oral mucosal lesions are identified. Neck is clear without evidence of cervical or supraclavicular adenopathy. Lungs are clear to A&P. Cardiac examination is essentially unremarkable with regular rate and rhythm without murmur rub or thrill. Abdomen is benign with no organomegaly or masses noted. Motor sensory and DTR levels are equal and symmetric in the upper and lower extremities. Cranial nerves II through XII are grossly intact. Proprioception is intact.  No peripheral adenopathy or edema is identified. No motor or sensory levels are noted. Crude visual fields are within normal range.  RADIOLOGY RESULTS: Mammograms reviewed compatible with above-stated findings  PLAN: At the present time patient is doing well with no evidence of disease now at 2 years.  And pleased with her overall progress.  I have asked to see her back in 1 year for follow-up.  Sure his schedule of mammograms.  She continues on Femara without side effect.  Patient is to call with any concerns.  I would like to take this opportunity to thank you for allowing me to participate in the care of your patient.Noreene Filbert, MD

## 2021-10-12 ENCOUNTER — Inpatient Hospital Stay (HOSPITAL_BASED_OUTPATIENT_CLINIC_OR_DEPARTMENT_OTHER): Payer: PPO | Admitting: Internal Medicine

## 2021-10-12 ENCOUNTER — Encounter: Payer: Self-pay | Admitting: Internal Medicine

## 2021-10-12 ENCOUNTER — Inpatient Hospital Stay: Payer: PPO | Attending: Internal Medicine

## 2021-10-12 ENCOUNTER — Inpatient Hospital Stay: Payer: PPO

## 2021-10-12 DIAGNOSIS — M810 Age-related osteoporosis without current pathological fracture: Secondary | ICD-10-CM | POA: Diagnosis not present

## 2021-10-12 DIAGNOSIS — M2550 Pain in unspecified joint: Secondary | ICD-10-CM | POA: Insufficient documentation

## 2021-10-12 DIAGNOSIS — R232 Flushing: Secondary | ICD-10-CM | POA: Insufficient documentation

## 2021-10-12 DIAGNOSIS — C50411 Malignant neoplasm of upper-outer quadrant of right female breast: Secondary | ICD-10-CM

## 2021-10-12 DIAGNOSIS — Z801 Family history of malignant neoplasm of trachea, bronchus and lung: Secondary | ICD-10-CM | POA: Diagnosis not present

## 2021-10-12 DIAGNOSIS — I10 Essential (primary) hypertension: Secondary | ICD-10-CM | POA: Insufficient documentation

## 2021-10-12 DIAGNOSIS — Z79899 Other long term (current) drug therapy: Secondary | ICD-10-CM | POA: Insufficient documentation

## 2021-10-12 DIAGNOSIS — R059 Cough, unspecified: Secondary | ICD-10-CM | POA: Insufficient documentation

## 2021-10-12 DIAGNOSIS — E039 Hypothyroidism, unspecified: Secondary | ICD-10-CM | POA: Diagnosis not present

## 2021-10-12 DIAGNOSIS — G8929 Other chronic pain: Secondary | ICD-10-CM | POA: Diagnosis not present

## 2021-10-12 DIAGNOSIS — Z79811 Long term (current) use of aromatase inhibitors: Secondary | ICD-10-CM | POA: Diagnosis not present

## 2021-10-12 DIAGNOSIS — M8580 Other specified disorders of bone density and structure, unspecified site: Secondary | ICD-10-CM | POA: Diagnosis not present

## 2021-10-12 DIAGNOSIS — Z170 Estrogen receptor positive status [ER+]: Secondary | ICD-10-CM

## 2021-10-12 LAB — BASIC METABOLIC PANEL
Anion gap: 5 (ref 5–15)
BUN: 14 mg/dL (ref 8–23)
CO2: 31 mmol/L (ref 22–32)
Calcium: 9.2 mg/dL (ref 8.9–10.3)
Chloride: 100 mmol/L (ref 98–111)
Creatinine, Ser: 1.01 mg/dL — ABNORMAL HIGH (ref 0.44–1.00)
GFR, Estimated: >60 mL/min (ref >60)
Glucose, Bld: 110 mg/dL — ABNORMAL HIGH (ref 70–99)
Potassium: 4.6 mmol/L (ref 3.5–5.1)
Sodium: 136 mmol/L (ref 135–145)

## 2021-10-12 MED ORDER — DENOSUMAB 60 MG/ML ~~LOC~~ SOSY
60.0000 mg | PREFILLED_SYRINGE | Freq: Once | SUBCUTANEOUS | Status: AC
  Administered 2021-10-12: 60 mg via SUBCUTANEOUS
  Filled 2021-10-12: qty 1

## 2021-10-12 NOTE — Assessment & Plan Note (Addendum)
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  On adjuvant Letrozole [until summer 2026]. STABLE; Tolerating well no major side effects noted except for mild joint pains/hot flashes. Mammo-March 2023 [Dr.Byrnnett]- WNL.     # joint pains- G-1 sec to letrozole. STABLE  # Hot flashes- G-1 sec to letrozole. STABLE  # OSTEOPOROSIS [BMD-MARCH 2021]-T score -3; continue calcium plus vitamin D plus exercise program. On Prolia [2021]; UG 2023- T-score of -2.9.  Overall stable.  Continue Vit D 1000/day.  Continue lifting weights/physical activity.  Discussed the importance of muscle strength.  Will continue prolia for now.  We will repeat bone density again in 2 years.   # DISPOSITION: # Prolia today # Follow up in 6 months- MD; labs-cbc;  BMP;prolia SQ; Dr.B

## 2021-10-12 NOTE — Progress Notes (Signed)
Patient denies new problems/concerns today.   °

## 2021-10-12 NOTE — Progress Notes (Signed)
[facility] [place] NOTE  Patient Care Team: Burnard Hawthorne, FNP as PCP - General (Family Medicine) Theodore Demark, RN (Inactive) as Oncology Nurse Navigator Noreene Filbert, MD as Radiation Oncologist (Radiation Oncology) Bary Castilla Forest Gleason, MD as Consulting Physician (General Surgery) Cammie Sickle, MD as Consulting Physician (Internal Medicine)  CHIEF COMPLAINTS/PURPOSE OF CONSULTATION: Breast cancer  #  Oncology History Overview Note  # MARCH 2021-right breast 11 o'clock position-s/p lumpectomy; SLNBx [Dr. Tollie Pizza ]pT1c (15 mm) p N0-grade-1; ER/PR positive HER-2 negative; Oncotype low risk [risk of recurrence is 5%]-no chemotherapy; s/p RT [08/12/2019]- Oncotyoe- 5% risk of recurrence. No chemo.   #Mid July 2021-letrozole  #Osteoporosis 2021-Prolia  # SURVIVORSHIP:   # GENETICS:   DIAGNOSIS: Right breast cancer  STAGE:   1      ;  GOALS: Cure  CURRENT/MOST RECENT THERAPY : Letrozole    Carcinoma of upper-outer quadrant of right breast in female, estrogen receptor positive ([place])  05/22/2019 Initial Diagnosis   Carcinoma of upper-outer quadrant of right breast in female, estrogen receptor positive ([place])      HISTORY OF PRESENTING ILLNESS: Alone.  Ambulating independently. Jennifer Clayton 66 y.o.  female stage [name]  Patient denies new problems/concerns today.    Mild hot flashes.  Mild chronic joint pains.  Not any worse.  No shortness of breath.   Patient has chronic mild cough dry.  Denies any hemoptysis.  Review of Systems  Constitutional:  Negative for chills, diaphoresis, fever, malaise/fatigue and weight loss.  HENT:  Negative for nosebleeds and sore throat.   Eyes:  Negative for double vision.  Respiratory:  Negative for cough, hemoptysis, sputum production, shortness of breath and wheezing.   Cardiovascular:  Negative for  chest pain, palpitations, orthopnea and leg swelling.  Gastrointestinal:  Negative for abdominal pain, blood in stool, constipation, diarrhea, heartburn, melena, nausea and vomiting.  Genitourinary:  Negative for dysuria, frequency and urgency.  Musculoskeletal:  Positive for joint pain. Negative for back pain.  Skin: Negative.  Negative for itching and rash.  Neurological:  Negative for dizziness, tingling, focal weakness, weakness and headaches.  Endo/Heme/Allergies:  Does not bruise/bleed easily.  Psychiatric/Behavioral:  Negative for depression. The patient is not nervous/anxious and does not have insomnia.      MEDICAL HISTORY:  Past Medical History:  Diagnosis Date  . Breast cancer ([place]) 04/2019   right breast  . Cancer ([place])    breast cancer  . H/O radioactive iodine thyroid ablation   . Headache    MIGRAINES  . History of actinic keratosis 05/30/2018   right posterior shoulder  . History of actinic keratosis 03/31/2020   right forearm  . History of squamous cell carcinoma 10/19/2012   right anterior lower leg / excised 10/31/2012  . Hypertension   . Hypothyroidism   . Personal history of radiation therapy     SURGICAL HISTORY: Past Surgical History:  Procedure Laterality Date  . ABDOMINAL HYSTERECTOMY     Age 27, for bleeding and cyst; NO ovaries  . BREAST BIOPSY Right 04/25/2019   positive/ done in Dr. Dwyane Luo office  . BREAST LUMPECTOMY Right 05/13/2019   positive  . BREAST LUMPECTOMY WITH SENTINEL LYMPH NODE BIOPSY Right 05/13/2019   Procedure: BREAST LUMPECTOMY WITH SENTINEL LYMPH NODE BX;  Surgeon: Robert Bellow, MD;  Location: [facility] ORS;  Service: General;  Laterality: Right;  Marland Kitchen VAGINAL DELIVERY  SOCIAL HISTORY: Social History   Socioeconomic History  . Marital status: Married    Spouse name: Not on file  . Number of children: Not on file  . Years of education: Not on file  . Highest education level: Not on file  Occupational History  .  Not on file  Tobacco Use  . Smoking status: Never  . Smokeless tobacco: Never  Vaping Use  . Vaping Use: Never used  Substance and Sexual Activity  . Alcohol use: Yes    Alcohol/week: 1.0 standard drink of alcohol    Types: 1 Glasses of wine per week    Comment: rare  . Drug use: Never  . Sexual activity: Not on file  Other Topics Concern  . Not on file  Social History Narrative   Lives in [place] with husband and son and 4 grandchildren.      Work - Full time in [employer] - regular diet      Exercise - typically daily at [facility] for 45-25min, lifting some weight, ellipitical, walks.       Never smoked; social alcohol.       Social Determinants of Health   Financial Resource Strain: Low Risk  (03/16/2021)   Overall Financial Resource Strain (CARDIA)   . Difficulty of Paying Living Expenses: Not hard at all  Food Insecurity: No Food Insecurity (03/16/2021)   Hunger Vital Sign   . Worried About Charity fundraiser in the Last Year: Never true   . Ran Out of Food in the Last Year: Never true  Transportation Needs: No Transportation Needs (03/16/2021)   PRAPARE - Transportation   . Lack of Transportation (Medical): No   . Lack of Transportation (Non-Medical): No  Physical Activity: Sufficiently Active (03/16/2021)   Exercise Vital Sign   . Days of Exercise per Week: 5 days   . Minutes of Exercise per Session: 60 min  Stress: No Stress Concern Present (03/16/2021)   [place]   . Feeling of Stress : Not at all  Social Connections: Unknown (03/16/2021)   Social Connection and Isolation Panel [NHANES]   . Frequency of Communication with Friends and Family: More than three times a week   . Frequency of Social Gatherings with Friends and Family: More than three times a week   . Attends Religious Services: Not on file   . Active Member of Clubs or Organizations: Not on file   . Attends Theatre manager Meetings: Not on file   . Marital Status: Not on file  Intimate Partner Violence: Not At Risk (03/16/2021)   Humiliation, Afraid, Rape, and Kick questionnaire   . Fear of Current or Ex-Partner: No   . Emotionally Abused: No   . Physically Abused: No   . Sexually Abused: No    FAMILY HISTORY: Family History  Problem Relation Age of Onset  . Cancer Mother        lung, non-smoker  . Dementia Father        related to traumatic brain injury  . Breast cancer Neg Hx     ALLERGIES:  is allergic to zomepirac, amoxicillin-pot clavulanate, and other.  MEDICATIONS:  Current Outpatient Medications  Medication Sig Dispense Refill  . amLODipine (NORVASC) 5 MG tablet TAKE 1 TABLET BY MOUTH ONCE DAILY 90 tablet 3  . Ascorbic Acid (VITAMIN C PO) Take 1 tablet by mouth daily.    [facility]  XL) 300 MG 24 hr tablet TAKE 1 TABLET BY MOUTH DAILY 90 tablet 1  . calcium carbonate (OS-CAL - DOSED IN MG OF ELEMENTAL CALCIUM) 1250 (500 Ca) MG tablet Take 1 tablet by mouth daily.    Marland Kitchen denosumab (PROLIA) 60 MG/ML SOSY injection Inject 60 mg into the skin every 6 (six) months.    . fluorouracil (EFUDEX) 5 % cream Apply topically 2 (two) times daily. As directed 15 g 0  . letrozole (FEMARA) 2.5 MG tablet TAKE ONE TABLET BY MOUTH EVERY DAY 90 tablet 3  . levothyroxine (SYNTHROID) 100 MCG tablet TAKE 1 TABLET EVERY DAY ON EMPTY STOMACHWITH A GLASS OF WATER AT LEAST 30-60 MINBEFORE BREAKFAST (Patient taking differently: Take 100 mcg by mouth daily before breakfast.) 90 tablet 0  . Multiple Vitamins-Minerals (MULTIVITAMIN ADULT PO) Take 1 tablet by mouth daily.    . mupirocin ointment (BACTROBAN) 2 % Apply to skin qd-bid 22 g 1  . rosuvastatin (CRESTOR) 5 MG tablet TAKE 1 TABLET BY MOUTH EVERY EVENING 90 tablet 3  . SUMAtriptan (IMITREX) 50 MG tablet TAKE 1 TABLET ONCE MAY REPEAT IN 2 HOURSIF NECESSARY 15 tablet 2  . zolpidem (AMBIEN) 5 MG tablet TAKE 1 TABLET BY MOUTH NIGHTLY 30 tablet 2    No current facility-administered medications for this visit.      Marland Kitchen  PHYSICAL EXAMINATION: ECOG PERFORMANCE STATUS: 0 - Asymptomatic  Vitals:   10/12/21 1036  BP: 129/79  Pulse: 69  Resp: 16  Temp: 98.2 F (36.8 C)    Filed Weights   10/12/21 1036  Weight: 132 lb (59.9 kg)     Physical Exam HENT:     Head: Normocephalic and atraumatic.     Mouth/Throat:     Pharynx: No oropharyngeal exudate.  Eyes:     Pupils: Pupils are equal, round, and reactive to light.  Cardiovascular:     Rate and Rhythm: Normal rate and regular rhythm.  Pulmonary:     Effort: Pulmonary effort is normal. No respiratory distress.     Breath sounds: Normal breath sounds. No wheezing.  Abdominal:     General: Bowel sounds are normal. There is no distension.     Palpations: Abdomen is soft. There is no mass.     Tenderness: There is no abdominal tenderness. There is no guarding or rebound.  Musculoskeletal:        General: No tenderness. Normal range of motion.     Cervical back: Normal range of motion and neck supple.  Skin:    General: Skin is warm.  Neurological:     Mental Status: She is alert and oriented to person, place, and time.  Psychiatric:        Mood and Affect: Affect normal.     LABORATORY DATA:  Clayton have reviewed the data as listed Lab Results  Component Value Date   WBC 5.7 10/12/2020   HGB 13.0 10/12/2020   HCT 39.0 10/12/2020   MCV 95.1 10/12/2020   PLT 219 10/12/2020   Recent Labs    04/13/21 1008 10/12/21 1014  NA 134* 136  K 4.1 4.6  CL 99 100  CO2 29 31  GLUCOSE 110* 110*  BUN 11 14  CREATININE 0.74 1.01*  CALCIUM 8.6* 9.2  GFRNONAA >60 >60    RADIOGRAPHIC STUDIES: Clayton have personally reviewed the radiological images as listed and agreed with the findings in the report. DG Bone Density  Result Date: 10/06/2021 EXAM: DUAL X-RAY ABSORPTIOMETRY (DXA) FOR BONE MINERAL  DENSITY IMPRESSION: Your patient Jennifer Clayton completed a BMD test on 10/06/2021  using the [place] (software version: 14.10) manufactured by [employer]. The following summarizes the results of our evaluation. Technologist: [facility] PATIENT BIOGRAPHICAL: Name: Jennifer Clayton, Jennifer Clayton Patient ID: [id number] Birth Date: [birth year] Height: 63.0 in. Gender: Female Exam Date: 10/06/2021 Weight: 130.8 lbs. Indications: Bilateral Ovariectomy, Caucasian, History of Breast Cancer, History of Fracture (Adult), History of Radiation, Hypothyroid, Hysterectomy, Osteoporotic, Postmenopausal Fractures: compression fracture, Right Wrist Treatments: CALCIUM VIT D, Letrozole, LEVOTHYROXINE, Multi-Vitamin DENSITOMETRY RESULTS: Site      Region     Measured Date Measured Age WHO Classification Young Adult T-score BMD         %Change vs. Previous Significant Change (*) AP Spine L2-L4 10/06/2019 68.5 Osteoporosis -2.9 0.859 g/cm2 2.1% - AP Spine L2-L4 04/09/2019 66.0 Osteoporosis -3.0 0.841 g/cm2 -3.3% Yes AP Spine L2-L4 04/06/2011 58.0 Osteoporosis -2.8 0.870 g/cm2 - - DualFemur Neck Right 10/06/2021 68.5 Osteoporosis -2.5 0.696 g/cm2 5.0% - DualFemur Neck Right 04/09/2019 66.0 Osteoporosis -2.7 0.663 g/cm2 -4.5% - DualFemur Neck Right 04/06/2011 58.0 Osteoporosis -2.5 0.694 g/cm2 - - DualFemur Total Mean 10/06/2021 68.5 Osteopenia -2.2 0.733 g/cm2 2.9% Yes DualFemur Total Mean 04/09/2019 66.0 Osteopenia -2.3 0.712 g/cm2 -5.2% Yes DualFemur Total Mean 04/06/2011 58.0 Osteopenia -2.0 0.751 g/cm2 - - ASSESSMENT: The BMD measured at AP Spine L2-L4 is 0.859 g/cm2 with a T-score of -2.9. This patient is considered osteoporotic according to [facility]) criteria. The scan quality is good. L-1 was excluded due to degenerative changes. Compared with prior study, there has been no significant change in the spine. Compared with prior study, there has been a significant increase in the total hip. World Pharmacologist [facility]) criteria for post-menopausal, Caucasian Women: Normal:                    T-score at or above -1 SD Osteopenia/low bone mass: T-score between -1 and -2.5 SD Osteoporosis:             T-score at or below -2.5 SD RECOMMENDATIONS: 1. All patients should optimize calcium and vitamin D intake. 2. Consider FDA-approved medical therapies in postmenopausal women and men aged 33 years and older, based on the following: a. A hip or vertebral(clinical or morphometric) fracture b. T-score < -2.5 at the femoral neck or spine after appropriate evaluation to exclude secondary causes c. Low bone mass (T-score between -1.0 and -2.5 at the femoral neck or spine) and a 10-year probability of a hip fracture > 3% or a 10-year probability of a major osteoporosis-related fracture > 20% based on the [place]-adapted WHO algorithm 3. Clinician judgment and/or patient preferences may indicate treatment for people with 10-year fracture probabilities above or below these levels FOLLOW-UP: People with diagnosed cases of osteoporosis or at high risk for fracture should have regular bone mineral density tests. For patients eligible for Medicare, routine testing is allowed once every 2 years. The testing frequency can be increased to one year for patients who have rapidly progressing disease, those who are receiving or discontinuing medical therapy to restore bone mass, or have additional risk factors. Clayton have reviewed this report, and agree with the above findings. [facility] Electronically Signed   By: Ammie Ferrier M.D.   On: 10/06/2021 10:56    ASSESSMENT & PLAN:   Carcinoma of upper-outer quadrant of right breast in female, estrogen receptor positive ([place]) # Invasive mammary carcinoma right breast-pT1cpN0; grade 1 ER positive  PR negative HER-2 negative.  On adjuvant Letrozole [until summer 2026]. STABLE; Tolerating well no major side effects noted except for mild joint pains/hot flashes. Mammo-March 2023 [Dr.Byrnnett]- WNL.     # joint pains- G-1 sec to letrozole. STABLE  # Hot flashes-  G-1 sec to letrozole. STABLE  # OSTEOPOROSIS [BMD-MARCH 2021]-T score -3; continue calcium plus vitamin D plus exercise program. On Prolia [2021]; UG 2023- T-score of -2.9.  Overall stable.  Continue Vit D 1000/day.  Continue lifting weights/physical activity.  Discussed the importance of muscle strength.  Will continue prolia for now.  We will repeat bone density again in 2 years.   # DISPOSITION: # Prolia today # Follow up in 6 months- MD; labs-cbc;  BMP;prolia SQ; Dr.B  All questions were answered. The patient/family knows to call the clinic with any problems, questions or concerns.    Cammie Sickle, MD 10/12/2021 12:48 PM

## 2021-11-03 ENCOUNTER — Telehealth: Payer: Self-pay | Admitting: *Deleted

## 2021-11-03 NOTE — Patient Outreach (Signed)
  Care Coordination   11/03/2021 Name: [name] MRN: [id number] DOB: 1955-04-06   Care Coordination Outreach Attempts:  An unsuccessful telephone outreach was attempted today to offer the patient information about available care coordination services as a benefit of their health plan.   Follow Up Plan:  Additional outreach attempts will be made to offer the patient care coordination information and services.   Encounter Outcome:  No Answer  Care Coordination Interventions Activated:  Yes   Care Coordination Interventions:  No, not indicated    [facility] [phone]

## 2021-12-01 ENCOUNTER — Ambulatory Visit (INDEPENDENT_AMBULATORY_CARE_PROVIDER_SITE_OTHER): Payer: PPO | Admitting: Family

## 2021-12-01 ENCOUNTER — Encounter: Payer: Self-pay | Admitting: Family

## 2021-12-01 ENCOUNTER — Ambulatory Visit (INDEPENDENT_AMBULATORY_CARE_PROVIDER_SITE_OTHER): Payer: PPO

## 2021-12-01 VITALS — BP 124/74 | HR 76 | Temp 97.7°F | Ht 63.0 in | Wt 130.4 lb

## 2021-12-01 DIAGNOSIS — R053 Chronic cough: Secondary | ICD-10-CM | POA: Diagnosis not present

## 2021-12-01 DIAGNOSIS — I10 Essential (primary) hypertension: Secondary | ICD-10-CM

## 2021-12-01 DIAGNOSIS — E039 Hypothyroidism, unspecified: Secondary | ICD-10-CM

## 2021-12-01 DIAGNOSIS — M810 Age-related osteoporosis without current pathological fracture: Secondary | ICD-10-CM | POA: Diagnosis not present

## 2021-12-01 DIAGNOSIS — R059 Cough, unspecified: Secondary | ICD-10-CM | POA: Diagnosis not present

## 2021-12-01 DIAGNOSIS — Z23 Encounter for immunization: Secondary | ICD-10-CM | POA: Diagnosis not present

## 2021-12-01 DIAGNOSIS — G4700 Insomnia, unspecified: Secondary | ICD-10-CM

## 2021-12-01 DIAGNOSIS — E785 Hyperlipidemia, unspecified: Secondary | ICD-10-CM | POA: Diagnosis not present

## 2021-12-01 LAB — LIPID PANEL
Cholesterol: 153 mg/dL (ref 0–200)
HDL: 64.4 mg/dL (ref >39.00)
LDL Cholesterol: 68 mg/dL (ref 0–99)
NonHDL: 88.34
Total CHOL/HDL Ratio: 2
Triglycerides: 104 mg/dL (ref 0.0–149.0)
VLDL: 20.8 mg/dL (ref 0.0–40.0)

## 2021-12-01 LAB — VITAMIN D 25 HYDROXY (VIT D DEFICIENCY, FRACTURES): VITD: 35.42 ng/mL (ref 30.00–100.00)

## 2021-12-01 MED ORDER — OMEPRAZOLE 20 MG PO CPDR: 20.0000 mg | DELAYED_RELEASE_CAPSULE | Freq: Every day | ORAL | 0 refills | Status: AC

## 2021-12-01 NOTE — Assessment & Plan Note (Addendum)
Acute on chronic.  Onset after COVID 03/2021.  Differentials include GERD, postnasal drip.  Patient will start with trial of omeprazole for 2 weeks.  If no improvement she will then stop omeprazole and start antihistamine.  If no improvement we discussed azelastine and/or Symbicort trial, pulmonology consult.  Pending chest xray.

## 2021-12-01 NOTE — Assessment & Plan Note (Signed)
Chronic, stable.  Continue Ambien 5 mg 

## 2021-12-01 NOTE — Progress Notes (Signed)
Subjective:    Patient ID: Jennifer Clayton, female    DOB: August 10, 1955, 66 y.o.   MRN: [id number]  CC: Jennifer Clayton is a 66 y.o. female who presents today for follow up.   HPI: Insomnia-compliant with Ambien 5 mg     Seen for cough 4 months ago and treated with azithromycin without relief.  She reports intermittent cough with clear to yellow mucous since covid 03/2021.  She has 3 weeks without cough and then returned. Some improvement with flonase.   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 Dwyane Luo office   BREAST LUMPECTOMY Right 05/13/2019   positive   BREAST LUMPECTOMY WITH SENTINEL LYMPH NODE BIOPSY Right 05/13/2019   Procedure: BREAST LUMPECTOMY WITH SENTINEL LYMPH NODE BX;  Surgeon: Robert Bellow, MD;  Location: [facility] ORS;  Service: General;  Laterality: Right;   VAGINAL DELIVERY     Family History  Problem Relation Age of Onset   Cancer Mother        lung, non-smoker   Dementia Father        related to traumatic brain injury   Breast cancer Neg Hx     Allergies: Zomepirac, Amoxicillin-pot clavulanate, and Other Current Outpatient Medications on File Prior to Visit  Medication Sig Dispense Refill   amLODipine (NORVASC) 5 MG tablet TAKE 1  TABLET BY MOUTH ONCE DAILY 90 tablet 3   Ascorbic Acid (VITAMIN C PO) Take 1 tablet by mouth daily.     buPROPion (WELLBUTRIN XL) 300 MG 24 hr tablet TAKE 1 TABLET BY MOUTH DAILY 90 tablet 1   calcium carbonate (OS-CAL - DOSED IN MG OF ELEMENTAL CALCIUM) 1250 (500 Ca) MG tablet Take 1 tablet by mouth daily.     denosumab (PROLIA) 60 MG/ML SOSY injection Inject 60 mg into the skin every 6 (six) months.     fluorouracil (EFUDEX) 5 % cream Apply topically 2 (two) times daily. As directed 15 g 0   letrozole (FEMARA) 2.5 MG tablet TAKE ONE TABLET BY MOUTH EVERY DAY 90 tablet 3   levothyroxine (SYNTHROID) 100 MCG tablet TAKE 1 TABLET EVERY DAY ON EMPTY STOMACHWITH A GLASS OF WATER AT LEAST 30-60 MINBEFORE BREAKFAST (Patient taking differently: Take 100 mcg by mouth daily before breakfast.) 90 tablet 0   Multiple Vitamins-Minerals (MULTIVITAMIN ADULT PO) Take 1 tablet by mouth daily.     mupirocin ointment (BACTROBAN) 2 % Apply to skin qd-bid 22 g 1   rosuvastatin (CRESTOR) 5 MG tablet TAKE 1 TABLET BY MOUTH EVERY EVENING 90 tablet 3   SUMAtriptan (IMITREX) 50 MG tablet TAKE 1 TABLET ONCE MAY REPEAT IN 2 HOURSIF NECESSARY 15 tablet 2  zolpidem (AMBIEN) 5 MG tablet TAKE 1 TABLET BY MOUTH NIGHTLY 30 tablet 2   No current facility-administered medications on file prior to visit.    Social History   Tobacco Use   Smoking status: Never   Smokeless tobacco: Never  Vaping Use   Vaping Use: Never used  Substance Use Topics   Alcohol use: Yes    Alcohol/week: 1.0 standard drink of alcohol    Types: 1 Glasses of wine per week    Comment: rare   Drug use: Never    Review of Systems  Constitutional:  Negative for chills and fever.  HENT:  Positive for congestion and postnasal drip.   Respiratory:  Positive for cough. Negative for shortness of breath and wheezing.   Cardiovascular:  Negative for chest pain, palpitations and leg swelling.  Gastrointestinal:  Negative for nausea and vomiting.       Objective:    BP 124/74 (BP Location: Left Arm, Patient Position: Sitting, Cuff Size: Normal)   Pulse 76   Temp 97.7 F (36.5 C) (Oral)   Ht '5\' 3"'$  (1.6 m)   Wt 130 lb 6.4 oz (59.1 kg)   SpO2 98%   BMI 23.10 kg/m  BP Readings from Last 3 Encounters:  12/01/21 124/74  10/12/21 129/79  10/11/21 133/77   Wt Readings from Last 3 Encounters:  12/01/21 130 lb 6.4 oz (59.1 kg)  10/12/21 132 lb (59.9 kg)  10/11/21 133 lb (60.3 kg)    Physical Exam Vitals reviewed.  Constitutional:      Appearance: She is well-developed.  HENT:     Head: Normocephalic and atraumatic.     Right Ear: Hearing, tympanic membrane, ear canal and external ear normal. No decreased hearing noted. No drainage, swelling or tenderness. No middle ear effusion. No foreign body. Tympanic membrane is not erythematous or bulging.     Left Ear: Hearing, tympanic membrane, ear canal and external ear normal. No decreased hearing noted. No drainage, swelling or tenderness.  No middle ear effusion. No foreign body. Tympanic membrane is not erythematous or bulging.     Nose: Nose normal. No rhinorrhea.     Right Sinus: No maxillary sinus tenderness or frontal sinus tenderness.     Left Sinus: No maxillary sinus tenderness or frontal sinus tenderness.     Mouth/Throat:     Pharynx: Uvula midline. No oropharyngeal exudate or posterior oropharyngeal erythema.     Tonsils: No tonsillar abscesses.  Eyes:     Conjunctiva/sclera: Conjunctivae normal.  Cardiovascular:     Rate and Rhythm: Regular rhythm.     Pulses: Normal pulses.     Heart sounds: Normal heart sounds.  Pulmonary:     Effort: Pulmonary effort is normal.     Breath sounds: Normal breath sounds. No wheezing, rhonchi or rales.  Lymphadenopathy:     Head:     Right side of head: No submental, submandibular, tonsillar, preauricular, posterior auricular or occipital adenopathy.     Left side of head: No submental, submandibular, tonsillar, preauricular, posterior  auricular or occipital adenopathy.     Cervical: No cervical adenopathy.  Skin:    General: Skin is warm and dry.  Neurological:     Mental Status: She is alert.  Psychiatric:        Speech: Speech normal.        Behavior: Behavior normal.        Thought Content: Thought content normal.        Assessment & Plan:  Problem List Items Addressed This Visit       Cardiovascular and Mediastinum   HTN (hypertension)   Relevant Orders   Lipid Profile (Completed)     Endocrine   Hypothyroidism     Musculoskeletal and Integument   Osteoporosis   Relevant Orders   Vitamin D (25 hydroxy) (Completed)     Other   Chronic cough - Primary    Acute on chronic.  Onset after COVID 03/2021.  Differentials include GERD, postnasal drip.  Patient will start with trial of omeprazole for 2 weeks.  If no improvement she will then stop omeprazole and start antihistamine.  If no improvement we discussed azelastine and/or Symbicort trial, pulmonology consult.  Pending chest xray.       Relevant Medications   omeprazole (PRILOSEC) 20 MG capsule   Other Relevant Orders   DG Chest 2 View   Insomnia    Chronic, stable.  Continue Ambien '5mg'$       Other Visit Diagnoses     Hyperlipidemia, unspecified hyperlipidemia type       Relevant Orders   Lipid Profile (Completed)   Need for immunization against influenza       Relevant Orders   Flu Vaccine QUAD High Dose(Fluad) (Completed)        I am having Jennifer I. Helfrich "Debbie" start on omeprazole. I am also having her maintain her Multiple Vitamins-Minerals (MULTIVITAMIN ADULT PO), levothyroxine, Ascorbic Acid (VITAMIN C PO), calcium carbonate, mupirocin ointment, denosumab, rosuvastatin, fluorouracil, amLODipine, SUMAtriptan, buPROPion, letrozole, and zolpidem.   Meds ordered this encounter  Medications   omeprazole (PRILOSEC) 20 MG capsule    Sig: Take 1 capsule (20 mg total) by mouth daily.    Dispense:  30 capsule    Refill:  0     Order Specific Question:   Supervising Provider    Answer:   Crecencio Mc [[id number]]    Return precautions given.   Risks, benefits, and alternatives of the medications and treatment plan prescribed today were discussed, and patient expressed understanding.   Education regarding symptom management and diagnosis given to patient on AVS.  Continue to follow with Burnard Hawthorne, FNP for routine health maintenance.   Jennifer Clayton and I agreed with plan.   Mable Paris, FNP

## 2021-12-01 NOTE — Patient Instructions (Addendum)
2 week trial first of omeprazole , then claritin daily over counter  Could consider nasal azelastine ( antihistamine)  Let me know how you are doing at his next steps would be a trial of Symbicort which is an inhaler with a low-dose of Prednisone  Cough, Adult Coughing is a reflex that clears your throat and your airways (respiratory system). Coughing helps to heal and protect your lungs. It is normal to cough occasionally, but a cough that happens with other symptoms or lasts a long time may be a sign of a condition that needs treatment. An acute cough may only last 2-3 weeks, while a chronic cough may last 8 or more weeks. Coughing is commonly caused by: Infection of the respiratory systemby viruses or bacteria. Breathing in substances that irritate your lungs. Allergies. Asthma. Mucus that runs down the back of your throat (postnasal drip). Smoking. Acid backing up from the stomach into the esophagus (gastroesophageal reflux). Certain medicines. Chronic lung problems. Other medical conditions such as heart failure or a blood clot in the lung (pulmonary embolism). Follow these instructions at home: Medicines Take over-the-counter and prescription medicines only as told by your health care provider. Talk with your health care provider before you take a cough suppressant medicine. Lifestyle Avoid cigarette smoke. Do not use any products that contain nicotine or tobacco, such as cigarettes, e-cigarettes, and chewing tobacco. If you need help quitting, ask your health care provider. Drink enough fluid to keep your urine pale yellow. Avoid caffeine. Do not drink alcohol if your health care provider tells you not to drink. General instructions  Pay close attention to changes in your cough. Tell your health care provider about them. Always cover your mouth when you cough. Avoid things that make you cough, such as perfume, candles, cleaning products, or campfire or tobacco smoke. If the  air is dry, use a cool mist vaporizer or humidifier in your bedroom or your home to help loosen secretions. If your cough is worse at night, try to sleep in a semi-upright position. Rest as needed. Keep all follow-up visits as told by your health care provider. This is important. Contact a health care provider if you: Have new symptoms. Cough up pus. Have a cough that does not get better after 2-3 weeks or gets worse. Cannot control your cough with cough suppressant medicines and you are losing sleep. Have pain that gets worse or pain that is not helped with medicine. Have a fever. Have unexplained weight loss. Have night sweats. Get help right away if: You cough up blood. You have difficulty breathing. Your heartbeat is very fast. These symptoms may represent a serious problem that is an emergency. Do not wait to see if the symptoms will go away. Get medical help right away. Call your local emergency services (911 in the [place] Do not drive yourself to the hospital. Summary Coughing is a reflex that clears your throat and your airways. It is normal to cough occasionally, but a cough that happens with other symptoms or lasts a long time may be a sign of a condition that needs treatment. Take over-the-counter and prescription medicines only as told by your health care provider. Always cover your mouth when you cough. Contact a health care provider if you have new symptoms or a cough that does not get better after 2-3 weeks or gets worse. This information is not intended to replace advice given to you by your health care provider. Make sure you discuss any questions you have  with your health care provider. Document Revised: 06/22/2021 Document Reviewed: 02/05/2018 Elsevier Patient Education  [place].

## 2021-12-06 DIAGNOSIS — R0602 Shortness of breath: Secondary | ICD-10-CM | POA: Diagnosis not present

## 2021-12-06 DIAGNOSIS — R053 Chronic cough: Secondary | ICD-10-CM | POA: Insufficient documentation

## 2021-12-06 DIAGNOSIS — E039 Hypothyroidism, unspecified: Secondary | ICD-10-CM | POA: Insufficient documentation

## 2021-12-06 DIAGNOSIS — I10 Essential (primary) hypertension: Secondary | ICD-10-CM | POA: Diagnosis not present

## 2021-12-06 LAB — CBC
HCT: 42.3 % (ref 36.0–46.0)
Hemoglobin: 14.3 g/dL (ref 12.0–15.0)
MCH: 31.1 pg (ref 26.0–34.0)
MCHC: 33.8 g/dL (ref 30.0–36.0)
MCV: 92 fL (ref 80.0–100.0)
Platelets: 207 10*3/uL (ref 150–400)
RBC: 4.6 MIL/uL (ref 3.87–5.11)
RDW: 11.7 % (ref 11.5–15.5)
WBC: 5.3 10*3/uL (ref 4.0–10.5)
nRBC: 0 % (ref 0.0–0.2)

## 2021-12-06 LAB — BASIC METABOLIC PANEL
Anion gap: 8 (ref 5–15)
BUN: 13 mg/dL (ref 8–23)
CO2: 26 mmol/L (ref 22–32)
Calcium: 9.5 mg/dL (ref 8.9–10.3)
Chloride: 104 mmol/L (ref 98–111)
Creatinine, Ser: 0.98 mg/dL (ref 0.44–1.00)
GFR, Estimated: >60 mL/min (ref >60)
Glucose, Bld: 108 mg/dL — ABNORMAL HIGH (ref 70–99)
Potassium: 4.7 mmol/L (ref 3.5–5.1)
Sodium: 138 mmol/L (ref 135–145)

## 2021-12-06 LAB — URINALYSIS, ROUTINE W REFLEX MICROSCOPIC
Bilirubin Urine: NEGATIVE
Glucose, UA: NEGATIVE mg/dL
Hgb urine dipstick: NEGATIVE
Ketones, ur: NEGATIVE mg/dL
Leukocytes,Ua: NEGATIVE
Nitrite: NEGATIVE
Protein, ur: NEGATIVE mg/dL
Specific Gravity, Urine: 1.008 (ref 1.005–1.030)
pH: 8 (ref 5.0–8.0)

## 2021-12-06 NOTE — ED Triage Notes (Signed)
Pt presents via POV c/o SOB since Saturday. Reports feels "shaky and not feeling right". Denies pain.

## 2021-12-07 ENCOUNTER — Emergency Department
Admission: EM | Admit: 2021-12-07 | Discharge: 2021-12-07 | Disposition: A | Discharge status: Home / Self Care | Payer: PPO | Attending: Emergency Medicine | Admitting: Emergency Medicine

## 2021-12-07 ENCOUNTER — Emergency Department: Payer: PPO

## 2021-12-07 DIAGNOSIS — R0602 Shortness of breath: Secondary | ICD-10-CM | POA: Diagnosis not present

## 2021-12-07 DIAGNOSIS — R053 Chronic cough: Secondary | ICD-10-CM

## 2021-12-07 LAB — TROPONIN I (HIGH SENSITIVITY)
Troponin I (High Sensitivity): 4 ng/L (ref <18)
Troponin I (High Sensitivity): 4 ng/L

## 2021-12-07 MED ORDER — IPRATROPIUM-ALBUTEROL 0.5-2.5 (3) MG/3ML IN SOLN
3.0000 mL | Freq: Once | RESPIRATORY_TRACT | Status: AC
  Administered 2021-12-07: 3 mL via RESPIRATORY_TRACT
  Filled 2021-12-07: qty 3

## 2021-12-07 MED ORDER — ALBUTEROL SULFATE HFA 108 (90 BASE) MCG/ACT IN AERS: 2.0000 | INHALATION_SPRAY | Freq: Four times a day (QID) | RESPIRATORY_TRACT | 0 refills | Status: AC | PRN

## 2021-12-07 NOTE — ED Provider Notes (Signed)
[facility] Provider Note    Event Date/Time   First MD Initiated Contact with Patient 12/07/21 0125     (approximate)   History   Chief Complaint Shortness of Breath   HPI  Jennifer Clayton is a 66 y.o. female with past medical history of hypertension, hypothyroidism, and migraines who presents to the ED complaining of shortness of breath.  Patient reports that she has been dealing with a cough for multiple months, but tonight had sudden onset difficulty breathing while at rest.  She states it felt like "my throat was closing up" but she denies any associated nausea, vomiting, diarrhea, itching, or rash.  She states that during the episode, she had no trouble breathing through her nose and she did not have any pain in her chest.  Symptoms have seemed to resolve over the past couple of hours, patient states she began to feel better after coughing up some phlegm.  She has not had any pain or swelling in her legs.  She saw her PCP for the cough last week and was started on omeprazole due to possible GERD.     Physical Exam   Triage Vital Signs: ED Triage Vitals  Enc Vitals Group     BP 12/06/21 2132 (!) 153/83     Pulse Rate 12/06/21 2132 90     Resp 12/06/21 2132 (!) 21     Temp 12/06/21 2132 97.8 F (36.6 C)     Temp Source 12/06/21 2132 Oral     SpO2 12/06/21 2132 98 %     Weight --      Height --      Head Circumference --      Peak Flow --      Pain Score 12/06/21 2140 0     Pain Loc --      Pain Edu? --      Excl. in [place]? --     Most recent vital signs: Vitals:   12/07/21 0145 12/07/21 0230  BP:  123/79  Pulse: 77 77  Resp: 20 10  Temp:    SpO2: 100% 100%    Constitutional: Alert and oriented. Eyes: Conjunctivae are normal. Head: Atraumatic. Nose: No congestion/rhinnorhea. Mouth/Throat: Mucous membranes are moist.  Posterior oropharynx clear with no erythema, edema, or exudates. Cardiovascular: Normal rate, regular rhythm. Grossly  normal heart sounds.  2+ radial pulses bilaterally. Respiratory: Normal respiratory effort.  No retractions. Lungs CTAB. Gastrointestinal: Soft and nontender. No distention. Musculoskeletal: No lower extremity tenderness nor edema.  Neurologic:  Normal speech and language. No gross focal neurologic deficits are appreciated.    ED Results / Procedures / Treatments   Labs (all labs ordered are listed, but only abnormal results are displayed) Labs Reviewed  BASIC METABOLIC PANEL - Abnormal; Notable for the following components:      Result Value   Glucose, Bld 108 (*)    All other components within normal limits  URINALYSIS, ROUTINE W REFLEX MICROSCOPIC - Abnormal; Notable for the following components:   Color, Urine STRAW (*)    APPearance CLEAR (*)    All other components within normal limits  CBC  TROPONIN I (HIGH SENSITIVITY)  TROPONIN I (HIGH SENSITIVITY)     EKG  ED ECG REPORT I, Blake Divine, the attending physician, personally viewed and interpreted this ECG.   Date: 12/07/2021  EKG Time: 22:11  Rate: 78  Rhythm: normal sinus rhythm  Axis: RAD  Intervals:none  ST&T Change: None  RADIOLOGY  Chest x-ray reviewed and interpreted by me with no infiltrate, edema, or effusion.  PROCEDURES:  Critical Care performed: No  Procedures   MEDICATIONS ORDERED IN ED: Medications  ipratropium-albuterol (DUONEB) 0.5-2.5 (3) MG/3ML nebulizer solution 3 mL (3 mLs Nebulization Given 12/07/21 0212)     IMPRESSION / MDM / ASSESSMENT AND PLAN / ED COURSE  I reviewed the triage vital signs and the nursing notes.                              66 y.o. female with past medical history of hypertension, hypothyroidism, and migraines who presents to the ED complaining of sudden onset shortness of breath and sensation of her throat closing after dealing with a chronic cough for multiple months.  Patient's presentation is most consistent with acute presentation with potential  threat to life or bodily function.  Differential diagnosis includes, but is not limited to, ACS, arrhythmia, PE, pneumonia, CHF, COPD, anaphylaxis, other allergic reaction, anxiety, globus sensation.  Patient nontoxic-appearing and in no acute distress, vital signs are unremarkable.  She is not in any respiratory distress and is maintaining oxygen saturations at 100% on room air.  Lungs are clear to auscultation bilaterally and no signs of edema in her oropharynx.  Symptoms do not seem consistent with allergic reaction as she has no other allergic type symptoms.  Initial work-up reassuring with no evidence of arrhythmia or ischemia on EKG, troponin within normal limits.  Chest x-ray is unremarkable and remainder of labs are reassuring with no significant anemia, leukocytosis, electrolyte abnormality, or AKI.  We will observe on cardiac monitor and repeat troponin, give breathing treatment for ongoing cough.  Repeat troponin within normal limits, patient reports feeling better following breathing treatment, no ongoing shortness of breath at this time.  She is appropriate for discharge home with PCP follow-up, was counseled to continue PPI and will be prescribed albuterol for use as needed.  She was counseled to return to the ED for new or worsening symptoms, patient agrees with plan.      FINAL CLINICAL IMPRESSION(S) / ED DIAGNOSES   Final diagnoses:  SOB (shortness of breath)  Chronic cough     Rx / DC Orders   ED Discharge Orders          Ordered    albuterol (VENTOLIN HFA) 108 (90 Base) MCG/ACT inhaler  Every 6 hours PRN       Note to Pharmacy: Please supply with spacer   12/07/21 0432             Note:  This document was prepared using Dragon voice recognition software and may include unintentional dictation errors.   Blake Divine, MD 12/07/21 [phone]

## 2021-12-13 ENCOUNTER — Other Ambulatory Visit: Payer: Self-pay | Admitting: Family

## 2021-12-13 DIAGNOSIS — E785 Hyperlipidemia, unspecified: Secondary | ICD-10-CM

## 2021-12-13 MED ORDER — NA SULFATE-K SULFATE-MG SULF 17.5-3.13-1.6 GM/177ML PO SOLN: 1.0000 | Freq: Once | ORAL | 0 refills | Status: AC

## 2021-12-13 NOTE — Addendum Note (Signed)
Addended by: Vanetta Mulders on: 12/13/2021 09:34 AM   Modules accepted: Orders

## 2021-12-15 ENCOUNTER — Other Ambulatory Visit: Payer: Self-pay

## 2021-12-15 DIAGNOSIS — E785 Hyperlipidemia, unspecified: Secondary | ICD-10-CM

## 2021-12-15 MED ORDER — ROSUVASTATIN CALCIUM 5 MG PO TABS: 5.0000 mg | ORAL_TABLET | Freq: Every evening | ORAL | 3 refills | Status: DC

## 2021-12-15 NOTE — Telephone Encounter (Signed)
RX SENT PT AWARE

## 2021-12-16 ENCOUNTER — Encounter: Payer: Self-pay | Admitting: Gastroenterology

## 2021-12-16 ENCOUNTER — Ambulatory Visit: Payer: PPO | Admitting: Certified Registered"

## 2021-12-16 ENCOUNTER — Ambulatory Visit
Admission: RE | Admit: 2021-12-16 | Discharge: 2021-12-16 | Disposition: A | Discharge status: Home / Self Care | Payer: PPO | Attending: Gastroenterology | Admitting: Gastroenterology

## 2021-12-16 ENCOUNTER — Encounter: Payer: PPO | Admitting: Dermatology

## 2021-12-16 ENCOUNTER — Encounter
Admission: RE | Disposition: A | Discharge status: Home / Self Care | Payer: Self-pay | Source: Home / Self Care | Attending: Gastroenterology

## 2021-12-16 DIAGNOSIS — J45909 Unspecified asthma, uncomplicated: Secondary | ICD-10-CM | POA: Diagnosis not present

## 2021-12-16 DIAGNOSIS — Z79899 Other long term (current) drug therapy: Secondary | ICD-10-CM | POA: Diagnosis not present

## 2021-12-16 DIAGNOSIS — Z85828 Personal history of other malignant neoplasm of skin: Secondary | ICD-10-CM | POA: Insufficient documentation

## 2021-12-16 DIAGNOSIS — Z1211 Encounter for screening for malignant neoplasm of colon: Secondary | ICD-10-CM

## 2021-12-16 DIAGNOSIS — Z923 Personal history of irradiation: Secondary | ICD-10-CM | POA: Insufficient documentation

## 2021-12-16 DIAGNOSIS — T7840XA Allergy, unspecified, initial encounter: Secondary | ICD-10-CM | POA: Diagnosis not present

## 2021-12-16 DIAGNOSIS — Z853 Personal history of malignant neoplasm of breast: Secondary | ICD-10-CM | POA: Insufficient documentation

## 2021-12-16 DIAGNOSIS — K219 Gastro-esophageal reflux disease without esophagitis: Secondary | ICD-10-CM | POA: Diagnosis not present

## 2021-12-16 DIAGNOSIS — E039 Hypothyroidism, unspecified: Secondary | ICD-10-CM | POA: Diagnosis not present

## 2021-12-16 DIAGNOSIS — I10 Essential (primary) hypertension: Secondary | ICD-10-CM | POA: Insufficient documentation

## 2021-12-16 HISTORY — DX: Dyspnea, unspecified: R06.00

## 2021-12-16 HISTORY — PX: COLONOSCOPY WITH PROPOFOL: SHX5780

## 2021-12-16 SURGERY — COLONOSCOPY WITH PROPOFOL
Anesthesia: General

## 2021-12-16 MED ORDER — PROPOFOL 1000 MG/100ML IV EMUL
INTRAVENOUS | Status: AC
  Filled 2021-12-16: qty 100

## 2021-12-16 MED ORDER — PROPOFOL 10 MG/ML IV BOLUS
INTRAVENOUS | Status: DC | PRN
  Administered 2021-12-16: 10 mg via INTRAVENOUS
  Administered 2021-12-16: 60 mg via INTRAVENOUS

## 2021-12-16 MED ORDER — SODIUM CHLORIDE 0.9 % IV SOLN: INTRAVENOUS | Status: DC

## 2021-12-16 MED ORDER — PROPOFOL 500 MG/50ML IV EMUL
INTRAVENOUS | Status: DC | PRN
  Administered 2021-12-16: 150 ug/kg/min via INTRAVENOUS

## 2021-12-16 MED ORDER — LIDOCAINE HCL (CARDIAC) PF 100 MG/5ML IV SOSY
PREFILLED_SYRINGE | INTRAVENOUS | Status: DC | PRN
  Administered 2021-12-16: 50 mg via INTRAVENOUS

## 2021-12-16 NOTE — Op Note (Signed)
[facility] Gastroenterology Patient Name: Jennifer Clayton Procedure Date: 12/16/2021 7:04 AM MRN: [id number] Account #: [id number] Date of Birth: [birth year] Admit Type: Outpatient Age: 66 Room: [facility] ENDO ROOM 4 Gender: Female Note Status: Finalized Instrument Name: Jasper Riling [id number] Procedure:             Colonoscopy Indications:           Screening for colorectal malignant neoplasm Providers:             Lucilla Lame MD, MD Referring MD:          [name] (Referring MD) Medicines:             Propofol per Anesthesia Complications:         No immediate complications. Procedure:             Pre-Anesthesia Assessment:                        - Prior to the procedure, a History and Physical was                         performed, and patient medications and allergies were                         reviewed. The patient's tolerance of previous                         anesthesia was also reviewed. The risks and benefits                         of the procedure and the sedation options and risks                         were discussed with the patient. All questions were                         answered, and informed consent was obtained. Prior                         Anticoagulants: The patient has taken no anticoagulant                         or antiplatelet agents. ASA Grade Assessment: II - A                         patient with mild systemic disease. After reviewing                         the risks and benefits, the patient was deemed in                         satisfactory condition to undergo the procedure.                        After obtaining informed consent, the colonoscope was                         passed under direct vision. Throughout the procedure,  the patient's blood pressure, pulse, and oxygen                         saturations were monitored continuously. The                         Colonoscope was introduced through  the anus and                         advanced to the the cecum, identified by appendiceal                         orifice and ileocecal valve. The colonoscopy was                         performed without difficulty. The patient tolerated                         the procedure well. The quality of the bowel                         preparation was excellent. Findings:      The perianal and digital rectal examinations were normal.      The colon (entire examined portion) appeared normal. Impression:            - The entire examined colon is normal.                        - No specimens collected. Recommendation:        - Discharge patient to home.                        - Resume previous diet.                        - Continue present medications.                        - Repeat colonoscopy is not recommended for                         surveillance. Procedure Code(s):     --- Professional ---                        [phone], Colonoscopy, flexible; diagnostic, including                         collection of specimen(s) by brushing or washing, when                         performed (separate procedure) Diagnosis Code(s):     --- Professional ---                        Z12.11, Encounter for screening for malignant neoplasm                         of colon CPT copyright 2022 American Medical Association. All rights reserved. The codes documented in this report are preliminary and upon coder review may  be revised  to meet current compliance requirements. Lucilla Lame MD, MD 12/16/2021 7:48:23 AM This report has been signed electronically. Number of Addenda: 0 Note Initiated On: 12/16/2021 7:04 AM Scope Withdrawal Time: 0 hours 6 minutes 36 seconds  Total Procedure Duration: 0 hours 12 minutes 47 seconds  Estimated Blood Loss:  Estimated blood loss: none.      [facility]

## 2021-12-16 NOTE — H&P (Signed)
Lucilla Lame, MD [address] [place], [place] [address] Phone: [phone] Fax : [phone]  Primary Care Physician:  Burnard Hawthorne, FNP Primary Gastroenterologist:  Dr. Allen Norris  Pre-Procedure History & Physical: HPI:  Jennifer Clayton is a 66 y.o. female is here for a screening colonoscopy.   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 Dwyane Luo office   BREAST LUMPECTOMY Right 05/13/2019   positive   BREAST LUMPECTOMY WITH SENTINEL LYMPH NODE BIOPSY Right 05/13/2019   Procedure: BREAST LUMPECTOMY WITH SENTINEL LYMPH NODE BX;  Surgeon: Robert Bellow, MD;  Location: [facility] ORS;  Service: General;  Laterality: Right;   VAGINAL DELIVERY      Prior to Admission medications   Medication Sig Start Date End Date Taking? Authorizing Provider  albuterol (VENTOLIN HFA) 108 (90 Base) MCG/ACT inhaler Inhale 2 puffs into the lungs every 6 (six) hours as needed for wheezing or shortness of breath. 12/07/21  Yes Blake Divine, MD  amLODipine (NORVASC) 5 MG tablet TAKE 1 TABLET BY MOUTH ONCE DAILY 03/29/21  Yes Burnard Hawthorne, FNP  buPROPion (WELLBUTRIN XL) 300 MG 24 hr tablet TAKE 1 TABLET BY MOUTH DAILY 06/03/21  Yes Burnard Hawthorne, FNP  levothyroxine (SYNTHROID) 100 MCG tablet TAKE 1 TABLET EVERY DAY ON EMPTY STOMACHWITH A GLASS  OF WATER AT LEAST 30-60 [place] BREAKFAST Patient taking differently: Take 100 mcg by mouth daily before breakfast. 12/18/18  Yes Arnett, Yvetta Coder, FNP  Ascorbic Acid (VITAMIN C PO) Take 1 tablet by mouth daily.    [provider]  calcium carbonate (OS-CAL - DOSED IN MG OF ELEMENTAL CALCIUM) 1250 (500 Ca) MG tablet Take 1 tablet by mouth daily.    [provider]  denosumab (PROLIA) 60 MG/ML SOSY injection Inject 60 mg into the skin every 6 (six) months.    [provider]  fluorouracil (EFUDEX) 5 % cream Apply topically 2 (two) times daily. As directed 12/03/20   Laurence Ferrari, [place], MD  letrozole [facility]) 2.5 MG tablet TAKE ONE TABLET BY MOUTH EVERY DAY 06/18/21   Cammie Sickle, MD  Multiple Vitamins-Minerals (MULTIVITAMIN ADULT PO) Take 1 tablet by mouth daily.    [provider]  mupirocin ointment (BACTROBAN) 2 % Apply to skin qd-bid 03/31/20   Ralene Bathe, MD  omeprazole (PRILOSEC) 20 MG capsule Take 1 capsule (20 mg total) by mouth daily. 12/01/21   Burnard Hawthorne, FNP  rosuvastatin (CRESTOR) 5 MG tablet Take 1 tablet (5 mg total) by mouth every evening. 12/15/21   Burnard Hawthorne, FNP  SUMAtriptan (IMITREX) 50 MG tablet TAKE 1 TABLET ONCE MAY REPEAT IN 2 HOURSIF NECESSARY 03/29/21   Burnard Hawthorne, FNP  zolpidem (AMBIEN) 5 MG tablet  TAKE 1 TABLET BY MOUTH NIGHTLY 10/12/21   Burnard Hawthorne, FNP    Allergies as of 08/16/2021 - Review Complete 08/16/2021  Allergen Reaction Noted   Zomepirac Anaphylaxis 11/27/2013   Amoxicillin-pot clavulanate Other (See Comments) 11/27/2013   Other Other (See Comments) 11/27/2013    Family History  Problem Relation Age of Onset   Cancer Mother        lung, non-smoker   Dementia Father        related to traumatic brain injury   Breast cancer Neg Hx     Social History   Socioeconomic History   Marital status: Married    Spouse name: Not on file   Number of children: Not on file   Years  of education: Not on file   Highest education level: Not on file  Occupational History   Not on file  Tobacco Use   Smoking status: Never   Smokeless tobacco: Never  Vaping Use   Vaping Use: Never used  Substance and Sexual Activity   Alcohol use: Yes    Alcohol/week: 1.0 standard drink of alcohol    Types: 1 Glasses of wine per week    Comment: rare   Drug use: Never   Sexual activity: Not on file  Other Topics Concern   Not on file  Social History Narrative   Lives in [place] with husband and son and 4 grandchildren.      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       Never smoked; social alcohol.       Social Determinants of Health   Financial Resource Strain: Low Risk  (03/16/2021)   Overall Financial Resource Strain (CARDIA)    Difficulty of Paying Living Expenses: Not hard at all  Food Insecurity: No Food Insecurity (03/16/2021)   Hunger Vital Sign    Worried About Running Out of Food in the Last Year: Never true    Ran Out of Food in the Last Year: Never true  Transportation Needs: No Transportation Needs (03/16/2021)   PRAPARE - THydrologist(Medical): No    Lack of Transportation (Non-Medical): No  Physical Activity: Sufficiently Active (03/16/2021)   Exercise Vital Sign    Days of Exercise per Week: 5 days    Minutes of Exercise per Session: 60 min  Stress: No Stress Concern Present (03/16/2021)   [place] [place]   Feeling of Stress : Not at all  Social Connections: Unknown (03/16/2021)   Social Connection and Isolation Panel [NHANES]    Frequency of Communication with Friends and Family: More than three times a week    Frequency of Social Gatherings with Friends and Family: More than three times a week    Attends Religious Services: Not on file    Active  Member of Clubs or Organizations: Not on file    Attends CArchivistMeetings: Not on file    Marital Status: Not on file  Intimate Partner Violence: Not At Risk (03/16/2021)   Humiliation, Afraid, Rape, and Kick questionnaire    Fear of Current or Ex-Partner: No    Emotionally Abused: No    Physically Abused: No    Sexually Abused: No    Review of Systems: See HPI, otherwise negative ROS  Physical  Exam: BP 131/85   Pulse 83   Temp (!) 97.3 F (36.3 C) (Temporal)   Resp 16   Ht '5\' 3"'$  (1.6 m)   Wt 59 kg   SpO2 98%   BMI 23.03 kg/m  General:   Alert,  pleasant and cooperative in NAD Head:  Normocephalic and atraumatic. Neck:  Supple; no masses or thyromegaly. Lungs:  Clear throughout to auscultation.    Heart:  Regular rate and rhythm. Abdomen:  Soft, nontender and nondistended. Normal bowel sounds, without guarding, and without rebound.   Neurologic:  Alert and  oriented x4;  grossly normal neurologically.  Impression/Plan: Jennifer Clayton is now here to undergo a screening colonoscopy.  Risks, benefits, and alternatives regarding colonoscopy have been reviewed with the patient.  Questions have been answered.  All parties agreeable.

## 2021-12-16 NOTE — Transfer of Care (Signed)
Immediate Anesthesia Transfer of Care Note  Patient: Jennifer Clayton  Procedure(s) Performed: COLONOSCOPY WITH PROPOFOL  Patient Location: PACU  Anesthesia Type:General  Level of Consciousness: sedated  Airway & Oxygen Therapy: Patient Spontanous Breathing  Post-op Assessment: Report given to RN and Post -op Vital signs reviewed and stable  Post vital signs: Reviewed and stable  Last Vitals:  Vitals Value Taken Time  BP 94/61 12/16/21 0749  Temp    Pulse 71 12/16/21 0749  Resp 17 12/16/21 0749  SpO2 98 % 12/16/21 0749  Vitals shown include unvalidated device data.  Last Pain:  Vitals:   12/16/21 0706  TempSrc: Temporal  PainSc: 0-No pain         Complications: No notable events documented.

## 2021-12-16 NOTE — Anesthesia Postprocedure Evaluation (Signed)
Anesthesia Post Note  Patient: Jennifer Clayton  Procedure(s) Performed: COLONOSCOPY WITH PROPOFOL  Patient location during evaluation: Endoscopy Anesthesia Type: General Level of consciousness: awake and alert Pain management: pain level controlled Vital Signs Assessment: post-procedure vital signs reviewed and stable Respiratory status: spontaneous breathing, nonlabored ventilation, respiratory function stable and patient connected to nasal cannula oxygen Cardiovascular status: blood pressure returned to baseline and stable Postop Assessment: no apparent nausea or vomiting Anesthetic complications: no   No notable events documented.   Last Vitals:  Vitals:   12/16/21 0706 12/16/21 0750  BP: 131/85 94/61  Pulse: 83   Resp: 16   Temp: (!) 36.3 C   SpO2: 98%     Last Pain:  Vitals:   12/16/21 0800  TempSrc:   PainSc: 0-No pain                 Ilene Qua

## 2021-12-16 NOTE — Anesthesia Preprocedure Evaluation (Addendum)
Anesthesia Evaluation  Patient identified by MRN, date of birth, ID band Patient awake    Reviewed: Allergy & Precautions, NPO status , Patient's Chart, lab work & pertinent test results  History of Anesthesia Complications Negative for: history of anesthetic complications  Airway Mallampati: III  TM Distance: >3 FB Neck ROM: full    Dental  (+) Teeth Intact   Pulmonary asthma    Pulmonary exam normal        Cardiovascular hypertension, On Medications Normal cardiovascular exam     Neuro/Psych  Headaches  negative psych ROS   GI/Hepatic Neg liver ROS,GERD  ,,  Endo/Other  Hypothyroidism    Renal/GU negative Renal ROS  negative genitourinary   Musculoskeletal   Abdominal   Peds  Hematology negative hematology ROS (+)   Anesthesia Other Findings Past Medical History: 04/2019: Breast cancer ([place])     Comment:  right breast No date: Cancer ([place])     Comment:  breast cancer No date: Dyspnea No date: H/O radioactive iodine thyroid ablation No date: Headache     Comment:  MIGRAINES 05/30/2018: History of actinic keratosis     Comment:  right posterior shoulder 03/31/2020: History of actinic keratosis     Comment:  right forearm 10/19/2012: History of squamous cell carcinoma     Comment:  right anterior lower leg / excised 10/31/2012 No date: Hypertension No date: Hypothyroidism No date: Personal history of radiation therapy  Past Surgical History: No date: ABDOMINAL HYSTERECTOMY     Comment:  Age 44, for bleeding and cyst; NO ovaries 04/25/2019: BREAST BIOPSY; Right     Comment:  positive/ done in Dr. Dwyane Luo office 05/13/2019: BREAST LUMPECTOMY; Right     Comment:  positive 05/13/2019: BREAST LUMPECTOMY WITH SENTINEL LYMPH NODE BIOPSY; Right     Comment:  Procedure: BREAST LUMPECTOMY WITH SENTINEL LYMPH NODE               BX;  Surgeon: Robert Bellow, MD;  Location: [facility]               ORS;   Service: General;  Laterality: Right; No date: VAGINAL DELIVERY     Reproductive/Obstetrics negative OB ROS                             Anesthesia Physical Anesthesia Plan  ASA: 2  Anesthesia Plan: General   Post-op Pain Management: Minimal or no pain anticipated   Induction: Intravenous  PONV Risk Score and Plan: Propofol infusion and TIVA  Airway Management Planned: Natural Airway and Nasal Cannula  Additional Equipment:   Intra-op Plan:   Post-operative Plan:   Informed Consent: I have reviewed the patients History and Physical, chart, labs and discussed the procedure including the risks, benefits and alternatives for the proposed anesthesia with the patient or authorized representative who has indicated his/her understanding and acceptance.     Dental Advisory Given  Plan Discussed with: Anesthesiologist, CRNA and Surgeon  Anesthesia Plan Comments: (Patient consented for risks of anesthesia including but not limited to:  - adverse reactions to medications - risk of airway placement if required - damage to eyes, teeth, lips or other oral mucosa - nerve damage due to positioning  - sore throat or hoarseness - Damage to heart, brain, nerves, lungs, other parts of body or loss of life  Patient voiced understanding.)       Anesthesia Quick Evaluation

## 2021-12-17 ENCOUNTER — Encounter: Payer: Self-pay | Admitting: Gastroenterology

## 2021-12-18 ENCOUNTER — Other Ambulatory Visit: Payer: Self-pay | Admitting: Family

## 2021-12-18 DIAGNOSIS — R635 Abnormal weight gain: Secondary | ICD-10-CM

## 2022-01-17 ENCOUNTER — Other Ambulatory Visit: Payer: Self-pay | Admitting: Dermatology

## 2022-02-10 ENCOUNTER — Encounter: Payer: Self-pay | Admitting: Dermatology

## 2022-02-10 ENCOUNTER — Ambulatory Visit: Payer: PPO | Admitting: Dermatology

## 2022-02-10 VITALS — BP 151/82 | HR 80

## 2022-02-10 DIAGNOSIS — C4491 Basal cell carcinoma of skin, unspecified: Secondary | ICD-10-CM

## 2022-02-10 DIAGNOSIS — D492 Neoplasm of unspecified behavior of bone, soft tissue, and skin: Secondary | ICD-10-CM

## 2022-02-10 DIAGNOSIS — L570 Actinic keratosis: Secondary | ICD-10-CM | POA: Diagnosis not present

## 2022-02-10 DIAGNOSIS — D485 Neoplasm of uncertain behavior of skin: Secondary | ICD-10-CM

## 2022-02-10 DIAGNOSIS — C441192 Basal cell carcinoma of skin of left lower eyelid, including canthus: Secondary | ICD-10-CM | POA: Diagnosis not present

## 2022-02-10 DIAGNOSIS — L719 Rosacea, unspecified: Secondary | ICD-10-CM | POA: Diagnosis not present

## 2022-02-10 HISTORY — DX: Basal cell carcinoma of skin, unspecified: C44.91

## 2022-02-10 MED ORDER — FLUOROURACIL 5 % EX CREA: TOPICAL_CREAM | CUTANEOUS | 1 refills | Status: AC

## 2022-02-10 MED ORDER — IVERMECTIN 1 % EX CREA: TOPICAL_CREAM | CUTANEOUS | 3 refills | Status: DC

## 2022-02-10 NOTE — Progress Notes (Addendum)
Follow-Up Visit   Subjective  Jennifer Clayton is a 67 y.o. female who presents for the following: Annual Exam (Hx of SCC. Hx of AK's ) and Rosacea (Face. Needs refills of Soolantra, controls well).  The patient presents for Total-Body Skin Exam (TBSE) for skin cancer screening and mole check.  The patient has spots, moles and lesions to be evaluated, some may be new or changing and the patient has concerns that these could be cancer.   The following portions of the chart were reviewed this encounter and updated as appropriate:  Tobacco  Allergies  Meds  Problems  Med Hx  Surg Hx  Fam Hx      Review of Systems: No other skin or systemic complaints except as noted in HPI or Assessment and Plan.   Objective  Well appearing patient in no apparent distress; mood and affect are within normal limits.  A full examination was performed including scalp, head, eyes, ears, nose, lips, neck, chest, axillae, abdomen, back, buttocks, bilateral upper extremities, bilateral lower extremities, hands, feet, fingers, toes, fingernails, and toenails. All findings within normal limits unless otherwise noted below.  face Mild macular erythema  Left Dorsal Hand x1, L-2 finger x1, left pretibia x1, right lateral calf x1, right dorsal hand x1 (5) Erythematous thin papules/macules with gritty scale.   Left clavicle 0.3 cm thin pink papule       Left Lower Eyelid 0.4 cm skin colored papule        Assessment & Plan   History of Squamous Cell Carcinoma of the Skin. Anterior right lower leg. 10/31/2012. - No evidence of recurrence today - No lymphadenopathy - Recommend regular full body skin exams - Recommend daily broad spectrum sunscreen SPF 30+ to sun-exposed areas, reapply every 2 hours as needed.  - Call if any new or changing lesions are noted between office visits  Lentigines - Scattered tan macules - Due to sun exposure - Benign-appearing, observe - Recommend daily broad  spectrum sunscreen SPF 30+ to sun-exposed areas, reapply every 2 hours as needed. - Call for any changes  Seborrheic Keratoses - Stuck-on, waxy, tan-brown papules and/or plaques  - Benign-appearing - Discussed benign etiology and prognosis. - Observe - Call for any changes  Melanocytic Nevi - Tan-brown and/or pink-flesh-colored symmetric macules and papules - Benign appearing on exam today - Observation - Call clinic for new or changing moles - Recommend daily use of broad spectrum spf 30+ sunscreen to sun-exposed areas.   Hemangiomas - Red papules - Discussed benign nature - Observe - Call for any changes  Actinic Damage with PreCancerous Actinic Keratoses Counseling for Topical Chemotherapy Management: Patient exhibits: - Severe, confluent actinic changes with pre-cancerous actinic keratoses that is secondary to cumulative UV radiation exposure over time - Condition that is severe; chronic, not at goal. - diffuse scaly erythematous macules and papules with underlying dyspigmentation - Discussed Prescription "Field Treatment" topical Chemotherapy for Severe, Chronic Confluent Actinic Changes with Pre-Cancerous Actinic Keratoses Field treatment involves treatment of an entire area of skin that has confluent Actinic Changes (Sun/ Ultraviolet light damage) and PreCancerous Actinic Keratoses by method of PhotoDynamic Therapy (PDT) and/or prescription Topical Chemotherapy agents such as 5-fluorouracil, 5-fluorouracil/calcipotriene, and/or imiquimod.  The purpose is to decrease the number of clinically evident and subclinical PreCancerous lesions to prevent progression to development of skin cancer by chemically destroying early precancer changes that may or may not be visible.  It has been shown to reduce the risk of developing skin cancer  in the treated area. As a result of treatment, redness, scaling, crusting, and open sores may occur during treatment course. One or more than one of  these methods may be used and may have to be used several times to control, suppress and eliminate the PreCancerous changes. Discussed treatment course, expected reaction, and possible side effects. - Recommend daily broad spectrum sunscreen SPF 30+ to sun-exposed areas, reapply every 2 hours as needed.  - Staying in the shade or wearing long sleeves, sun glasses (UVA+UVB protection) and wide brim hats (4-inch brim around the entire circumference of the hat) are also recommended. - Call for new or changing lesions. - Start 5-fluorouracil cream twice a day for 7 days to affected areas including right forehead, right upper lip, left temple. Prescription sent to [facility]. Patient provided with handout reviewing treatment course and side effects and advised to call or message [place] on MyChart with any concerns.  Reviewed course of treatment and expected reaction.  Patient advised to expect inflammation and crusting and advised that erosions are possible.  Patient advised to be diligent with sun protection during and after treatment. Counseled to keep medication out of reach of children and pets.   Skin cancer screening performed today.  Rosacea face  Chronic condition with duration or expected duration over one year. Currently well-controlled.  Rosacea is a chronic progressive skin condition usually affecting the face of adults, causing redness and/or acne bumps. It is treatable but not curable. It sometimes affects the eyes (ocular rosacea) as well. It may respond to topical and/or systemic medication and can flare with stress, sun exposure, alcohol, exercise, topical steroids (including hydrocortisone/cortisone 10) and some foods.  Daily application of broad spectrum spf 30+ sunscreen to face is recommended to reduce flares.  Continue Soolantra daily to face  Related Medications Ivermectin (SOOLANTRA) 1 % CREA APPLY THIN LAYER TO FACE DAILY  AK (actinic keratosis) (5) Left Dorsal Hand x1, L-2  finger x1, left pretibia x1, right lateral calf x1, right dorsal hand x1  Actinic keratoses are precancerous spots that appear secondary to cumulative UV radiation exposure/sun exposure over time. They are chronic with expected duration over 1 year. A portion of actinic keratoses will progress to squamous cell carcinoma of the skin. It is not possible to reliably predict which spots will progress to skin cancer and so treatment is recommended to prevent development of skin cancer.  Recommend daily broad spectrum sunscreen SPF 30+ to sun-exposed areas, reapply every 2 hours as needed.  Recommend staying in the shade or wearing long sleeves, sun glasses (UVA+UVB protection) and wide brim hats (4-inch brim around the entire circumference of the hat). Call for new or changing lesions.  Destruction of lesion - Left Dorsal Hand x1, L-2 finger x1, left pretibia x1, right lateral calf x1, right dorsal hand x1  Destruction method: cryotherapy   Informed consent: discussed and consent obtained   Lesion destroyed using liquid nitrogen: Yes   Region frozen until ice ball extended beyond lesion: Yes   Outcome: patient tolerated procedure well with no complications   Post-procedure details: wound care instructions given   Additional details:  Prior to procedure, discussed risks of blister formation, small wound, skin dyspigmentation, or rare scar following cryotherapy. Recommend Vaseline ointment to treated areas while healing.   Neoplasm of skin (2) Left clavicle  Epidermal / dermal shaving  Lesion diameter (cm):  0.3 Informed consent: discussed and consent obtained   Patient was prepped and draped in usual sterile fashion: Area  prepped with alcohol. Anesthesia: the lesion was anesthetized in a standard fashion   Anesthetic:  1% lidocaine w/ epinephrine 1-100,000 buffered w/ 8.4% NaHCO3 Instrument used: flexible razor blade   Hemostasis achieved with: pressure, aluminum chloride and  electrodesiccation   Outcome: patient tolerated procedure well   Post-procedure details: wound care instructions given   Post-procedure details comment:  Ointment and small bandage applied  Destruction of lesion  Destruction method: electrodesiccation and curettage   Informed consent: discussed and consent obtained   Timeout:  patient name, date of birth, surgical site, and procedure verified Anesthesia: the lesion was anesthetized in a standard fashion   Anesthetic:  1% lidocaine w/ epinephrine 1-100,000 buffered w/ 8.4% NaHCO3 Curettage performed in three different directions: Yes   Electrodesiccation performed over the curetted area: Yes   Curettage cycles:  3 Final wound size (cm):  0.6 Hemostasis achieved with:  electrodesiccation Outcome: patient tolerated procedure well with no complications   Post-procedure details: sterile dressing applied and wound care instructions given   Dressing type: petrolatum    Specimen 1 - Surgical pathology Differential Diagnosis: R/O BCC  Check Margins: No  Left Lower Eyelid  Skin / nail biopsy Type of biopsy: tangential   Informed consent: discussed and consent obtained   Anesthesia: the lesion was anesthetized in a standard fashion   Anesthesia comment:  Area prepped with alcohol Anesthetic:  1% lidocaine w/ epinephrine 1-100,000 buffered w/ 8.4% NaHCO3 Instrument used: flexible razor blade   Hemostasis achieved with: pressure, aluminum chloride and electrodesiccation   Outcome: patient tolerated procedure well   Post-procedure details: wound care instructions given   Post-procedure details comment:  Ointment and small bandage applied  Specimen 2 - Surgical pathology Differential Diagnosis: R/O BCC  Check Margins: No  2 small specimens in bottle  Plan Mohs with Dr. Manley Mason if left lower eyelid +skin cancer.  Related Medications mupirocin ointment (BACTROBAN) 2 % Apply to skin qd-bid   Return in about 1 year (around  02/11/2023) for TBSE.   I, Emelia Salisbury, CMA, am acting as scribe for Forest Gleason, MD.  Documentation: I have reviewed the above documentation for accuracy and completeness, and I agree with the above.  Forest Gleason, MD

## 2022-02-10 NOTE — Patient Instructions (Addendum)
Cryotherapy Aftercare  Wash gently with soap and water everyday.   Apply Vaseline and Band-Aid daily until healed.   Wound Care Instructions  Cleanse wound gently with soap and water once a day then pat dry with clean gauze. Apply a thin coat of Petrolatum (petroleum jelly, "Vaseline") over the wound (unless you have an allergy to this). We recommend that you use a new, sterile tube of Vaseline. Do not pick or remove scabs. Do not remove the yellow or white "healing tissue" from the base of the wound.  Cover the wound with fresh, clean, nonstick gauze and secure with paper tape. You may use Band-Aids in place of gauze and tape if the wound is small enough, but would recommend trimming much of the tape off as there is often too much. Sometimes Band-Aids can irritate the skin.  You should call the office for your biopsy report after 1 week if you have not already been contacted.  If you experience any problems, such as abnormal amounts of bleeding, swelling, significant bruising, significant pain, or evidence of infection, please call the office immediately.  FOR ADULT SURGERY PATIENTS: If you need something for pain relief you may take 1 extra strength Tylenol (acetaminophen) AND 2 Ibuprofen ('200mg'$  each) together every 4 hours as needed for pain. (do not take these if you are allergic to them or if you have a reason you should not take them.) Typically, you may only need pain medication for 1 to 3 days.     - Start 5-fluorouracil cream twice a day for 7 days to affected areas including right forehead, right upper lip, left temple. Prescription sent to [facility]. Patient provided with handout reviewing treatment course and side effects and advised to call or message [place] on MyChart with any concerns.  Reviewed course of treatment and expected reaction.  Patient advised to expect inflammation and crusting and advised that erosions are possible.  Patient advised to be diligent with sun protection  during and after treatment. Counseled to keep medication out of reach of children and pets.   Your prescription was sent to [facility] in [place]. A representative from [employer] [facility] will contact you within 2 business hours to verify your address and insurance information to schedule a free delivery. If for any reason you do not receive a phone call from them, please reach out to them. Their phone number is [phone] and their hours are Monday-Friday 9:00 am-5:00 pm.      Recommend daily broad spectrum sunscreen SPF 30+ to sun-exposed areas, reapply every 2 hours as needed. Call for new or changing lesions.  Staying in the shade or wearing long sleeves, sun glasses (UVA+UVB protection) and wide brim hats (4-inch brim around the entire circumference of the hat) are also recommended for sun protection.    Recommend taking Heliocare sun protection supplement daily in sunny weather for additional sun protection. For maximum protection on the sunniest days, you can take up to 2 capsules of regular Heliocare OR take 1 capsule of Heliocare Ultra. For prolonged exposure (such as a full day in the sun), you can repeat your dose of the supplement 4 hours after your first dose. Heliocare can be purchased at [employer], at some [facility] or at [email or web address].    Melanoma ABCDEs  Melanoma is the most dangerous type of skin cancer, and is the leading cause of death from skin disease.  You are more likely to develop melanoma if you: Have light-colored skin, light-colored eyes, or red  or blond hair Spend a lot of time in the sun Tan regularly, either outdoors or in a tanning bed Have had blistering sunburns, especially during childhood Have a close family member who has had a melanoma Have atypical moles or large birthmarks  Early detection of melanoma is key since treatment is typically straightforward and cure rates are extremely high if we catch it early.   The first sign of  melanoma is often a change in a mole or a new dark spot.  The ABCDE system is a way of remembering the signs of melanoma.  A for asymmetry:  The two halves do not match. B for border:  The edges of the growth are irregular. C for color:  A mixture of colors are present instead of an even brown color. D for diameter:  Melanomas are usually (but not always) greater than 64m - the size of a pencil eraser. E for evolution:  The spot keeps changing in size, shape, and color.  Please check your skin once per month between visits. You can use a small mirror in front and a large mirror behind you to keep an eye on the back side or your body.   If you see any new or changing lesions before your next follow-up, please call to schedule a visit.  Please continue daily skin protection including broad spectrum sunscreen SPF 30+ to sun-exposed areas, reapplying every 2 hours as needed when you're outdoors.   Staying in the shade or wearing long sleeves, sun glasses (UVA+UVB protection) and wide brim hats (4-inch brim around the entire circumference of the hat) are also recommended for sun protection.    Due to recent changes in healthcare laws, you may see results of your pathology and/or laboratory studies on MyChart before the doctors have had a chance to review them. We understand that in some cases there may be results that are confusing or concerning to you. Please understand that not all results are received at the same time and often the doctors may need to interpret multiple results in order to provide you with the best plan of care or course of treatment. Therefore, we ask that you please give u[place]2 business days to thoroughly review all your results before contacting the office for clarification. Should we see a critical lab result, you will be contacted sooner.   If You Need Anything After Your Visit  If you have any questions or concerns for your doctor, please call our main line at [phone]  press option 4 to reach your doctor's medical assistant. If no one answers, please leave a voicemail as directed and we will return your call as soon as possible. Messages left after 4 pm will be answered the following business day.   You may also send u[place] message via [facility] We typically respond to MyChart messages within 1-2 business days.  For prescription refills, please ask your pharmacy to contact our office. Our fax number is [phone]  If you have an urgent issue when the clinic is closed that cannot wait until the next business day, you can page your doctor at the number below.    Please note that while we do our best to be available for urgent issues outside of office hours, we are not available 24/7.   If you have an urgent issue and are unable to reach u[place] you may choose to seek medical care at your doctor's office, retail clinic, urgent care center, or emergency room.  If you have a medical emergency, please immediately call 911 or go to the emergency department.  Pager Numbers  - Dr. Nehemiah Massed: [phone]  - Dr. Laurence Ferrari: [phone]  - Dr. Nicole Kindred: [phone]  In the event of inclement weather, please call our main line at [phone] for an update on the status of any delays or closures.  Dermatology Medication Tips: Please keep the boxes that topical medications come in in order to help keep track of the instructions about where and how to use these. Pharmacies typically print the medication instructions only on the boxes and not directly on the medication tubes.   If your medication is too expensive, please contact our office at [phone] option 4 or send [place] a message through [facility].   We are unable to tell what your co-pay for medications will be in advance as this is different depending on your insurance coverage. However, we may be able to find a substitute medication at lower cost or fill out paperwork to get insurance to cover a needed medication.   If  a prior authorization is required to get your medication covered by your insurance company, please allow [place] 1-2 business days to complete this process.  Drug prices often vary depending on where the prescription is filled and some pharmacies may offer cheaper prices.  The website www.goodrx.com contains coupons for medications through different pharmacies. The prices here do not account for what the cost may be with help from insurance (it may be cheaper with your insurance), but the website can give you the price if you did not use any insurance.  - You can print the associated coupon and take it with your prescription to the pharmacy.  - You may also stop by our office during regular business hours and pick up a GoodRx coupon card.  - If you need your prescription sent electronically to a different pharmacy, notify our office through [facility] or by phone at [phone] option 4.     Si Usted Necesita Algo Despus de Su Visita  Tambin puede enviarnos un mensaje a travs de Pharmacist, community. Por lo general respondemos a los mensajes de MyChart en el transcurso de 1 a 2 das hbiles.  Para renovar recetas, por favor pida a su farmacia que se ponga en contacto con nuestra oficina. Harland Dingwall de fax es [place] [phone].  Si tiene un asunto urgente cuando la clnica est cerrada y que no puede esperar hasta el siguiente da hbil, puede llamar/localizar a su doctor(a) al nmero que aparece a continuacin.   Por favor, tenga en cuenta que aunque hacemos todo lo posible para estar disponibles para asuntos urgentes fuera del horario de [place], no estamos disponibles las 24 horas del da, los 7 das de la [place].   Si tiene un problema urgente y no puede comunicarse con nosotros, puede optar por buscar atencin mdica  en el consultorio de su doctor(a), en una clnica privada, en un centro de atencin urgente o en una sala de emergencias.  Si tiene Engineering geologist, por favor llame  inmediatamente al 911 o vaya a la sala de emergencias.  Nmeros de bper  - Dr. Nehemiah Massed: [phone]  - Dra. Moye: [phone]  - Dra. Nicole Kindred: [phone]  En caso de inclemencias del [place], por favor llame a Johnsie Kindred principal al [phone] para una actualizacin sobre el [place] de cualquier retraso o cierre.  Consejos para la medicacin en dermatologa: Por favor, guarde las cajas en las que vienen los medicamentos de  uso tpico para ayudarle a seguir las [employer] dnde y cmo usarlos. Las farmacias generalmente imprimen las instrucciones del medicamento slo en las cajas y no directamente en los tubos del [place].   Si su medicamento es muy caro, por favor, pngase en contacto con Zigmund Daniel llamando al [phone] y presione la opcin 4 o envenos un mensaje a travs de Pharmacist, community.   No podemos decirle cul ser su copago por los medicamentos por adelantado ya que esto es diferente dependiendo de la cobertura de su seguro. Sin embargo, es posible que podamos encontrar un medicamento sustituto a Electrical engineer un formulario para que el seguro cubra el medicamento que se considera necesario.   Si se requiere una autorizacin previa para que su compaa de seguros [place] su medicamento, por favor permtanos de 1 a 2 das hbiles para completar este proceso.  Los precios de los medicamentos varan con frecuencia dependiendo del Environmental consultant de dnde se surte la receta y alguna farmacias pueden ofrecer precios ms baratos.  El sitio web www.goodrx.com tiene cupones para medicamentos de Airline pilot. Los precios aqu no tienen en cuenta lo que podra costar con la ayuda del seguro (puede ser ms barato con su seguro), pero el sitio web puede darle el precio si no utiliz Research scientist (physical sciences).  - Puede imprimir el cupn correspondiente y llevarlo con su receta a la farmacia.  - Tambin puede pasar por nuestra oficina durante el horario de atencin regular y Charity fundraiser  una tarjeta de cupones de GoodRx.  - Si necesita que su receta se enve electrnicamente a una farmacia diferente, informe a nuestra oficina a travs de MyChart de [place] o por telfono llamando al [phone] y presione la opcin 4.

## 2022-02-15 ENCOUNTER — Encounter: Payer: Self-pay | Admitting: Dermatology

## 2022-02-15 ENCOUNTER — Telehealth: Payer: Self-pay

## 2022-02-15 DIAGNOSIS — C441192 Basal cell carcinoma of skin of left lower eyelid, including canthus: Secondary | ICD-10-CM

## 2022-02-15 NOTE — Telephone Encounter (Signed)
Discussed pathology results. Patient voiced understanding. TBSE appt scheduled, referral sent to Dr. Manley Mason.

## 2022-02-15 NOTE — Telephone Encounter (Signed)
-----  Message from Alfonso Patten, MD sent at 02/15/2022  4:29 PM EST ----- 1. Skin , left clavicle ACTINIC KERATOSIS --> already treated with ED&C  2. Skin , left lower eyelid BASAL CELL CARCINOMA, NODULAR PATTERN --> Mohs with Dr. Manley Mason ONLY (not another Mohs surgeon) at [facility]  MAs please call with results and refer. Please let me know if she has any questions. Thank you!

## 2022-02-17 ENCOUNTER — Encounter: Payer: Self-pay | Admitting: Family

## 2022-03-03 ENCOUNTER — Telehealth: Payer: Self-pay

## 2022-03-03 DIAGNOSIS — D492 Neoplasm of unspecified behavior of bone, soft tissue, and skin: Secondary | ICD-10-CM

## 2022-03-03 DIAGNOSIS — L244 Irritant contact dermatitis due to drugs in contact with skin: Secondary | ICD-10-CM

## 2022-03-03 DIAGNOSIS — K120 Recurrent oral aphthae: Secondary | ICD-10-CM

## 2022-03-03 MED ORDER — HYDROCORTISONE 2.5 % EX OINT: TOPICAL_OINTMENT | CUTANEOUS | 1 refills | Status: DC

## 2022-03-03 MED ORDER — TRIAMCINOLONE ACETONIDE 0.1 % MT PSTE: PASTE | OROMUCOSAL | 2 refills | Status: AC

## 2022-03-03 MED ORDER — MUPIROCIN 2 % EX OINT: TOPICAL_OINTMENT | CUTANEOUS | 1 refills | Status: AC

## 2022-03-03 NOTE — Telephone Encounter (Signed)
Yes please send [place] dental paste. Advise to be sure the area is very dry when she applies it so it will stay in place and work.   For lips, start hydrocortisone 2.5% ointment twice a day for up to 2 weeks. Thank you!

## 2022-03-03 NOTE — Telephone Encounter (Signed)
Please Advise:  Has ulcers in mouth. Would like Rx of Triamcinolone dental paste. Okay to send in?  Lips are very irritated and inflamed secondary to 5FU/Calcipotriene treatment. What would you like to do for this?  Patient requesting Rf of Mupirocin. Sent to pharmacy.

## 2022-03-03 NOTE — Telephone Encounter (Signed)
Prescriptions sent. Patient advised.

## 2022-03-04 ENCOUNTER — Ambulatory Visit: Payer: PPO | Admitting: Family

## 2022-03-08 ENCOUNTER — Other Ambulatory Visit: Payer: Self-pay | Admitting: Family

## 2022-03-08 DIAGNOSIS — G4700 Insomnia, unspecified: Secondary | ICD-10-CM

## 2022-03-08 NOTE — Telephone Encounter (Signed)
Requesting: Zolpidem Contract: No UDS: NO Last Visit: 12/01/2021 Next Visit: 05/06/2022 Last Refill: 10/12/2021  Please Advise

## 2022-03-09 ENCOUNTER — Telehealth: Payer: Self-pay | Admitting: Family

## 2022-03-09 NOTE — Telephone Encounter (Signed)
Left message for patient to call back and schedule Medicare Annual Wellness Visit (AWV).   Please offer to do by telephone.  Left office number and my jabber #[phone].  Last AWV:03/16/2021   Please schedule at anytime with Nurse Health Advisor.  

## 2022-03-14 ENCOUNTER — Other Ambulatory Visit: Payer: Self-pay | Admitting: Internal Medicine

## 2022-03-14 ENCOUNTER — Telehealth: Payer: Self-pay | Admitting: Family

## 2022-03-14 NOTE — Telephone Encounter (Signed)
Left message for patient to call back and schedule Medicare Annual Wellness Visit (AWV).   Please offer to do by telephone.  Left office number and my jabber [phone].  Last AWV:03/16/2021   Please schedule at anytime with Nurse Health Advisor.

## 2022-03-21 ENCOUNTER — Other Ambulatory Visit: Payer: Self-pay | Admitting: Family

## 2022-03-21 DIAGNOSIS — Z1231 Encounter for screening mammogram for malignant neoplasm of breast: Secondary | ICD-10-CM

## 2022-03-21 DIAGNOSIS — Z853 Personal history of malignant neoplasm of breast: Secondary | ICD-10-CM

## 2022-03-29 ENCOUNTER — Telehealth: Payer: Self-pay | Admitting: Family

## 2022-03-29 ENCOUNTER — Other Ambulatory Visit: Payer: Self-pay

## 2022-03-29 MED ORDER — AMLODIPINE BESYLATE 5 MG PO TABS: 5.0000 mg | ORAL_TABLET | Freq: Every day | ORAL | 3 refills | Status: DC

## 2022-03-29 NOTE — Telephone Encounter (Signed)
sent 

## 2022-03-29 NOTE — Telephone Encounter (Signed)
Prescription Request  03/29/2022  Is this a "Controlled Substance" medicine? No  LOV: 12/01/2021  What is the name of the medication or equipment? amLODipine (NORVASC) 5 MG tablet  Have you contacted your pharmacy to request a refill? Yes   Which pharmacy would you like this sent to?   [facility] - [place], [place] - [address] [address] Phone: [phone] Fax: [phone]     Patient notified that their request is being sent to the clinical staff for review and that they should receive a response within 2 business days.   Please advise at [facility] [phone]

## 2022-04-06 ENCOUNTER — Telehealth: Payer: Self-pay | Admitting: Family

## 2022-04-06 NOTE — Telephone Encounter (Signed)
Contacted Jennifer Clayton to schedule their annual wellness visit. Appointment made for 04/14/2022.  Thank you,  [place] Direct dial  [phone]

## 2022-04-12 ENCOUNTER — Inpatient Hospital Stay: Payer: PPO

## 2022-04-12 ENCOUNTER — Inpatient Hospital Stay: Payer: PPO | Attending: Internal Medicine | Admitting: Internal Medicine

## 2022-04-12 ENCOUNTER — Encounter: Payer: Self-pay | Admitting: Internal Medicine

## 2022-04-12 VITALS — BP 118/70 | HR 70 | Temp 98.4°F | Resp 18 | Wt 133.2 lb

## 2022-04-12 DIAGNOSIS — I10 Essential (primary) hypertension: Secondary | ICD-10-CM | POA: Insufficient documentation

## 2022-04-12 DIAGNOSIS — Z170 Estrogen receptor positive status [ER+]: Secondary | ICD-10-CM | POA: Diagnosis not present

## 2022-04-12 DIAGNOSIS — C50411 Malignant neoplasm of upper-outer quadrant of right female breast: Secondary | ICD-10-CM

## 2022-04-12 DIAGNOSIS — M810 Age-related osteoporosis without current pathological fracture: Secondary | ICD-10-CM | POA: Insufficient documentation

## 2022-04-12 DIAGNOSIS — Z79811 Long term (current) use of aromatase inhibitors: Secondary | ICD-10-CM | POA: Insufficient documentation

## 2022-04-12 LAB — CBC WITH DIFFERENTIAL/PLATELET
Abs Immature Granulocytes: 0.02 10*3/uL (ref 0.00–0.07)
Basophils Absolute: 0 10*3/uL (ref 0.0–0.1)
Basophils Relative: 1 %
Eosinophils Absolute: 0.2 10*3/uL (ref 0.0–0.5)
Eosinophils Relative: 3 %
HCT: 40.1 % (ref 36.0–46.0)
Hemoglobin: 13.1 g/dL (ref 12.0–15.0)
Immature Granulocytes: 0 %
Lymphocytes Relative: 33 %
Lymphs Abs: 1.8 10*3/uL (ref 0.7–4.0)
MCH: 31 pg (ref 26.0–34.0)
MCHC: 32.7 g/dL (ref 30.0–36.0)
MCV: 95 fL (ref 80.0–100.0)
Monocytes Absolute: 0.6 10*3/uL (ref 0.1–1.0)
Monocytes Relative: 10 %
Neutro Abs: 2.9 10*3/uL (ref 1.7–7.7)
Neutrophils Relative %: 53 %
Platelets: 244 10*3/uL (ref 150–400)
RBC: 4.22 MIL/uL (ref 3.87–5.11)
RDW: 11.9 % (ref 11.5–15.5)
WBC: 5.5 10*3/uL (ref 4.0–10.5)
nRBC: 0 % (ref 0.0–0.2)

## 2022-04-12 LAB — BASIC METABOLIC PANEL
Anion gap: 7 (ref 5–15)
BUN: 9 mg/dL (ref 8–23)
CO2: 27 mmol/L (ref 22–32)
Calcium: 8.6 mg/dL — ABNORMAL LOW (ref 8.9–10.3)
Chloride: 100 mmol/L (ref 98–111)
Creatinine, Ser: 0.81 mg/dL (ref 0.44–1.00)
GFR, Estimated: >60 mL/min (ref >60)
Glucose, Bld: 117 mg/dL — ABNORMAL HIGH (ref 70–99)
Potassium: 4.1 mmol/L (ref 3.5–5.1)
Sodium: 134 mmol/L — ABNORMAL LOW (ref 135–145)

## 2022-04-12 MED ORDER — DENOSUMAB 60 MG/ML ~~LOC~~ SOSY
60.0000 mg | PREFILLED_SYRINGE | Freq: Once | SUBCUTANEOUS | Status: AC
  Administered 2022-04-12: 60 mg via SUBCUTANEOUS

## 2022-04-12 NOTE — Progress Notes (Signed)
Has an Appt April 2nd for her Mammogram. No concerns with any breast issues. Taking letrozole without side effects. Does have aching in bilateral LE's. Appetite is fairly good. Energy is good.

## 2022-04-12 NOTE — Assessment & Plan Note (Addendum)
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  On adjuvant Letrozole [until summer 2026]. STABLE; Tolerating well no major side effects noted except for mild joint pains/hot flashes. Mammo-April 2024[[facility]-surgery]- pending.     # joint pains- G-1 sec to letrozole.  Stable.  # Hot flashes- G-1 sec to letrozole. Stable.  # OSTEOPOROSIS [BMD-MARCH 2021]-T score -3; On Prolia [2021]; SEP  2023- T-score of -2.9.  Overall stable.  Continue Vit D 1000/day. Continue calcium plus vitamin D plus exercise program. Continue lifting weights/physical activity.  Will continue prolia for now.  We will repeat bone density again in sep, 2025. Today Ca- 8.5- recommend increasing the ca+vit D BID.   # DISPOSITION: # Prolia today # Follow up in 6 months- MD; labs-cbc; cmp; Vit D 25 OH- prolia SQ; Dr.B

## 2022-04-12 NOTE — Progress Notes (Signed)
[facility] [place] NOTE  Patient Care Team: Burnard Hawthorne, FNP as PCP - General (Family Medicine) Theodore Demark, RN (Inactive) as Oncology Nurse Navigator Noreene Filbert, MD as Radiation Oncologist (Radiation Oncology) Bary Castilla Forest Gleason, MD as Consulting Physician (General Surgery) Cammie Sickle, MD as Consulting Physician (Internal Medicine)  CHIEF COMPLAINTS/PURPOSE OF CONSULTATION: Breast cancer  #  Oncology History Overview Note  # MARCH 2021-right breast 11 o'clock position-s/p lumpectomy; SLNBx [Dr. Tollie Pizza ]pT1c (15 mm) p N0-grade-1; ER/PR positive HER-2 negative; Oncotype low risk [risk of recurrence is 5%]-no chemotherapy; s/p RT [08/12/2019]- Oncotyoe- 5% risk of recurrence. No chemo.   #Mid July 2021-letrozole  #Osteoporosis 2021-Prolia  # SURVIVORSHIP:   # GENETICS:   DIAGNOSIS: Right breast cancer  STAGE:   1      ;  GOALS: Cure  CURRENT/MOST RECENT THERAPY : Letrozole    Carcinoma of upper-outer quadrant of right breast in female, estrogen receptor positive ([place])  05/22/2019 Initial Diagnosis   Carcinoma of upper-outer quadrant of right breast in female, estrogen receptor positive ([place])     HISTORY OF PRESENTING ILLNESS: Alone.  Ambulating independently.  Jennifer Clayton 67 y.o.  female stage I breast cancer ER/PR positive Her-2 negative on letrozole/ prolia is here for follow-up.  Has an Appt April 2nd for her Mammogram. No concerns with any breast issues. Taking letrozole without side effects. . Appetite is fairly good. Energy is good   Mild hot flashes.  Mild chronic joint pains. Does have aching in bilateral LE's. Not any worse.  No shortness of breath.   Review of Systems  Constitutional:  Negative for chills, diaphoresis, fever, malaise/fatigue and weight loss.  HENT:  Negative for nosebleeds and sore throat.   Eyes:  Negative for double vision.  Respiratory:  Negative for cough, hemoptysis, sputum production,  shortness of breath and wheezing.   Cardiovascular:  Negative for chest pain, palpitations, orthopnea and leg swelling.  Gastrointestinal:  Negative for abdominal pain, blood in stool, constipation, diarrhea, heartburn, melena, nausea and vomiting.  Genitourinary:  Negative for dysuria, frequency and urgency.  Musculoskeletal:  Positive for joint pain. Negative for back pain.  Skin: Negative.  Negative for itching and rash.  Neurological:  Negative for dizziness, tingling, focal weakness, weakness and headaches.  Endo/Heme/Allergies:  Does not bruise/bleed easily.  Psychiatric/Behavioral:  Negative for depression. The patient is not nervous/anxious and does not have insomnia.      MEDICAL HISTORY:  Past Medical History:  Diagnosis Date   Basal cell carcinoma 02/10/2022   Left lower eyelid. Nodular. Mohs pending.   Breast cancer ([place]) 04/2019   right breast   Cancer ([place])    breast cancer   Dyspnea    H/O radioactive iodine thyroid ablation    Headache    MIGRAINES   History of actinic keratosis 05/30/2018   right posterior shoulder   History of actinic keratosis 03/31/2020   right forearm   History of squamous cell carcinoma 10/19/2012   right anterior lower leg / excised 10/31/2012   Hypertension    Hypothyroidism    Personal history of radiation therapy     SURGICAL HISTORY: Past Surgical History:  Procedure Laterality Date   ABDOMINAL HYSTERECTOMY     Age 32, for bleeding and cyst; NO ovaries   BREAST BIOPSY Right 04/25/2019   positive/ done in Dr. Dwyane Luo office   BREAST LUMPECTOMY Right 05/13/2019   positive   BREAST LUMPECTOMY WITH SENTINEL LYMPH NODE BIOPSY Right 05/13/2019  Procedure: BREAST LUMPECTOMY WITH SENTINEL LYMPH NODE BX;  Surgeon: Robert Bellow, MD;  Location: [facility] ORS;  Service: General;  Laterality: Right;   COLONOSCOPY WITH PROPOFOL N/A 12/16/2021   Procedure: COLONOSCOPY WITH PROPOFOL;  Surgeon: Lucilla Lame, MD;  Location: [facility] ENDOSCOPY;   Service: Endoscopy;  Laterality: N/A;   VAGINAL DELIVERY      SOCIAL HISTORY: Social History   Socioeconomic History   Marital status: Married    Spouse name: Not on file   Number of children: Not on file   Years of education: Not on file   Highest education level: Not on file  Occupational History   Not on file  Tobacco Use   Smoking status: Never   Smokeless tobacco: Never  Vaping Use   Vaping Use: Never used  Substance and Sexual Activity   Alcohol use: Yes    Alcohol/week: 1.0 standard drink of alcohol    Types: 1 Glasses of wine per week    Comment: rare   Drug use: Never   Sexual activity: Not on file  Other Topics Concern   Not on file  Social History Narrative   Lives in [place] with husband and son and 4 grandchildren.      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       Never smoked; social alcohol.       Social Determinants of Health   Financial Resource Strain: Low Risk  (03/16/2021)   Overall Financial Resource Strain (CARDIA)    Difficulty of Paying Living Expenses: Not hard at all  Food Insecurity: No Food Insecurity (03/16/2021)   Hunger Vital Sign    Worried About Running Out of Food in the Last Year: Never true    Ran Out of Food in the Last Year: Never true  Transportation Needs: No Transportation Needs (03/16/2021)   PRAPARE - THydrologist(Medical): No    Lack of Transportation (Non-Medical): No  Physical Activity: Sufficiently Active (03/16/2021)   Exercise Vital Sign    Days of Exercise per Week: 5 days    Minutes of Exercise per Session: 60 min  Stress: No Stress Concern Present (03/16/2021)   [place]   Feeling of Stress : Not at all  Social Connections: Unknown (03/16/2021)   Social Connection and Isolation Panel [NHANES]     Frequency of Communication with Friends and Family: More than three times a week    Frequency of Social Gatherings with Friends and Family: More than three times a week    Attends Religious Services: Not on file    Active Member of Clubs or Organizations: Not on file    Attends CArchivistMeetings: Not on file    Marital Status: Not on file  Intimate Partner Violence: Not At Risk (03/16/2021)   Humiliation, Afraid, Rape, and Kick questionnaire    Fear of Current or Ex-Partner: No    Emotionally Abused: No    Physically Abused: No    Sexually Abused: No    FAMILY HISTORY: Family History  Problem Relation Age of Onset   Cancer Mother        lung, non-smoker   Dementia Father        related to traumatic brain injury   Breast cancer Neg Hx  ALLERGIES:  is allergic to zomepirac, amoxicillin-pot clavulanate, and other.  MEDICATIONS:  Current Outpatient Medications  Medication Sig Dispense Refill   albuterol (VENTOLIN HFA) 108 (90 Base) MCG/ACT inhaler Inhale 2 puffs into the lungs every 6 (six) hours as needed for wheezing or shortness of breath. 8 g 0   amLODipine (NORVASC) 5 MG tablet Take 1 tablet (5 mg total) by mouth daily. 90 tablet 3   Ascorbic Acid (VITAMIN C PO) Take 1 tablet by mouth daily.     buPROPion (WELLBUTRIN XL) 300 MG 24 hr tablet TAKE 1 TABLET BY MOUTH DAILY 90 tablet 1   calcium carbonate (OS-CAL - DOSED IN MG OF ELEMENTAL CALCIUM) 1250 (500 Ca) MG tablet Take 1 tablet by mouth daily.     denosumab (PROLIA) 60 MG/ML SOSY injection Inject 60 mg into the skin every 6 (six) months.     fluorouracil (EFUDEX) 5 % cream Apply twice daily to right forehead, right upper lip, left temple for 7 days 40 g 1   Ivermectin (SOOLANTRA) 1 % CREA APPLY THIN LAYER TO FACE DAILY 45 g 3   letrozole (FEMARA) 2.5 MG tablet TAKE ONE TABLET BY MOUTH EVERY DAY 90 tablet 3   levothyroxine (SYNTHROID) 100 MCG tablet TAKE 1 TABLET EVERY DAY ON EMPTY STOMACHWITH A GLASS OF WATER  AT LEAST 30-60 MINBEFORE BREAKFAST (Patient taking differently: Take 100 mcg by mouth daily before breakfast.) 90 tablet 0   Multiple Vitamins-Minerals (MULTIVITAMIN ADULT PO) Take 1 tablet by mouth daily.     mupirocin ointment (BACTROBAN) 2 % Apply to skin qd-bid 22 g 1   omeprazole (PRILOSEC) 20 MG capsule Take 1 capsule (20 mg total) by mouth daily. 30 capsule 0   rosuvastatin (CRESTOR) 5 MG tablet Take 1 tablet (5 mg total) by mouth every evening. 90 tablet 3   SUMAtriptan (IMITREX) 50 MG tablet TAKE 1 TABLET ONCE MAY REPEAT IN 2 HOURSIF NECESSARY 15 tablet 2   triamcinolone (KENALOG) 0.1 % paste Apply twice daily to ulcers in mouth. Dry area before application 5 g 2   zolpidem (AMBIEN) 5 MG tablet TAKE 1 TABLET BY MOUTH NIGHTLY 30 tablet 2   No current facility-administered medications for this visit.      Marland Kitchen  PHYSICAL EXAMINATION: ECOG PERFORMANCE STATUS: 0 - Asymptomatic  Vitals:   04/12/22 1034  BP: 118/70  Pulse: 70  Resp: 18  Temp: 98.4 F (36.9 C)  SpO2: 99%    Filed Weights   04/12/22 1034  Weight: 133 lb 3.2 oz (60.4 kg)     Physical Exam HENT:     Head: Normocephalic and atraumatic.     Mouth/Throat:     Pharynx: No oropharyngeal exudate.  Eyes:     Pupils: Pupils are equal, round, and reactive to light.  Cardiovascular:     Rate and Rhythm: Normal rate and regular rhythm.  Pulmonary:     Effort: Pulmonary effort is normal. No respiratory distress.     Breath sounds: Normal breath sounds. No wheezing.  Abdominal:     General: Bowel sounds are normal. There is no distension.     Palpations: Abdomen is soft. There is no mass.     Tenderness: There is no abdominal tenderness. There is no guarding or rebound.  Musculoskeletal:        General: No tenderness. Normal range of motion.     Cervical back: Normal range of motion and neck supple.  Skin:    General: Skin is warm.  Neurological:     Mental Status: She is alert and oriented to person, place, and  time.  Psychiatric:        Mood and Affect: Affect normal.      LABORATORY DATA:  I have reviewed the data as listed Lab Results  Component Value Date   WBC 5.5 04/12/2022   HGB 13.1 04/12/2022   HCT 40.1 04/12/2022   MCV 95.0 04/12/2022   PLT 244 04/12/2022   Recent Labs    10/12/21 1014 12/06/21 2151 04/12/22 0954  NA 136 138 134*  K 4.6 4.7 4.1  CL 100 104 100  CO2 '31 26 27  '$ GLUCOSE 110* 108* 117*  BUN '14 13 9  '$ CREATININE 1.01* 0.98 0.81  CALCIUM 9.2 9.5 8.6*  GFRNONAA >60 >60 >60    RADIOGRAPHIC STUDIES: I have personally reviewed the radiological images as listed and agreed with the findings in the report. No results found.  ASSESSMENT & PLAN:   Carcinoma of upper-outer quadrant of right breast in female, estrogen receptor positive ([place]) # Invasive mammary carcinoma right breast-pT1cpN0; grade 1 ER positive PR negative HER-2 negative.  On adjuvant Letrozole [until summer 2026]. STABLE; Tolerating well no major side effects noted except for mild joint pains/hot flashes. Mammo-April 2024[[facility]-surgery]- pending.     # joint pains- G-1 sec to letrozole.  Stable.  # Hot flashes- G-1 sec to letrozole. Stable.  # OSTEOPOROSIS [[facility]-MARCH 2021]-T score -3; On Prolia [2021]; SEP  2023- T-score of -2.9.  Overall stable.  Continue Vit D 1000/day. Continue calcium plus vitamin D plus exercise program. Continue lifting weights/physical activity.  Will continue prolia for now.  We will repeat bone density again in sep, 2025. Today Ca- 8.5- recommend increasing the ca+vit D BID.   # DISPOSITION: # Prolia today # Follow up in 6 months- MD; labs-cbc; cmp; Vit D 25 OH- prolia SQ; Dr.B  All questions were answered. The patient/family knows to call the clinic with any problems, questions or concerns.    Cammie Sickle, MD 04/12/2022 1:09 PM

## 2022-04-14 ENCOUNTER — Ambulatory Visit (INDEPENDENT_AMBULATORY_CARE_PROVIDER_SITE_OTHER): Payer: PPO

## 2022-04-14 VITALS — Ht 63.0 in | Wt 133.0 lb

## 2022-04-14 DIAGNOSIS — Z0000 Encounter for general adult medical examination without abnormal findings: Secondary | ICD-10-CM | POA: Diagnosis not present

## 2022-04-14 NOTE — Patient Instructions (Addendum)
Jennifer Clayton , Thank you for taking time to come for your Medicare Wellness Visit. I appreciate your ongoing commitment to your health goals. Please review the following plan we discussed and let me know if I can assist you in the future.   These are the goals we discussed:  Goals       Patient Stated     Maintain Healthy Lifestyle (pt-stated)      Stay active and healthy diet         This is a list of the screening recommended for you and due dates:  Health Maintenance  Topic Date Due   COVID-19 Vaccine (3 - Pfizer risk series) 04/30/2022*   Medicare Annual Wellness Visit  04/14/2023   Mammogram  04/29/2023   Colon Cancer Screening  12/17/2026   DTaP/Tdap/Td vaccine (2 - Td or Tdap) 06/24/2027   Pneumonia Vaccine  Completed   Flu Shot  Completed   DEXA scan (bone density measurement)  Completed   Hepatitis C Screening: USPSTF Recommendation to screen - Ages 41-79 yo.  Completed   Zoster (Shingles) Vaccine  Completed   HPV Vaccine  Aged Out  *Topic was postponed. The date shown is not the original due date.    Advanced directives: End of life planning; Advance aging; Advanced directives discussed.  Copy of current HCPOA/Living Will requested.    Conditions/risks identified: none new.  Next appointment: Follow up in one year for your annual wellness visit    Preventive Care 65 Years and Older, Female Preventive care refers to lifestyle choices and visits with your health care provider that can promote health and wellness. What does preventive care include? A yearly physical exam. This is also called an annual well check. Dental exams once or twice a year. Routine eye exams. Ask your health care provider how often you should have your eyes checked. Personal lifestyle choices, including: Daily care of your teeth and gums. Regular physical activity. Eating a healthy diet. Avoiding tobacco and drug use. Limiting alcohol use. Practicing safe sex. Taking low-dose aspirin  every day. Taking vitamin and mineral supplements as recommended by your health care provider. What happens during an annual well check? The services and screenings done by your health care provider during your annual well check will depend on your age, overall health, lifestyle risk factors, and family history of disease. Counseling  Your health care provider may ask you questions about your: Alcohol use. Tobacco use. Drug use. Emotional well-being. Home and relationship well-being. Sexual activity. Eating habits. History of falls. Memory and ability to understand (cognition). Work and work Statistician. Reproductive health. Screening  You may have the following tests or measurements: Height, weight, and BMI. Blood pressure. Lipid and cholesterol levels. These may be checked every 5 years, or more frequently if you are over 16 years old. Skin check. Lung cancer screening. You may have this screening every year starting at age 89 if you have a 30-pack-year history of smoking and currently smoke or have quit within the past 15 years. Fecal occult blood test (FOBT) of the stool. You may have this test every year starting at age 19. Flexible sigmoidoscopy or colonoscopy. You may have a sigmoidoscopy every 5 years or a colonoscopy every 10 years starting at age 24. Hepatitis C blood test. Hepatitis B blood test. Sexually transmitted disease (STD) testing. Diabetes screening. This is done by checking your blood sugar (glucose) after you have not eaten for a while (fasting). You may have this done every 1-3  years. Bone density scan. This is done to screen for osteoporosis. You may have this done starting at age 2. Mammogram. This may be done every 1-2 years. Talk to your health care provider about how often you should have regular mammograms. Talk with your health care provider about your test results, treatment options, and if necessary, the need for more tests. Vaccines  Your health  care provider may recommend certain vaccines, such as: Influenza vaccine. This is recommended every year. Tetanus, diphtheria, and acellular pertussis (Tdap, Td) vaccine. You may need a Td booster every 10 years. Zoster vaccine. You may need this after age 56. Pneumococcal 13-valent conjugate (PCV13) vaccine. One dose is recommended after age 24. Pneumococcal polysaccharide (PPSV23) vaccine. One dose is recommended after age 37. Talk to your health care provider about which screenings and vaccines you need and how often you need them. This information is not intended to replace advice given to you by your health care provider. Make sure you discuss any questions you have with your health care provider. Document Released: 02/13/2015 Document Revised: 10/07/2015 Document Reviewed: 11/18/2014 Elsevier Interactive Patient Education  2017 [place] Prevention in the Home Falls can cause injuries. They can happen to people of all ages. There are many things you can do to make your home safe and to help prevent falls. What can I do on the outside of my home? Regularly fix the edges of walkways and driveways and fix any cracks. Remove anything that might make you trip as you walk through a door, such as a raised step or threshold. Trim any bushes or trees on the path to your home. Use bright outdoor lighting. Clear any walking paths of anything that might make someone trip, such as rocks or tools. Regularly check to see if handrails are loose or broken. Make sure that both sides of any steps have handrails. Any raised decks and porches should have guardrails on the edges. Have any leaves, snow, or ice cleared regularly. Use sand or salt on walking paths during winter. Clean up any spills in your garage right away. This includes oil or grease spills. What can I do in the bathroom? Use night lights. Install grab bars by the toilet and in the tub and shower. Do not use towel bars as grab  bars. Use non-skid mats or decals in the tub or shower. If you need to sit down in the shower, use a plastic, non-slip stool. Keep the floor dry. Clean up any water that spills on the floor as soon as it happens. Remove soap buildup in the tub or shower regularly. Attach bath mats securely with double-sided non-slip rug tape. Do not have throw rugs and other things on the floor that can make you trip. What can I do in the bedroom? Use night lights. Make sure that you have a light by your bed that is easy to reach. Do not use any sheets or blankets that are too big for your bed. They should not hang down onto the floor. Have a firm chair that has side arms. You can use this for support while you get dressed. Do not have throw rugs and other things on the floor that can make you trip. What can I do in the kitchen? Clean up any spills right away. Avoid walking on wet floors. Keep items that you use a lot in easy-to-reach places. If you need to reach something above you, use a strong step stool that has a grab  bar. Keep electrical cords out of the way. Do not use floor polish or wax that makes floors slippery. If you must use wax, use non-skid floor wax. Do not have throw rugs and other things on the floor that can make you trip. What can I do with my stairs? Do not leave any items on the stairs. Make sure that there are handrails on both sides of the stairs and use them. Fix handrails that are broken or loose. Make sure that handrails are as long as the stairways. Check any carpeting to make sure that it is firmly attached to the stairs. Fix any carpet that is loose or worn. Avoid having throw rugs at the top or bottom of the stairs. If you do have throw rugs, attach them to the floor with carpet tape. Make sure that you have a light switch at the top of the stairs and the bottom of the stairs. If you do not have them, ask someone to add them for you. What else can I do to help prevent  falls? Wear shoes that: Do not have high heels. Have rubber bottoms. Are comfortable and fit you well. Are closed at the toe. Do not wear sandals. If you use a stepladder: Make sure that it is fully opened. Do not climb a closed stepladder. Make sure that both sides of the stepladder are locked into place. Ask someone to hold it for you, if possible. Clearly mark and make sure that you can see: Any grab bars or handrails. First and last steps. Where the edge of each step is. Use tools that help you move around (mobility aids) if they are needed. These include: Canes. Walkers. Scooters. Crutches. Turn on the lights when you go into a dark area. Replace any light bulbs as soon as they burn out. Set up your furniture so you have a clear path. Avoid moving your furniture around. If any of your floors are uneven, fix them. If there are any pets around you, be aware of where they are. Review your medicines with your doctor. Some medicines can make you feel dizzy. This can increase your chance of falling. Ask your doctor what other things that you can do to help prevent falls. This information is not intended to replace advice given to you by your health care provider. Make sure you discuss any questions you have with your health care provider. Document Released: 11/13/2008 Document Revised: 06/25/2015 Document Reviewed: 02/21/2014 Elsevier Interactive Patient Education  2017 [employer].

## 2022-04-14 NOTE — Progress Notes (Signed)
Subjective:   Jennifer Clayton is a 67 y.o. female who presents for Medicare Annual (Subsequent) preventive examination.  Review of Systems    No ROS.  Medicare Wellness Virtual Visit.  Visual/audio telehealth visit, UTA vital signs.   See social history for additional risk factors.   Cardiac Risk Factors include: advanced age (>66mn, >>5women)     Objective:    Today's Vitals   04/14/22 1307  Weight: 133 lb (60.3 kg)  Height: '5\' 3"'$  (1.6 m)   Body mass index is 23.56 kg/m.     04/14/2022    1:11 PM 04/12/2022   10:31 AM 12/16/2021    7:04 AM 12/06/2021    9:42 PM 10/12/2021   10:35 AM 10/11/2021   10:55 AM 04/13/2021   10:38 AM  Advanced Directives  Does Patient Have a Medical Advance Directive? Yes Yes Yes No Yes Yes Yes  Type of AParamedicof AWardnerLiving will HLowry CrossingLiving will   HHenriettaLiving will HFlatwoodsLiving will Living will;Healthcare Power of Attorney  Does patient want to make changes to medical advance directive? No - Patient declined     No - Patient declined   Copy of HAtlantain Chart? No - copy requested No - copy requested    No - copy requested     Current Medications (verified) Outpatient Encounter Medications as of 04/14/2022  Medication Sig   albuterol (VENTOLIN HFA) 108 (90 Base) MCG/ACT inhaler Inhale 2 puffs into the lungs every 6 (six) hours as needed for wheezing or shortness of breath.   amLODipine (NORVASC) 5 MG tablet Take 1 tablet (5 mg total) by mouth daily.   Ascorbic Acid (VITAMIN C PO) Take 1 tablet by mouth daily.   buPROPion (WELLBUTRIN XL) 300 MG 24 hr tablet TAKE 1 TABLET BY MOUTH DAILY   calcium carbonate (OS-CAL - DOSED IN MG OF ELEMENTAL CALCIUM) 1250 (500 Ca) MG tablet Take 1 tablet by mouth daily.   denosumab (PROLIA) 60 MG/ML SOSY injection Inject 60 mg into the skin every 6 (six) months.   fluorouracil (EFUDEX) 5 %  cream Apply twice daily to right forehead, right upper lip, left temple for 7 days   Ivermectin (SOOLANTRA) 1 % CREA APPLY THIN LAYER TO FACE DAILY   letrozole (FEMARA) 2.5 MG tablet TAKE ONE TABLET BY MOUTH EVERY DAY   levothyroxine (SYNTHROID) 100 MCG tablet TAKE 1 TABLET EVERY DAY ON EMPTY STOMACHWITH A GLASS OF WATER AT LEAST 30-60 MINBEFORE BREAKFAST (Patient taking differently: Take 100 mcg by mouth daily before breakfast.)   Multiple Vitamins-Minerals (MULTIVITAMIN ADULT PO) Take 1 tablet by mouth daily.   mupirocin ointment (BACTROBAN) 2 % Apply to skin qd-bid   omeprazole (PRILOSEC) 20 MG capsule Take 1 capsule (20 mg total) by mouth daily.   rosuvastatin (CRESTOR) 5 MG tablet Take 1 tablet (5 mg total) by mouth every evening.   SUMAtriptan (IMITREX) 50 MG tablet TAKE 1 TABLET ONCE MAY REPEAT IN 2 HOURSIF NECESSARY   triamcinolone (KENALOG) 0.1 % paste Apply twice daily to ulcers in mouth. Dry area before application   zolpidem (AMBIEN) 5 MG tablet TAKE 1 TABLET BY MOUTH NIGHTLY   No facility-administered encounter medications on file as of 04/14/2022.    Allergies (verified) Zomepirac, Amoxicillin-pot clavulanate, and Other   History: Past Medical History:  Diagnosis Date   Basal cell carcinoma 02/10/2022   Left lower eyelid. Nodular. Mohs pending.  Breast cancer ([place]) 04/2019   right breast   Cancer ([place])    breast cancer   Dyspnea    H/O radioactive iodine thyroid ablation    Headache    MIGRAINES   History of actinic keratosis 05/30/2018   right posterior shoulder   History of actinic keratosis 03/31/2020   right forearm   History of squamous cell carcinoma 10/19/2012   right anterior lower leg / excised 10/31/2012   Hypertension    Hypothyroidism    Personal history of radiation therapy    Past Surgical History:  Procedure Laterality Date   ABDOMINAL HYSTERECTOMY     Age 42, for bleeding and cyst; NO ovaries   BREAST BIOPSY Right 04/25/2019   positive/ done  in Dr. Dwyane Luo office   BREAST LUMPECTOMY Right 05/13/2019   positive   BREAST LUMPECTOMY WITH SENTINEL LYMPH NODE BIOPSY Right 05/13/2019   Procedure: BREAST LUMPECTOMY WITH SENTINEL LYMPH NODE BX;  Surgeon: Robert Bellow, MD;  Location: [facility] ORS;  Service: General;  Laterality: Right;   COLONOSCOPY WITH PROPOFOL N/A 12/16/2021   Procedure: COLONOSCOPY WITH PROPOFOL;  Surgeon: Lucilla Lame, MD;  Location: [facility] ENDOSCOPY;  Service: Endoscopy;  Laterality: N/A;   VAGINAL DELIVERY     Family History  Problem Relation Age of Onset   Cancer Mother        lung, non-smoker   Dementia Father        related to traumatic brain injury   Breast cancer Neg Hx    Social History   Socioeconomic History   Marital status: Married    Spouse name: Not on file   Number of children: Not on file   Years of education: Not on file   Highest education level: Not on file  Occupational History   Not on file  Tobacco Use   Smoking status: Never   Smokeless tobacco: Never  Vaping Use   Vaping Use: Never used  Substance and Sexual Activity   Alcohol use: Yes    Alcohol/week: 1.0 standard drink of alcohol    Types: 1 Glasses of wine per week    Comment: rare   Drug use: Never   Sexual activity: Not on file  Other Topics Concern   Not on file  Social History Narrative   Lives in [place] with husband and son and 4 grandchildren.      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       Never smoked; social alcohol.       Social Determinants of Health   Financial Resource Strain: Low Risk  (04/14/2022)   Overall Financial Resource Strain (CARDIA)    Difficulty of Paying Living Expenses: Not hard at all  Food Insecurity: No Food Insecurity (04/14/2022)   Hunger Vital Sign    Worried About Running Out of Food in the Last Year: Never true    Ran Out of Food in the Last Year: Never  true  Transportation Needs: No Transportation Needs (04/14/2022)   PRAPARE - THydrologist(Medical): No    Lack of Transportation (Non-Medical): No  Physical Activity: Sufficiently Active (04/14/2022)   Exercise Vital Sign    Days of Exercise per Week: 5 days    Minutes of Exercise per Session: 60 min  Stress: No Stress Concern Present (04/14/2022)  [employer] [place] Questionnaire    Feeling of Stress : Not at all  Social Connections: Unknown (04/14/2022)   Social Connection and Isolation Panel [NHANES]    Frequency of Communication with Friends and Family: More than three times a week    Frequency of Social Gatherings with Friends and Family: More than three times a week    Attends Religious Services: Not on Advertising copywriter or Organizations: Not on file    Attends Archivist Meetings: Not on file    Marital Status: Married    Tobacco Counseling Counseling given: Not Answered   Clinical Intake:  Pre-visit preparation completed: Yes        Diabetes: No  How often do you need to have someone help you when you read instructions, pamphlets, or other written materials from your doctor or pharmacy?: 1 - Never    Interpreter Needed?: No      Activities of Daily Living    04/14/2022    1:10 PM  In your present state of health, do you have any difficulty performing the following activities:  Hearing? 0  Vision? 0  Difficulty concentrating or making decisions? 0  Walking or climbing stairs? 0  Dressing or bathing? 0  Doing errands, shopping? 0  Preparing Food and eating ? N  Using the Toilet? N  In the past six months, have you accidently leaked urine? N  Do you have problems with loss of bowel control? N  Managing your Medications? N  Managing your Finances? N  Housekeeping or managing your Housekeeping? N    Patient Care Team: Burnard Hawthorne, FNP as PCP - General  (Family Medicine) Theodore Demark, RN (Inactive) as Oncology Nurse Navigator Noreene Filbert, MD as Radiation Oncologist (Radiation Oncology) Bary Castilla Forest Gleason, MD as Consulting Physician (General Surgery) Cammie Sickle, MD as Consulting Physician (Internal Medicine)  Indicate any recent Medical Services you may have received from other than [facility] providers in the past year (date may be approximate).     Assessment:   This is a routine wellness examination for Jennifer Clayton.  I connected with  Asli Bustamante Cott on 04/14/22 by a audio enabled telemedicine application and verified that I am speaking with the correct person using two identifiers.  Patient Location: Home  Provider Location: Office/Clinic  I discussed the limitations of evaluation and management by telemedicine. The patient expressed understanding and agreed to proceed.   Hearing/Vision screen Hearing Screening - Comments:: Patient is able to hear conversational tones without difficulty. No issues reported. Vision Screening - Comments:: Wears corrective lenses when reading  Dietary issues and exercise activities discussed: Current Exercise Habits: Home exercise routine, Type of exercise: treadmill;strength training/weights;walking (elliptical), Time (Minutes): 60, Frequency (Times/Week): 6, Weekly Exercise (Minutes/Week): 360, Intensity: Mild   Goals Addressed               This Visit's Progress     Patient Stated     Maintain Healthy Lifestyle (pt-stated)        Stay active and healthy diet        Depression Screen    04/14/2022    1:13 PM 12/01/2021   10:35 AM 03/16/2021   12:43 PM 08/26/2020   10:47 AM 03/13/2020   12:41 PM 11/27/2019   12:03 PM 03/05/2019   10:56 AM  PHQ 2/9 Scores  PHQ - 2 Score 0 0 0 0 0 0 0  PHQ- 9 Score  2     Fall Risk    04/14/2022    1:13 PM 12/01/2021   10:35 AM 08/06/2021   10:10 AM 03/16/2021    1:06 PM 03/13/2020   12:47 PM  Fall Risk   Falls in the past year? 0 0 0  0 0  Number falls in past yr: 0 0 0 0 0  Injury with Fall? 0 0 0  0  Risk for fall due to :  No Fall Risks No Fall Risks    Follow up Falls evaluation completed;Falls prevention discussed Falls evaluation completed Falls evaluation completed Falls evaluation completed Falls evaluation completed    FALL RISK PREVENTION PERTAINING TO THE HOME: Home free of loose throw rugs in walkways, pet beds, electrical cords, etc? Yes  Adequate lighting in your home to reduce risk of falls? Yes   ASSISTIVE DEVICES UTILIZED TO PREVENT FALLS: Life alert? No  Use of a cane, walker or w/c? No  Grab bars in the bathroom? No  Shower chair or bench in shower? No  Elevated toilet seat or a handicapped toilet? No   TIMED UP AND GO: Was the test performed? No .    Cognitive Function:        04/14/2022    1:13 PM  6CIT Screen  What Year? 0 points  What month? 0 points  What time? 0 points  Count back from 20 0 points  Months in reverse 0 points  Repeat phrase 0 points  Total Score 0 points    Immunizations Immunization History  Administered Date(s) Administered   Fluad Quad(high Dose 65+) 11/27/2019, 12/01/2021   Influenza,inj,Quad PF,6+ Mos 10/21/2015   Influenza-Unspecified 12/02/2018, 11/12/2020   PFIZER(Purple Top)SARS-COV-2 Vaccination 04/01/2019, 04/22/2019   PNEUMOCOCCAL CONJUGATE-20 05/27/2020   Tdap 06/23/2017   Zoster Recombinat (Shingrix) 12/20/2017, 03/22/2018   Screening Tests Health Maintenance  Topic Date Due   COVID-19 Vaccine (3 - Pfizer risk series) 04/30/2022 (Originally 05/20/2019)   Medicare Annual Wellness (AWV)  04/14/2023   MAMMOGRAM  04/29/2023   COLONOSCOPY (Pts 45-39yr Insurance coverage will need to be confirmed)  12/17/2026   DTaP/Tdap/Td (2 - Td or Tdap) 06/24/2027   Pneumonia Vaccine 69 Years old  Completed   INFLUENZA VACCINE  Completed   DEXA SCAN  Completed   Hepatitis C Screening  Completed   Zoster Vaccines- Shingrix  Completed   HPV VACCINES   Aged Out    Health Maintenance There are no preventive care reminders to display for this patient.  Lung Cancer Screening: (Low Dose CT Chest recommended if Age 69-80years, 30 pack-year currently smoking OR have quit w/in 15years.) does not qualify.   Hepatitis C Screening: Completed 11/2015.  Vision Screening: Recommended annual ophthalmology exams for early detection of glaucoma and other disorders of the eye.  Dental Screening: Recommended annual dental exams for proper oral hygiene  Community Resource Referral / Chronic Care Management: CRR required this visit?  No   CCM required this visit?  No      Plan:     I have personally reviewed and noted the following in the patient's chart:   Medical and social history Use of alcohol, tobacco or illicit drugs  Current medications and supplements including opioid prescriptions. Patient is not currently taking opioid prescriptions. Functional ability and status Nutritional status Physical activity Advanced directives List of other physicians Hospitalizations, surgeries, and ER visits in previous 12 months Vitals Screenings to include cognitive, depression, and falls Referrals and appointments  In addition, I  have reviewed and discussed with patient certain preventive protocols, quality metrics, and best practice recommendations. A written personalized care plan for preventive services as well as general preventive health recommendations were provided to patient.     Leta Jungling, LPN   [id number]

## 2022-04-27 DIAGNOSIS — L814 Other melanin hyperpigmentation: Secondary | ICD-10-CM | POA: Diagnosis not present

## 2022-04-27 DIAGNOSIS — L988 Other specified disorders of the skin and subcutaneous tissue: Secondary | ICD-10-CM | POA: Diagnosis not present

## 2022-04-27 DIAGNOSIS — C441192 Basal cell carcinoma of skin of left lower eyelid, including canthus: Secondary | ICD-10-CM | POA: Diagnosis not present

## 2022-04-27 DIAGNOSIS — L578 Other skin changes due to chronic exposure to nonionizing radiation: Secondary | ICD-10-CM | POA: Diagnosis not present

## 2022-04-28 ENCOUNTER — Telehealth: Payer: Self-pay

## 2022-04-28 NOTE — Telephone Encounter (Signed)
Mohs surgery notes from Dr. Anne Fu at [facility] scanned in under media tab for your review.

## 2022-04-28 NOTE — Telephone Encounter (Signed)
Reviewed. MAs please confirm that patient is scheduled for FBSE and mark off in the biopsy book. Thank you! 

## 2022-05-02 ENCOUNTER — Ambulatory Visit: Payer: PPO | Admitting: Family

## 2022-05-03 ENCOUNTER — Ambulatory Visit
Admission: RE | Admit: 2022-05-03 | Discharge: 2022-05-03 | Disposition: A | Discharge status: Home / Self Care | Payer: PPO | Source: Ambulatory Visit | Attending: Family | Admitting: Family

## 2022-05-03 DIAGNOSIS — Z1231 Encounter for screening mammogram for malignant neoplasm of breast: Secondary | ICD-10-CM

## 2022-05-03 DIAGNOSIS — Z853 Personal history of malignant neoplasm of breast: Secondary | ICD-10-CM | POA: Diagnosis not present

## 2022-05-03 DIAGNOSIS — R928 Other abnormal and inconclusive findings on diagnostic imaging of breast: Secondary | ICD-10-CM | POA: Diagnosis not present

## 2022-05-06 ENCOUNTER — Ambulatory Visit: Payer: PPO | Admitting: Family

## 2022-05-18 ENCOUNTER — Other Ambulatory Visit: Payer: Self-pay | Admitting: Family

## 2022-05-18 DIAGNOSIS — G4700 Insomnia, unspecified: Secondary | ICD-10-CM

## 2022-05-25 ENCOUNTER — Ambulatory Visit (INDEPENDENT_AMBULATORY_CARE_PROVIDER_SITE_OTHER): Payer: PPO | Admitting: Family

## 2022-05-25 ENCOUNTER — Encounter: Payer: PPO | Admitting: Dermatology

## 2022-05-25 ENCOUNTER — Encounter: Payer: Self-pay | Admitting: Family

## 2022-05-25 VITALS — BP 122/76 | HR 74 | Temp 97.8°F | Ht 63.6 in | Wt 132.8 lb

## 2022-05-25 DIAGNOSIS — G4700 Insomnia, unspecified: Secondary | ICD-10-CM | POA: Diagnosis not present

## 2022-05-25 DIAGNOSIS — E039 Hypothyroidism, unspecified: Secondary | ICD-10-CM | POA: Diagnosis not present

## 2022-05-25 DIAGNOSIS — E785 Hyperlipidemia, unspecified: Secondary | ICD-10-CM | POA: Diagnosis not present

## 2022-05-25 DIAGNOSIS — H9312 Tinnitus, left ear: Secondary | ICD-10-CM

## 2022-05-25 DIAGNOSIS — E890 Postprocedural hypothyroidism: Secondary | ICD-10-CM

## 2022-05-25 MED ORDER — ZOLPIDEM TARTRATE 5 MG PO TABS: 5.0000 mg | ORAL_TABLET | Freq: Every day | ORAL | 2 refills | Status: DC

## 2022-05-25 MED ORDER — ROSUVASTATIN CALCIUM 5 MG PO TABS: 5.0000 mg | ORAL_TABLET | Freq: Every evening | ORAL | 3 refills | Status: DC

## 2022-05-25 MED ORDER — AZELASTINE HCL 0.1 % NA SOLN: 1.0000 | Freq: Two times a day (BID) | NASAL | 4 refills | Status: DC

## 2022-05-25 NOTE — Assessment & Plan Note (Signed)
Chronic.  No hearing loss.  No symptoms to suggest vascular etiology.  No medications which I would suspect to be culprit for tinnitus.  Discussed possible eustachian tube dysfunction.  Trial of azelastine.  If persist, worsens, advised patient to consider ENT referral.

## 2022-05-25 NOTE — Assessment & Plan Note (Signed)
Chronic, stable.  Continue Ambien 5 mg 

## 2022-05-25 NOTE — Progress Notes (Signed)
Assessment & Plan:  Left-sided tinnitus Assessment & Plan: Chronic.  No hearing loss.  No symptoms to suggest vascular etiology.  No medications which I would suspect to be culprit for tinnitus.  Discussed possible eustachian tube dysfunction.  Trial of azelastine.  If persist, worsens, advised patient to consider ENT referral.   Insomnia, unspecified type Assessment & Plan: Chronic, stable.  Continue Ambien   Orders: -     Zolpidem Tartrate; Take 1 tablet (5 mg total) by mouth at bedtime.  Dispense: 30 tablet; Refill: 2  Hyperlipidemia, unspecified hyperlipidemia type -     Rosuvastatin Calcium; Take 1 tablet (5 mg total) by mouth every evening.  Dispense: 90 tablet; Refill: 3  Postprocedural hypothyroidism  Hypothyroidism, unspecified type  Other orders -     Azelastine HCl; Place 1 spray into both nostrils 2 (two) times daily. Use in each nostril as directed  Dispense: 30 mL; Refill: 4     Return precautions given.   Risks, benefits, and alternatives of the medications and treatment plan prescribed today were discussed, and patient expressed understanding.   Education regarding symptom management and diagnosis given to patient on AVS either electronically or printed.  Return in about 3 months (around 08/24/2022).  Rennie Plowman, FNP  Subjective:    Patient ID: Jennifer Clayton, female    DOB: 09-13-1955, 67 y.o.   MRN: [id number]  CC: Jennifer Clayton is a 67 y.o. female who presents today for follow up.   HPI: Complains of hugh pitched hum in left ear, for one year. All the time.     No history of hearing damage aside from listening with her Apple earbuds.    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 NO h/o thyroidectomy.  .previously followed with endocrinology, Dr. Gershon Crane, [facility]. She follows with [facility], [facility].     Allergies:  Zomepirac, Amoxicillin-pot clavulanate, and Other Current Outpatient Medications on File Prior to Visit  Medication Sig Dispense Refill   albuterol (VENTOLIN HFA) 108 (90 Base) MCG/ACT inhaler Inhale 2 puffs into the lungs every 6 (six) hours as needed for wheezing or shortness of breath. 8 g 0   amLODipine (NORVASC) 5 MG tablet Take 1 tablet (5 mg total) by mouth daily. 90 tablet 3   Ascorbic Acid (VITAMIN C PO) Take 1 tablet by mouth daily.     buPROPion (WELLBUTRIN XL) 300 MG 24 hr tablet TAKE 1 TABLET BY MOUTH DAILY 90 tablet 1   calcium carbonate (OS-CAL - DOSED IN MG OF ELEMENTAL CALCIUM) 1250 (500 Ca) MG tablet Take 1 tablet by mouth daily.     denosumab (PROLIA) 60 MG/ML SOSY injection Inject 60 mg into the skin every 6 (six) months.     fluorouracil (EFUDEX) 5 % cream Apply twice daily to right forehead, right upper lip, left temple for 7 days 40 g 1   Ivermectin (SOOLANTRA) 1 % CREA APPLY THIN LAYER TO FACE DAILY 45 g 3   letrozole (FEMARA) 2.5 MG tablet TAKE ONE TABLET BY MOUTH EVERY DAY 90 tablet 3   levothyroxine (SYNTHROID) 100 MCG tablet TAKE 1 TABLET EVERY DAY ON EMPTY STOMACHWITH A GLASS OF WATER AT LEAST 30-60 MINBEFORE BREAKFAST (Patient taking differently: Take 100 mcg by mouth daily before breakfast.) 90 tablet 0   Multiple Vitamins-Minerals (MULTIVITAMIN ADULT PO) Take 1 tablet by mouth daily.     mupirocin ointment (BACTROBAN) 2 % Apply  to skin qd-bid 22 g 1   omeprazole (PRILOSEC) 20 MG capsule Take 1 capsule (20 mg total) by mouth daily. 30 capsule 0   SUMAtriptan (IMITREX) 50 MG tablet TAKE 1 TABLET ONCE MAY REPEAT IN 2 HOURSIF NECESSARY 15 tablet 2   triamcinolone (KENALOG) 0.1 % paste Apply twice daily to ulcers in mouth. Dry area before application 5 g 2   No current facility-administered medications on file prior to visit.    Review of Systems  Constitutional:  Negative for chills and fever.  HENT:  Positive for tinnitus. Negative for congestion, ear discharge,  ear pain, facial swelling, hearing loss and trouble swallowing.   Eyes:  Negative for visual disturbance.  Respiratory:  Negative for cough.   Cardiovascular:  Negative for chest pain and palpitations.  Gastrointestinal:  Negative for nausea and vomiting.  Neurological:  Negative for dizziness and headaches.      Objective:    BP 122/76   Pulse 74   Temp 97.8 F (36.6 C) (Oral)   Ht 5' 3.6" (1.615 m)   Wt 132 lb 12.8 oz (60.2 kg)   SpO2 99%   BMI 23.08 kg/m  BP Readings from Last 3 Encounters:  05/25/22 122/76  04/12/22 118/70  02/10/22 (!) 151/82   Wt Readings from Last 3 Encounters:  05/25/22 132 lb 12.8 oz (60.2 kg)  04/14/22 133 lb (60.3 kg)  04/12/22 133 lb 3.2 oz (60.4 kg)    Physical Exam Vitals reviewed.  Constitutional:      Appearance: She is well-developed.  HENT:     Head: Normocephalic and atraumatic.     Right Ear: Hearing, tympanic membrane, ear canal and external ear normal. No decreased hearing noted. No drainage, swelling or tenderness. No middle ear effusion. No foreign body. Tympanic membrane is not erythematous or bulging.     Left Ear: Hearing, tympanic membrane, ear canal and external ear normal. No decreased hearing noted. No drainage, swelling or tenderness.  No middle ear effusion. No foreign body. Tympanic membrane is not erythematous or bulging.     Nose: Nose normal. No rhinorrhea.     Right Sinus: No maxillary sinus tenderness or frontal sinus tenderness.     Left Sinus: No maxillary sinus tenderness or frontal sinus tenderness.     Mouth/Throat:     Pharynx: Uvula midline. No oropharyngeal exudate or posterior oropharyngeal erythema.     Tonsils: No tonsillar abscesses.  Eyes:     Conjunctiva/sclera: Conjunctivae normal.  Cardiovascular:     Rate and Rhythm: Regular rhythm.     Pulses: Normal pulses.     Heart sounds: Normal heart sounds.  Pulmonary:     Effort: Pulmonary effort is normal.     Breath sounds: Normal breath sounds. No  wheezing, rhonchi or rales.  Lymphadenopathy:     Head:     Right side of head: No submental, submandibular, tonsillar, preauricular, posterior auricular or occipital adenopathy.     Left side of head: No submental, submandibular, tonsillar, preauricular, posterior auricular or occipital adenopathy.     Cervical: No cervical adenopathy.  Skin:    General: Skin is warm and dry.  Neurological:     Mental Status: She is alert.  Psychiatric:        Speech: Speech normal.        Behavior: Behavior normal.        Thought Content: Thought content normal.

## 2022-05-25 NOTE — Patient Instructions (Signed)
Trial of azelastine for eustachian tube dysfunction.    If ringing in her ear persists or becomes more bothersome, please let me know as I would recommend consult with [facility]

## 2022-06-08 ENCOUNTER — Other Ambulatory Visit: Payer: Self-pay | Admitting: Internal Medicine

## 2022-06-30 ENCOUNTER — Encounter: Payer: Self-pay | Admitting: Dermatology

## 2022-06-30 ENCOUNTER — Ambulatory Visit: Payer: PPO | Admitting: Dermatology

## 2022-06-30 ENCOUNTER — Other Ambulatory Visit: Payer: Self-pay | Admitting: Family

## 2022-06-30 VITALS — BP 138/86 | HR 70

## 2022-06-30 DIAGNOSIS — L719 Rosacea, unspecified: Secondary | ICD-10-CM

## 2022-06-30 DIAGNOSIS — Z872 Personal history of diseases of the skin and subcutaneous tissue: Secondary | ICD-10-CM | POA: Diagnosis not present

## 2022-06-30 DIAGNOSIS — Z5111 Encounter for antineoplastic chemotherapy: Secondary | ICD-10-CM

## 2022-06-30 DIAGNOSIS — L578 Other skin changes due to chronic exposure to nonionizing radiation: Secondary | ICD-10-CM

## 2022-06-30 DIAGNOSIS — Z85828 Personal history of other malignant neoplasm of skin: Secondary | ICD-10-CM | POA: Diagnosis not present

## 2022-06-30 DIAGNOSIS — L570 Actinic keratosis: Secondary | ICD-10-CM

## 2022-06-30 DIAGNOSIS — W908XXA Exposure to other nonionizing radiation, initial encounter: Secondary | ICD-10-CM | POA: Diagnosis not present

## 2022-06-30 DIAGNOSIS — X32XXXA Exposure to sunlight, initial encounter: Secondary | ICD-10-CM

## 2022-06-30 DIAGNOSIS — R635 Abnormal weight gain: Secondary | ICD-10-CM

## 2022-06-30 DIAGNOSIS — K130 Diseases of lips: Secondary | ICD-10-CM

## 2022-06-30 MED ORDER — IVERMECTIN 1 % EX CREA
TOPICAL_CREAM | CUTANEOUS | 5 refills | Status: AC
Start: 2022-06-30 — End: ?

## 2022-06-30 NOTE — Progress Notes (Signed)
Follow-Up Visit   Subjective  Jennifer Clayton is a 67 y.o. female who presents for the following: Patient follow up on aks and hx of bcc follow up. Jennifer Clayton Hx of bcc at left lower eyelid, treated with Mohs 04/27/22. Patient used field treatment cream to aa of face in January reports getting very red and inflamed.    The following portions of the chart were reviewed this encounter and updated as appropriate: medications, allergies, medical history  Review of Systems:  No other skin or systemic complaints except as noted in HPI or Assessment and Plan.  Objective  Well appearing patient in no apparent distress; mood and affect are within normal limits.   A focused examination was performed of the following areas: Face, left lower eyelid,   Relevant exam findings are noted in the Assessment and Plan.    Assessment & Plan    ROSACEA Exam Mid face erythema with telangiectasias  Chronic condition with duration or expected duration over one year. Currently well-controlled.  Rosacea is a chronic progressive skin condition usually affecting the face of adults, causing redness and/or acne bumps. It is treatable but not curable. It sometimes affects the eyes (ocular rosacea) as well. It may respond to topical and/or systemic medication and can flare with stress, sun exposure, alcohol, exercise, topical steroids (including hydrocortisone/cortisone 10) and some foods.  Daily application of broad spectrum spf 30+ sunscreen to face is recommended to reduce flares.  Patient denies grittiness of the eyes  Treatment Plan Continue Soolantra daily to face as needed   CHEILITIS Exam Scaly erythematous patches at angles of mouth  Treatment Plan Start OTC fungal cream (clotrimazole or miconazole, or she can call for ketoconazole cream if not clearing) twice a day Follow with hydrocortisone cream twice a day as needed up to 2 weeks Follow with mupirocin ointment twice a day   ACTINIC DAMAGE  WITH PRECANCEROUS ACTINIC KERATOSES Counseling for Topical Chemotherapy Management: Patient exhibits: - Severe, confluent actinic changes with pre-cancerous actinic keratoses that is secondary to cumulative UV radiation exposure over time - Condition that is severe; chronic, not at goal. - diffuse scaly erythematous macules and papules with underlying dyspigmentation - Discussed Prescription "Field Treatment" topical Chemotherapy for Severe, Chronic Confluent Actinic Changes with Pre-Cancerous Actinic Keratoses Field treatment involves treatment of an entire area of skin that has confluent Actinic Changes (Sun/ Ultraviolet light damage) and PreCancerous Actinic Keratoses by method of PhotoDynamic Therapy (PDT) and/or prescription Topical Chemotherapy agents such as 5-fluorouracil, 5-fluorouracil/calcipotriene, and/or imiquimod.  The purpose is to decrease the number of clinically evident and subclinical PreCancerous lesions to prevent progression to development of skin cancer by chemically destroying early precancer changes that may or may not be visible.  It has been shown to reduce the risk of developing skin cancer in the treated area. As a result of treatment, redness, scaling, crusting, and open sores may occur during treatment course. One or more than one of these methods may be used and may have to be used several times to control, suppress and eliminate the PreCancerous changes. Discussed treatment course, expected reaction, and possible side effects. - Recommend daily broad spectrum sunscreen SPF 30+ to sun-exposed areas, reapply every 2 hours as needed.  - Staying in the shade or wearing long sleeves, sun glasses (UVA+UVB protection) and wide brim hats (4-inch brim around the entire circumference of the hat) are also recommended. - Call for new or changing lesions. - Restart in fall  5-fluorouracil cream twice a day  for 7 days to affected areas at the face. Reviewed course of treatment and  expected reaction.  Patient advised to expect inflammation and crusting and advised that erosions are possible.  Patient advised to be diligent with sun protection during and after treatment. Handout with details of how to apply medication and what to expect provided. Counseled to keep medication out of reach of children and pets.   Rx sent to [facility] in [place]. Patient advised to call the pharmacy to fill the prescription, and the pharmacy will mail it to them for free.     HISTORY OF BASAL CELL CARCINOMA OF THE SKIN Left lower eyelid Moh's 04/27/22  - No evidence of recurrence today - Recommend regular full body skin exams - Recommend daily broad spectrum sunscreen SPF 30+ to sun-exposed areas, reapply every 2 hours as needed.  - Call if any new or changing lesions are noted between office visits   HISTORY OF PRECANCEROUS ACTINIC KERATOSIS Left clavicle bx proven ak treated with ED&C - site(s) of PreCancerous Actinic Keratosis clear today. - these may recur and new lesions may form requiring treatment to prevent transformation into skin cancer - observe for new or changing spots and contact [facility] for appointment if occur - photoprotection with sun protective clothing; sunglasses and broad spectrum sunscreen with SPF of at least 30 + and frequent self skin exams recommended - yearly exams by a dermatologist recommended for persons with history of PreCancerous Actinic Keratoses  Keep scheduled follow-up  Jennifer Clayton, Jennifer Clayton, CMA, am acting as scribe for Jennifer Dates, MD.   Documentation: Jennifer Clayton have reviewed the above documentation for accuracy and completeness, and Jennifer Clayton agree with the above.  Jennifer Dates, MD

## 2022-06-30 NOTE — Patient Instructions (Addendum)
For rash at mouth  Can use over the counter clotrimazole cream found in athletes foot section twice a day along with over the counter hydrocortisone cream daily until healed or clear  Along with mupirocin ointment,.   Recommend Serica moisturizing scar formula cream every night or [facility] brand or Mederma silicone scar sheet every night for the first year after a scar appears to help with scar remodeling if desired. Scars remodel on their own for a full year and will gradually improve in appearance over time.     Recommend taking Heliocare sun protection supplement daily in sunny weather for additional sun protection. For maximum protection on the sunniest days, you can take up to 2 capsules of regular Heliocare OR take 1 capsule of Heliocare Ultra. For prolonged exposure (such as a full day in the sun), you can repeat your dose of the supplement 4 hours after your first dose. Heliocare can be purchased at [employer], at some [facility] or at [email or web address].     Melanoma ABCDEs  Melanoma is the most dangerous type of skin cancer, and is the leading cause of death from skin disease.  You are more likely to develop melanoma if you: Have light-colored skin, light-colored eyes, or red or blond hair Spend a lot of time in the sun Tan regularly, either outdoors or in a tanning bed Have had blistering sunburns, especially during childhood Have a close family member who has had a melanoma Have atypical moles or large birthmarks  Early detection of melanoma is key since treatment is typically straightforward and cure rates are extremely high if we catch it early.   The first sign of melanoma is often a change in a mole or a new dark spot.  The ABCDE system is a way of remembering the signs of melanoma.  A for asymmetry:  The two halves do not match. B for border:  The edges of the growth are irregular. C for color:  A mixture of colors are present instead of an even brown color. D  for diameter:  Melanomas are usually (but not always) greater than 6mm - the size of a pencil eraser. E for evolution:  The spot keeps changing in size, shape, and color.  Please check your skin once per month between visits. You can use a small mirror in front and a large mirror behind you to keep an eye on the back side or your body.   If you see any new or changing lesions before your next follow-up, please call to schedule a visit.  Please continue daily skin protection including broad spectrum sunscreen SPF 30+ to sun-exposed areas, reapplying every 2 hours as needed when you're outdoors.   Staying in the shade or wearing long sleeves, sun glasses (UVA+UVB protection) and wide brim hats (4-inch brim around the entire circumference of the hat) are also recommended for sun protection.     Due to recent changes in healthcare laws, you may see results of your pathology and/or laboratory studies on MyChart before the doctors have had a chance to review them. We understand that in some cases there may be results that are confusing or concerning to you. Please understand that not all results are received at the same time and often the doctors may need to interpret multiple results in order to provide you with the best plan of care or course of treatment. Therefore, we ask that you please give [place] 2 business days to thoroughly review all your results before  contacting the office for clarification. Should we see a critical lab result, you will be contacted sooner.   If You Need Anything After Your Visit  If you have any questions or concerns for your doctor, please call our main line at [phone] and press option 4 to reach your doctor's medical assistant. If no one answers, please leave a voicemail as directed and we will return your call as soon as possible. Messages left after 4 pm will be answered the following business day.   You may also send [place] a message via MyChart. We typically respond to  MyChart messages within 1-2 business days.  For prescription refills, please ask your pharmacy to contact our office. Our fax number is [phone].  If you have an urgent issue when the clinic is closed that cannot wait until the next business day, you can page your doctor at the number below.    Please note that while we do our best to be available for urgent issues outside of office hours, we are not available 24/7.   If you have an urgent issue and are unable to reach [place], you may choose to seek medical care at your doctor's office, retail clinic, urgent care center, or emergency room.  If you have a medical emergency, please immediately call 911 or go to the emergency department.  Pager Numbers  - Dr. Gwen Pounds: [phone]  - Dr. Neale Burly: [phone]  - Dr. Roseanne Reno: [phone]  In the event of inclement weather, please call our main line at [phone] for an update on the status of any delays or closures.  Dermatology Medication Tips: Please keep the boxes that topical medications come in in order to help keep track of the instructions about where and how to use these. Pharmacies typically print the medication instructions only on the boxes and not directly on the medication tubes.   If your medication is too expensive, please contact our office at [phone] option 4 or send [place] a message through MyChart.   We are unable to tell what your co-pay for medications will be in advance as this is different depending on your insurance coverage. However, we may be able to find a substitute medication at lower cost or fill out paperwork to get insurance to cover a needed medication.   If a prior authorization is required to get your medication covered by your insurance company, please allow [place] 1-2 business days to complete this process.  Drug prices often vary depending on where the prescription is filled and some pharmacies may offer cheaper prices.  The website www.goodrx.com  contains coupons for medications through different pharmacies. The prices here do not account for what the cost may be with help from insurance (it may be cheaper with your insurance), but the website can give you the price if you did not use any insurance.  - You can print the associated coupon and take it with your prescription to the pharmacy.  - You may also stop by our office during regular business hours and pick up a GoodRx coupon card.  - If you need your prescription sent electronically to a different pharmacy, notify our office through [facility] or by phone at [phone] option 4.     Si Usted Necesita Algo Despus de Su Visita  Tambin puede enviarnos un mensaje a travs de Clinical cytogeneticist. Por lo general respondemos a los mensajes de MyChart en el transcurso de 1 a 2 das hbiles.  Para renovar recetas, por favor pida a  su farmacia que se ponga en contacto con nuestra oficina. Annie Sable de fax es [place] [phone].  Si tiene un asunto urgente cuando la clnica est cerrada y que no puede esperar hasta el siguiente da hbil, puede llamar/localizar a su doctor(a) al nmero que aparece a continuacin.   Por favor, tenga en cuenta que aunque hacemos todo lo posible para estar disponibles para asuntos urgentes fuera del horario de [place], no estamos disponibles las 24 horas del da, los 7 [address]  [address] de la [place].   Si tiene un problema urgente y no puede comunicarse con nosotros, puede optar por buscar atencin mdica  en el consultorio de su doctor(a), en una clnica privada, en un centro de atencin urgente o en una sala de emergencias.  Si tiene Engineer, drilling, por favor llame inmediatamente al 911 o vaya a la sala de emergencias.  Nmeros de bper  - Dr. Gwen Pounds: [phone]  - Dra. Moye: [phone]  - Dra. Roseanne Reno: [phone]  En caso de inclemencias del [place], por favor llame a Lacy Duverney principal al [phone] para una actualizacin sobre el [place]  de cualquier retraso o cierre.  Consejos para la medicacin en dermatologa: Por favor, guarde las cajas en las que vienen los medicamentos de uso tpico para ayudarle a seguir las instrucciones sobre dnde y cmo usarlos. Las farmacias generalmente imprimen las instrucciones del medicamento slo en las cajas y no directamente en los tubos del [place].   Si su medicamento es muy caro, por favor, pngase en contacto con Rolm Gala llamando al [phone] y presione la opcin 4 o envenos un mensaje a travs de Clinical cytogeneticist.   No podemos decirle cul ser su copago por los medicamentos por adelantado ya que esto es diferente dependiendo de la cobertura de su seguro. Sin embargo, es posible que podamos encontrar un medicamento sustituto a Audiological scientist un formulario para que el seguro cubra el medicamento que se considera necesario.   Si se requiere una autorizacin previa para que su compaa de seguros [place] su medicamento, por favor permtanos de 1 a 2 das hbiles para completar [address]  Los precios de los medicamentos varan con frecuencia dependiendo del Environmental consultant de dnde se surte la receta y alguna farmacias pueden ofrecer precios ms baratos.  El sitio web www.goodrx.com tiene cupones para medicamentos de Health and safety inspector. Los precios aqu no tienen en cuenta lo que podra costar con la ayuda del seguro (puede ser ms barato con su seguro), pero el sitio web puede darle el precio si no utiliz Tourist information centre manager.  - Puede imprimir el cupn correspondiente y llevarlo con su receta a la farmacia.  - Tambin puede pasar por nuestra oficina durante el horario de atencin regular y Education officer, museum una tarjeta de cupones de GoodRx.  - Si necesita que su receta se enve electrnicamente a una farmacia diferente, informe a nuestra oficina a travs de MyChart de  o por telfono llamando al [phone] y presione la opcin 4. Marland Kitchen

## 2022-07-13 ENCOUNTER — Other Ambulatory Visit: Payer: Self-pay | Admitting: Family

## 2022-07-13 DIAGNOSIS — G43919 Migraine, unspecified, intractable, without status migrainosus: Secondary | ICD-10-CM

## 2022-07-13 MED ORDER — SUMATRIPTAN SUCCINATE 50 MG PO TABS
ORAL_TABLET | ORAL | 2 refills | Status: AC
Start: 2022-07-13 — End: ?

## 2022-07-29 ENCOUNTER — Encounter: Payer: Self-pay | Admitting: Family

## 2022-07-29 ENCOUNTER — Ambulatory Visit (INDEPENDENT_AMBULATORY_CARE_PROVIDER_SITE_OTHER): Payer: PPO | Admitting: Family

## 2022-07-29 VITALS — BP 130/80 | HR 78 | Temp 98.0°F | Ht 63.6 in | Wt 131.0 lb

## 2022-07-29 DIAGNOSIS — R110 Nausea: Secondary | ICD-10-CM | POA: Diagnosis not present

## 2022-07-29 DIAGNOSIS — G4700 Insomnia, unspecified: Secondary | ICD-10-CM | POA: Diagnosis not present

## 2022-07-29 MED ORDER — ZOLPIDEM TARTRATE 5 MG PO TABS
5.0000 mg | ORAL_TABLET | Freq: Every day | ORAL | 2 refills | Status: DC
Start: 2022-07-29 — End: 2022-12-08

## 2022-07-29 MED ORDER — ONDANSETRON HCL 4 MG PO TABS: 4.0000 mg | ORAL_TABLET | Freq: Three times a day (TID) | ORAL | 0 refills | Status: AC | PRN

## 2022-07-29 NOTE — Assessment & Plan Note (Signed)
Chronic, stable.  Continue Ambien 5mg  qhs

## 2022-07-29 NOTE — Progress Notes (Signed)
Assessment & Plan:  Nausea Assessment & Plan: Afebrile.  No abdominal pain on exam today. Etiology of nausea unclear at this time.  Strict return precautions given to patient today that if nausea were to persist or certainly she develops further symptoms including fever,  abdominal pain to report to emergency room or urgent care over the weekend for in person evaluation  Orders: -     Ondansetron HCl; Take 1 tablet (4 mg total) by mouth every 8 (eight) hours as needed for nausea or vomiting.  Dispense: 30 tablet; Refill: 0  Insomnia, unspecified type Assessment & Plan: Chronic, stable.  Continue Ambien 5mg  qhs  Orders: -     Zolpidem Tartrate; Take 1 tablet (5 mg total) by mouth at bedtime.  Dispense: 30 tablet; Refill: 2     Return precautions given.   Risks, benefits, and alternatives of the medications and treatment plan prescribed today were discussed, and patient expressed understanding.   Education regarding symptom management and diagnosis given to patient on AVS either electronically or printed.  Return in about 4 months (around 11/28/2022).  Rennie Plowman, FNP  Subjective:    Patient ID: Margarette Asal, female    DOB: 1955/03/17, 67 y.o.   MRN: [id number]  CC: Kaleesha Kallal is a 67 y.o. female who presents today for follow up.   HPI: She states yesterday she had stomach upset and for the past 2 hours that she is felt nauseated.  She has only eaten yogurt today.   She did have a headache yesterday in which she took her Imitrex with resolution.  Denies abdominal pain, fever, chills, CP, palpitations, cough, diarrhea, dysuria, urinary frequency.  She has not vomited.   She is compliant with azelastine with modest improvement in left tinnitus.  She does not feel seeing [facility] at this time is necessary   Allergies: Zomepirac, Amoxicillin-pot clavulanate, and Other Current Outpatient Medications on File Prior to Visit  Medication Sig Dispense Refill    amLODipine (NORVASC) 5 MG tablet Take 1 tablet (5 mg total) by mouth daily. 90 tablet 3   Ascorbic Acid (VITAMIN C PO) Take 1 tablet by mouth daily.     azelastine (ASTELIN) 0.1 % nasal spray Place 1 spray into both nostrils 2 (two) times daily. Use in each nostril as directed 30 mL 4   buPROPion (WELLBUTRIN XL) 300 MG 24 hr tablet TAKE 1 TABLET BY MOUTH DAILY 90 tablet 1   calcium carbonate (OS-CAL - DOSED IN MG OF ELEMENTAL CALCIUM) 1250 (500 Ca) MG tablet Take 1 tablet by mouth daily.     denosumab (PROLIA) 60 MG/ML SOSY injection Inject 60 mg into the skin every 6 (six) months.     fluorouracil (EFUDEX) 5 % cream Apply twice daily to right forehead, right upper lip, left temple for 7 days 40 g 1   Ivermectin (SOOLANTRA) 1 % CREA APPLY THIN LAYER TO FACE DAILY 45 g 5   letrozole (FEMARA) 2.5 MG tablet TAKE ONE TABLET BY MOUTH EVERY DAY 90 tablet 3   levothyroxine (SYNTHROID) 100 MCG tablet TAKE 1 TABLET EVERY DAY ON EMPTY STOMACHWITH A GLASS OF WATER AT LEAST 30-60 MINBEFORE BREAKFAST (Patient taking differently: Take 100 mcg by mouth daily before breakfast.) 90 tablet 0   Multiple Vitamins-Minerals (MULTIVITAMIN ADULT PO) Take 1 tablet by mouth daily.     mupirocin ointment (BACTROBAN) 2 % Apply to skin qd-bid 22 g 1   omeprazole (PRILOSEC) 20 MG capsule Take 1 capsule (20  mg total) by mouth daily. 30 capsule 0   rosuvastatin (CRESTOR) 5 MG tablet Take 1 tablet (5 mg total) by mouth every evening. 90 tablet 3   SUMAtriptan (IMITREX) 50 MG tablet May repeat in 2 hours if headache persists or recurs. 15 tablet 2   triamcinolone (KENALOG) 0.1 % paste Apply twice daily to ulcers in mouth. Dry area before application 5 g 2   albuterol (VENTOLIN HFA) 108 (90 Base) MCG/ACT inhaler Inhale 2 puffs into the lungs every 6 (six) hours as needed for wheezing or shortness of breath. (Patient not taking: Reported on 07/29/2022) 8 g 0   No current facility-administered medications on file prior to visit.     Review of Systems  Constitutional:  Negative for chills, fatigue and fever.  Respiratory:  Negative for cough.   Cardiovascular:  Negative for chest pain, palpitations and leg swelling.  Gastrointestinal:  Positive for nausea. Negative for abdominal pain, constipation and vomiting.  Genitourinary:  Negative for dysuria.      Objective:    BP 130/80   Pulse 78   Temp 98 F (36.7 C) (Temporal)   Ht 5' 3.6" (1.615 m)   Wt 131 lb (59.4 kg)   SpO2 97%   BMI 22.77 kg/m  BP Readings from Last 3 Encounters:  07/29/22 130/80  06/30/22 138/86  05/25/22 122/76   Wt Readings from Last 3 Encounters:  07/29/22 131 lb (59.4 kg)  05/25/22 132 lb 12.8 oz (60.2 kg)  04/14/22 133 lb (60.3 kg)    Physical Exam Vitals reviewed.  Constitutional:      Appearance: Normal appearance. She is well-developed.  Eyes:     Conjunctiva/sclera: Conjunctivae normal.  Cardiovascular:     Rate and Rhythm: Normal rate and regular rhythm.     Pulses: Normal pulses.     Heart sounds: Normal heart sounds.  Pulmonary:     Effort: Pulmonary effort is normal.     Breath sounds: Normal breath sounds. No wheezing, rhonchi or rales.  Abdominal:     General: Bowel sounds are normal. There is no distension.     Palpations: Abdomen is soft. Abdomen is not rigid. There is no fluid wave or mass.     Tenderness: There is no abdominal tenderness. There is no guarding or rebound.  Skin:    General: Skin is warm and dry.  Neurological:     Mental Status: She is alert.  Psychiatric:        Speech: Speech normal.        Behavior: Behavior normal.        Thought Content: Thought content normal.

## 2022-07-29 NOTE — Assessment & Plan Note (Signed)
Afebrile.  No abdominal pain on exam today. Etiology of nausea unclear at this time.  Strict return precautions given to patient today that if nausea were to persist or certainly she develops further symptoms including fever,  abdominal pain to report to emergency room or urgent care over the weekend for in person evaluation

## 2022-07-29 NOTE — Patient Instructions (Signed)
I have sent in Zofran for you.  Please ensure you eat something when you get home such as saltine crackers.  Stay vigilant and let me know if symptoms were to worsen or persist.

## 2022-08-05 ENCOUNTER — Encounter: Payer: Self-pay | Admitting: Internal Medicine

## 2022-08-05 ENCOUNTER — Other Ambulatory Visit (HOSPITAL_COMMUNITY): Payer: Self-pay

## 2022-08-17 ENCOUNTER — Encounter: Payer: PPO | Admitting: Dermatology

## 2022-08-18 ENCOUNTER — Telehealth: Payer: Self-pay | Admitting: Pharmacy Technician

## 2022-08-18 ENCOUNTER — Other Ambulatory Visit (HOSPITAL_COMMUNITY): Payer: Self-pay

## 2022-08-18 ENCOUNTER — Encounter: Payer: Self-pay | Admitting: Internal Medicine

## 2022-08-18 NOTE — Telephone Encounter (Signed)
Pt called back. She was able to get the medication. She said the pharmacy bypassed that and filled it for her.

## 2022-08-18 NOTE — Telephone Encounter (Addendum)
Pharmacy Patient Advocate Encounter   Received notification from CoverMyMeds that prior authorization for Ondansetron 4mg  is required/requested.   Insurance verification completed.   The patient is insured through [employer]/ [employer] .   Per test claim: PA started via CoverMyMeds. KEY [id number]     Attempted to call pt, left HIPAA compliant VoiceMail requesting a return call. Direct office number provided. Call is in regard to if she still needs the Ondansetron or not. It was ordered the end of June for a 10 day supply. Test claim just says missing/invalid day supply.

## 2022-08-24 ENCOUNTER — Ambulatory Visit: Payer: PPO | Admitting: Family

## 2022-09-22 ENCOUNTER — Encounter: Payer: PPO | Admitting: Dermatology

## 2022-10-12 ENCOUNTER — Inpatient Hospital Stay: Payer: PPO | Admitting: Internal Medicine

## 2022-10-12 ENCOUNTER — Encounter: Payer: Self-pay | Admitting: Internal Medicine

## 2022-10-12 ENCOUNTER — Inpatient Hospital Stay: Payer: PPO

## 2022-10-12 ENCOUNTER — Ambulatory Visit
Admission: RE | Admit: 2022-10-12 | Discharge: 2022-10-12 | Disposition: A | Discharge status: Home / Self Care | Payer: PPO | Source: Ambulatory Visit | Attending: Radiation Oncology | Admitting: Radiation Oncology

## 2022-10-12 VITALS — BP 130/69 | HR 67 | Temp 97.7°F | Ht 63.6 in | Wt 131.2 lb

## 2022-10-12 DIAGNOSIS — Z923 Personal history of irradiation: Secondary | ICD-10-CM | POA: Insufficient documentation

## 2022-10-12 DIAGNOSIS — Z85828 Personal history of other malignant neoplasm of skin: Secondary | ICD-10-CM | POA: Insufficient documentation

## 2022-10-12 DIAGNOSIS — E039 Hypothyroidism, unspecified: Secondary | ICD-10-CM | POA: Insufficient documentation

## 2022-10-12 DIAGNOSIS — R232 Flushing: Secondary | ICD-10-CM | POA: Insufficient documentation

## 2022-10-12 DIAGNOSIS — M810 Age-related osteoporosis without current pathological fracture: Secondary | ICD-10-CM | POA: Insufficient documentation

## 2022-10-12 DIAGNOSIS — C50411 Malignant neoplasm of upper-outer quadrant of right female breast: Secondary | ICD-10-CM

## 2022-10-12 DIAGNOSIS — Z801 Family history of malignant neoplasm of trachea, bronchus and lung: Secondary | ICD-10-CM | POA: Insufficient documentation

## 2022-10-12 DIAGNOSIS — Z170 Estrogen receptor positive status [ER+]: Secondary | ICD-10-CM

## 2022-10-12 DIAGNOSIS — G8929 Other chronic pain: Secondary | ICD-10-CM | POA: Insufficient documentation

## 2022-10-12 DIAGNOSIS — Z9221 Personal history of antineoplastic chemotherapy: Secondary | ICD-10-CM | POA: Insufficient documentation

## 2022-10-12 DIAGNOSIS — Z79811 Long term (current) use of aromatase inhibitors: Secondary | ICD-10-CM | POA: Insufficient documentation

## 2022-10-12 DIAGNOSIS — Z79899 Other long term (current) drug therapy: Secondary | ICD-10-CM | POA: Insufficient documentation

## 2022-10-12 DIAGNOSIS — I10 Essential (primary) hypertension: Secondary | ICD-10-CM | POA: Insufficient documentation

## 2022-10-12 LAB — CBC WITH DIFFERENTIAL (CANCER CENTER ONLY)
Abs Immature Granulocytes: 0.01 10*3/uL (ref 0.00–0.07)
Basophils Absolute: 0 10*3/uL (ref 0.0–0.1)
Basophils Relative: 1 %
Eosinophils Absolute: 0.1 10*3/uL (ref 0.0–0.5)
Eosinophils Relative: 3 %
HCT: 39.4 % (ref 36.0–46.0)
Hemoglobin: 13 g/dL (ref 12.0–15.0)
Immature Granulocytes: 0 %
Lymphocytes Relative: 26 %
Lymphs Abs: 1.3 10*3/uL (ref 0.7–4.0)
MCH: 31.4 pg (ref 26.0–34.0)
MCHC: 33 g/dL (ref 30.0–36.0)
MCV: 95.2 fL (ref 80.0–100.0)
Monocytes Absolute: 0.5 10*3/uL (ref 0.1–1.0)
Monocytes Relative: 10 %
Neutro Abs: 3 10*3/uL (ref 1.7–7.7)
Neutrophils Relative %: 60 %
Platelet Count: 246 10*3/uL (ref 150–400)
RBC: 4.14 MIL/uL (ref 3.87–5.11)
RDW: 12.2 % (ref 11.5–15.5)
WBC Count: 5 10*3/uL (ref 4.0–10.5)
nRBC: 0 % (ref 0.0–0.2)

## 2022-10-12 LAB — CMP (CANCER CENTER ONLY)
ALT: 18 U/L (ref 0–44)
AST: 19 U/L (ref 15–41)
Albumin: 4 g/dL (ref 3.5–5.0)
Alkaline Phosphatase: 50 U/L (ref 38–126)
Anion gap: 6 (ref 5–15)
BUN: 12 mg/dL (ref 8–23)
CO2: 27 mmol/L (ref 22–32)
Calcium: 9 mg/dL (ref 8.9–10.3)
Chloride: 100 mmol/L (ref 98–111)
Creatinine: 0.82 mg/dL (ref 0.44–1.00)
GFR, Estimated: >60 mL/min (ref >60)
Glucose, Bld: 124 mg/dL — ABNORMAL HIGH (ref 70–99)
Potassium: 3.8 mmol/L (ref 3.5–5.1)
Sodium: 133 mmol/L — ABNORMAL LOW (ref 135–145)
Total Bilirubin: 0.6 mg/dL (ref 0.3–1.2)
Total Protein: 6.9 g/dL (ref 6.5–8.1)

## 2022-10-12 LAB — VITAMIN D 25 HYDROXY (VIT D DEFICIENCY, FRACTURES): Vit D, 25-Hydroxy: 40.76 ng/mL (ref 30–100)

## 2022-10-12 MED ORDER — DENOSUMAB 60 MG/ML ~~LOC~~ SOSY
60.0000 mg | PREFILLED_SYRINGE | Freq: Once | SUBCUTANEOUS | Status: AC
  Administered 2022-10-12: 60 mg via SUBCUTANEOUS
  Filled 2022-10-12: qty 1

## 2022-10-12 NOTE — Progress Notes (Signed)
Has some trouble sleeping, takes [place].

## 2022-10-12 NOTE — Progress Notes (Signed)
Radiation Oncology Follow up Note  Name: Jennifer Clayton   Date:   10/12/2022 MRN:  [id number] DOB: 1955/12/10    This 67 y.o. female presents to the clinic today for 3-year follow-up status post whole breast radiation to her right breast for stage I ER/PR positive invasive mammary carcinoma.  REFERRING PROVIDER: Allegra Grana, FNP  HPI: Patient is a 67 year old female now out 3 years having completed whole breast radiation to her right breast for stage I ER/PR positive invasive mammary carcinoma.  Seen today in routine follow-up she is doing well.  Specifically denies breast tenderness cough or bone pain..  She had mammograms back in April which I have reviewed were BI-RADS 2 benign.  She is currently on Femara tolerating it well without side effect.  COMPLICATIONS OF TREATMENT: none  FOLLOW UP COMPLIANCE: keeps appointments   PHYSICAL EXAM:  There were no vitals taken for this visit. Lungs are clear to A&P cardiac examination essentially unremarkable with regular rate and rhythm. No dominant mass or nodularity is noted in either breast in 2 positions examined. Incision is well-healed. No axillary or supraclavicular adenopathy is appreciated. Cosmetic result is excellent.  Well-developed well-nourished patient in NAD. HEENT reveals PERLA, EOMI, discs not visualized.  Oral cavity is clear. No oral mucosal lesions are identified. Neck is clear without evidence of cervical or supraclavicular adenopathy. Lungs are clear to A&P. Cardiac examination is essentially unremarkable with regular rate and rhythm without murmur rub or thrill. Abdomen is benign with no organomegaly or masses noted. Motor sensory and DTR levels are equal and symmetric in the upper and lower extremities. Cranial nerves II through XII are grossly intact. Proprioception is intact. No peripheral adenopathy or edema is identified. No motor or sensory levels are noted. Crude visual fields are within normal range.  RADIOLOGY  RESULTS: Mammograms reviewed compatible with above-stated findings  PLAN: Present time patient is doing well now out 3 years with no evidence of disease.  And pleased with her overall progress.  I am turning follow-up care over to medical oncology.  Be happy to reevaluate the patient in future should that be indicated.  Patient is to call with any concerns.  I would like to take this opportunity to thank you for allowing me to participate in the care of your patient.Carmina Miller, MD

## 2022-10-12 NOTE — Assessment & Plan Note (Signed)
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  On adjuvant Letrozole [until summer 2026]. Stable.   # Tolerating well no major side effects noted except for mild joint pains/hot flashes. Mammo-April 2024[[facility]-surgery]- Stable.   # joint pains- G-1 sec to letrozole.  Stable.  # Hot flashes- G-1 sec to letrozole.  Stable.  # OSTEOPOROSIS [BMD-MARCH 2021]-T score -3; On Prolia [2021]; SEP  2023- T-score of -2.9.  Overall stable.  Continue Vit D 1000/day. Continue calcium plus vitamin D plus exercise program. Continue lifting weights/physical activity.  Will continue prolia for now.  We will repeat bone density again in sep, 2025. Today Ca- 8.5- recommend increasing the ca+vit D BID.   # DISPOSITION: # Prolia today # Follow up in 6 months- MD; labs-cbc; cmp; Vit D 25 OH- prolia SQ; Dr.B

## 2022-10-12 NOTE — Progress Notes (Signed)
[facility] CONSULT NOTE  Patient Care Team: Allegra Grana, FNP as PCP - General (Family Medicine) Scarlett Presto, RN (Inactive) as Oncology Nurse Navigator Carmina Miller, MD as Radiation Oncologist (Radiation Oncology) Lemar Livings Merrily Pew, MD as Consulting Physician (General Surgery) Earna Coder, MD as Consulting Physician (Internal Medicine)  CHIEF COMPLAINTS/PURPOSE OF CONSULTATION: Breast cancer  #  Oncology History Overview Note  # MARCH 2021-right breast 11 o'clock position-s/p lumpectomy; SLNBx [Dr. Doristine Counter ]pT1c (15 mm) p N0-grade-1; ER/PR positive HER-2 negative; Oncotype low risk [risk of recurrence is 5%]-no chemotherapy; s/p RT [08/12/2019]- Oncotyoe- 5% risk of recurrence. No chemo.   #Mid July 2021-letrozole  #Osteoporosis 2021-Prolia  # SURVIVORSHIP:   # GENETICS:   DIAGNOSIS: Right breast cancer  STAGE:   1      ;  GOALS: Cure  CURRENT/MOST RECENT THERAPY : Letrozole    Carcinoma of upper-outer quadrant of right breast in female, estrogen receptor positive (HCC)  05/22/2019 Initial Diagnosis   Carcinoma of upper-outer quadrant of right breast in female, estrogen receptor positive (HCC)     HISTORY OF PRESENTING ILLNESS: Alone.  Ambulating independently.  Jennifer Clayton 67 y.o.  female stage I breast cancer ER/PR positive Her-2 negative on letrozole/ prolia is here for follow-up.  Patient has some trouble sleeping, takes [place].    Taking letrozole without side effects. Appetite is fairly good. Energy is good. Mild hot flashes.  Mild chronic joint pains. Does have aching in bilateral LE's. Not any worse.  No shortness of breath.   Review of Systems  Constitutional:  Negative for chills, diaphoresis, fever, malaise/fatigue and weight loss.  HENT:  Negative for nosebleeds and sore throat.   Eyes:  Negative for double vision.  Respiratory:  Negative for cough, hemoptysis, sputum production, shortness of breath and wheezing.    Cardiovascular:  Negative for chest pain, palpitations, orthopnea and leg swelling.  Gastrointestinal:  Negative for abdominal pain, blood in stool, constipation, diarrhea, heartburn, melena, nausea and vomiting.  Genitourinary:  Negative for dysuria, frequency and urgency.  Musculoskeletal:  Positive for joint pain. Negative for back pain.  Skin: Negative.  Negative for itching and rash.  Neurological:  Negative for dizziness, tingling, focal weakness, weakness and headaches.  Endo/Heme/Allergies:  Does not bruise/bleed easily.  Psychiatric/Behavioral:  Negative for depression. The patient is not nervous/anxious and does not have insomnia.      MEDICAL HISTORY:  Past Medical History:  Diagnosis Date   Basal cell carcinoma 02/10/2022   Left lower eyelid. Nodular. Mohs 04/27/22   Breast cancer (HCC) 04/2019   right breast   Cancer (HCC)    breast cancer   Dyspnea    H/O radioactive iodine thyroid ablation    Headache    MIGRAINES   History of actinic keratosis 05/30/2018   right posterior shoulder   History of actinic keratosis 03/31/2020   right forearm   History of squamous cell carcinoma 10/19/2012   right anterior lower leg / excised 10/31/2012   Hypertension    Hypothyroidism    Personal history of radiation therapy     SURGICAL HISTORY: Past Surgical History:  Procedure Laterality Date   ABDOMINAL HYSTERECTOMY     Age 44, for bleeding and cyst; NO ovaries   BREAST BIOPSY Right 04/25/2019   positive/ done in Dr. Rutherford Nail office   BREAST LUMPECTOMY Right 05/13/2019   positive   BREAST LUMPECTOMY WITH SENTINEL LYMPH NODE BIOPSY Right 05/13/2019   Procedure: BREAST LUMPECTOMY WITH SENTINEL LYMPH  NODE BX;  Surgeon: Earline Mayotte, MD;  Location: [facility] ORS;  Service: General;  Laterality: Right;   COLONOSCOPY WITH PROPOFOL N/A 12/16/2021   Procedure: COLONOSCOPY WITH PROPOFOL;  Surgeon: Midge Minium, MD;  Location: [facility] ENDOSCOPY;  Service: Endoscopy;  Laterality:  N/A;   VAGINAL DELIVERY      SOCIAL HISTORY: Social History   Socioeconomic History   Marital status: Married    Spouse name: Not on file   Number of children: Not on file   Years of education: Not on file   Highest education level: Not on file  Occupational History   Not on file  Tobacco Use   Smoking status: Never   Smokeless tobacco: Never  Vaping Use   Vaping status: Never Used  Substance and Sexual Activity   Alcohol use: Yes    Alcohol/week: 1.0 standard drink of alcohol    Types: 1 Glasses of wine per week    Comment: rare   Drug use: Never   Sexual activity: Not on file  Other Topics Concern   Not on file  Social History Narrative   Lives in [place] with husband and son and 4 grandchildren.      Work - Full time in [employer] - regular diet      Exercise - typically daily at [facility] for 45-1min, lifting some weight, ellipitical, walks.       Never smoked; social alcohol.       Social Determinants of Health   Financial Resource Strain: Low Risk  (04/14/2022)   Overall Financial Resource Strain (CARDIA)    Difficulty of Paying Living Expenses: Not hard at all  Food Insecurity: No Food Insecurity (04/14/2022)   Hunger Vital Sign    Worried About Running Out of Food in the Last Year: Never true    Ran Out of Food in the Last Year: Never true  Transportation Needs: No Transportation Needs (04/14/2022)   PRAPARE - Administrator, Civil Service (Medical): No    Lack of Transportation (Non-Medical): No  Physical Activity: Sufficiently Active (04/14/2022)   Exercise Vital Sign    Days of Exercise per Week: 5 days    Minutes of Exercise per Session: 60 min  Stress: No Stress Concern Present (04/14/2022)   [employer] of Occupational Health - Occupational Stress Questionnaire    Feeling of Stress : Not at all  Social Connections: Unknown (04/14/2022)   Social Connection and Isolation Panel [NHANES]    Frequency of Communication with  Friends and Family: More than three times a week    Frequency of Social Gatherings with Friends and Family: More than three times a week    Attends Religious Services: Not on file    Active Member of Clubs or Organizations: Not on file    Attends Banker Meetings: Not on file    Marital Status: Married  Intimate Partner Violence: Not At Risk (04/14/2022)   Humiliation, Afraid, Rape, and Kick questionnaire    Fear of Current or Ex-Partner: No    Emotionally Abused: No    Physically Abused: No    Sexually Abused: No    FAMILY HISTORY: Family History  Problem Relation Age of Onset   Cancer Mother        lung, non-smoker   Dementia Father        related to traumatic brain injury   Breast cancer Neg Hx     ALLERGIES:  is allergic  to zomepirac, amoxicillin-pot clavulanate, and other.  MEDICATIONS:  Current Outpatient Medications  Medication Sig Dispense Refill   amLODipine (NORVASC) 5 MG tablet Take 1 tablet (5 mg total) by mouth daily. 90 tablet 3   Ascorbic Acid (VITAMIN C PO) Take 1 tablet by mouth daily.     azelastine (ASTELIN) 0.1 % nasal spray Place 1 spray into both nostrils 2 (two) times daily. Use in each nostril as directed 30 mL 4   buPROPion (WELLBUTRIN XL) 300 MG 24 hr tablet TAKE 1 TABLET BY MOUTH DAILY 90 tablet 1   calcium carbonate (OS-CAL - DOSED IN MG OF ELEMENTAL CALCIUM) 1250 (500 Ca) MG tablet Take 1 tablet by mouth daily.     denosumab (PROLIA) 60 MG/ML SOSY injection Inject 60 mg into the skin every 6 (six) months.     fluorouracil (EFUDEX) 5 % cream Apply twice daily to right forehead, right upper lip, left temple for 7 days 40 g 1   Ivermectin (SOOLANTRA) 1 % CREA APPLY THIN LAYER TO FACE DAILY 45 g 5   letrozole (FEMARA) 2.5 MG tablet TAKE ONE TABLET BY MOUTH EVERY DAY 90 tablet 3   levothyroxine (SYNTHROID) 100 MCG tablet TAKE 1 TABLET EVERY DAY ON EMPTY STOMACHWITH A GLASS OF WATER AT LEAST 30-60 MINBEFORE BREAKFAST (Patient taking  differently: Take 100 mcg by mouth daily before breakfast.) 90 tablet 0   Multiple Vitamins-Minerals (MULTIVITAMIN ADULT PO) Take 1 tablet by mouth daily.     mupirocin ointment (BACTROBAN) 2 % Apply to skin qd-bid 22 g 1   omeprazole (PRILOSEC) 20 MG capsule Take 1 capsule (20 mg total) by mouth daily. 30 capsule 0   ondansetron (ZOFRAN) 4 MG tablet Take 1 tablet (4 mg total) by mouth every 8 (eight) hours as needed for nausea or vomiting. 30 tablet 0   rosuvastatin (CRESTOR) 5 MG tablet Take 1 tablet (5 mg total) by mouth every evening. 90 tablet 3   SUMAtriptan (IMITREX) 50 MG tablet May repeat in 2 hours if headache persists or recurs. 15 tablet 2   triamcinolone (KENALOG) 0.1 % paste Apply twice daily to ulcers in mouth. Dry area before application 5 g 2   zolpidem (AMBIEN) 5 MG tablet Take 1 tablet (5 mg total) by mouth at bedtime. 30 tablet 2   albuterol (VENTOLIN HFA) 108 (90 Base) MCG/ACT inhaler Inhale 2 puffs into the lungs every 6 (six) hours as needed for wheezing or shortness of breath. (Patient not taking: Reported on 07/29/2022) 8 g 0   No current facility-administered medications for this visit.      Marland Kitchen  PHYSICAL EXAMINATION: ECOG PERFORMANCE STATUS: 0 - Asymptomatic  Vitals:   10/12/22 0941  BP: 130/69  Pulse: 67  Temp: 97.7 F (36.5 C)  SpO2: 100%    Filed Weights   10/12/22 0941  Weight: 131 lb 3.2 oz (59.5 kg)     Physical Exam HENT:     Head: Normocephalic and atraumatic.     Mouth/Throat:     Pharynx: No oropharyngeal exudate.  Eyes:     Pupils: Pupils are equal, round, and reactive to light.  Cardiovascular:     Rate and Rhythm: Normal rate and regular rhythm.  Pulmonary:     Effort: Pulmonary effort is normal. No respiratory distress.     Breath sounds: Normal breath sounds. No wheezing.  Abdominal:     General: Bowel sounds are normal. There is no distension.     Palpations: Abdomen is soft. There  is no mass.     Tenderness: There is no  abdominal tenderness. There is no guarding or rebound.  Musculoskeletal:        General: No tenderness. Normal range of motion.     Cervical back: Normal range of motion and neck supple.  Skin:    General: Skin is warm.  Neurological:     Mental Status: She is alert and oriented to person, place, and time.  Psychiatric:        Mood and Affect: Affect normal.      LABORATORY DATA:  I have reviewed the data as listed Lab Results  Component Value Date   WBC 5.0 10/12/2022   HGB 13.0 10/12/2022   HCT 39.4 10/12/2022   MCV 95.2 10/12/2022   PLT 246 10/12/2022   Recent Labs    12/06/21 2151 04/12/22 0954 10/12/22 0943  NA 138 134* 133*  K 4.7 4.1 3.8  CL 104 100 100  CO2 26 27 27   GLUCOSE 108* 117* 124*  BUN 13 9 12   CREATININE 0.98 0.81 0.82  CALCIUM 9.5 8.6* 9.0  GFRNONAA >60 >60 >60  PROT  --   --  6.9  ALBUMIN  --   --  4.0  AST  --   --  19  ALT  --   --  18  ALKPHOS  --   --  50  BILITOT  --   --  0.6    RADIOGRAPHIC STUDIES: I have personally reviewed the radiological images as listed and agreed with the findings in the report. No results found.  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  On adjuvant Letrozole [until summer 2026]. Stable.   # Tolerating well no major side effects noted except for mild joint pains/hot flashes. Mammo-April 2024[[facility]-surgery]- Stable.   # joint pains- G-1 sec to letrozole.  Stable.  # Hot flashes- G-1 sec to letrozole.  Stable.  # OSTEOPOROSIS [BMD-MARCH 2021]-T score -3; On Prolia [2021]; SEP  2023- T-score of -2.9.  Overall stable.  Continue Vit D 1000/day. Continue calcium plus vitamin D plus exercise program. Continue lifting weights/physical activity.  Will continue prolia for now.  We will repeat bone density again in sep, 2025. Today Ca- 8.5-  recommend increasing the ca+vit D BID.   # DISPOSITION: # Prolia today # Follow up in 6 months- MD; labs-cbc; cmp; Vit D 25 OH- prolia SQ; Dr.B   All questions were answered. The patient/family knows to call the clinic with any problems, questions or concerns.    Earna Coder, MD 10/12/2022 12:15 PM

## 2022-10-13 ENCOUNTER — Other Ambulatory Visit: Payer: PPO

## 2022-10-13 ENCOUNTER — Ambulatory Visit: Payer: PPO | Admitting: Internal Medicine

## 2022-10-13 ENCOUNTER — Ambulatory Visit: Payer: PPO

## 2022-12-05 ENCOUNTER — Ambulatory Visit: Payer: PPO | Admitting: Family

## 2022-12-06 ENCOUNTER — Other Ambulatory Visit: Payer: Self-pay | Admitting: Family

## 2022-12-08 ENCOUNTER — Encounter: Payer: Self-pay | Admitting: Family

## 2022-12-08 ENCOUNTER — Ambulatory Visit: Payer: PPO | Admitting: Family

## 2022-12-08 VITALS — BP 130/74 | HR 77 | Temp 97.9°F | Ht 63.0 in | Wt 127.2 lb

## 2022-12-08 DIAGNOSIS — E785 Hyperlipidemia, unspecified: Secondary | ICD-10-CM

## 2022-12-08 DIAGNOSIS — G4700 Insomnia, unspecified: Secondary | ICD-10-CM | POA: Diagnosis not present

## 2022-12-08 MED ORDER — ZOLPIDEM TARTRATE 5 MG PO TABS
5.0000 mg | ORAL_TABLET | Freq: Every day | ORAL | 2 refills | Status: DC
Start: 2022-12-08 — End: 2023-02-27

## 2022-12-08 NOTE — Assessment & Plan Note (Signed)
Chronic, stable.  Continue Ambien 5mg  qhs

## 2022-12-08 NOTE — Patient Instructions (Signed)
Take 0.5 to 5mg  melatonin at 7pm with dinner -this is when natural melatonin will start to increase You may also trial melatonin combination over the counter supplement such as Sleep#3 or Qunol Sleep 5 in 1

## 2022-12-08 NOTE — Progress Notes (Signed)
Assessment & Plan:  Hyperlipidemia, unspecified hyperlipidemia type Assessment & Plan: Lab Results  Component Value Date   LDLCALC 68 12/01/2021   Pending lipid panel.  Anticipate stable.  Continue Crestor 5 mg daily  Orders: -     Lipid panel; Future  Insomnia, unspecified type Assessment & Plan: Chronic, stable.  Continue Ambien 5mg  qhs  Orders: -     Zolpidem Tartrate; Take 1 tablet (5 mg total) by mouth at bedtime.  Dispense: 30 tablet; Refill: 2     Return precautions given.   Risks, benefits, and alternatives of the medications and treatment plan prescribed today were discussed, and patient expressed understanding.   Education regarding symptom management and diagnosis given to patient on AVS either electronically or printed.  Return in about 3 months (around 03/10/2023) for Fasting labs in 2-3 weeks.  Rennie Plowman, FNP  Subjective:    Patient ID: Margarette Asal, female    DOB: September 30, 1955, 67 y.o.   MRN: [id number]  CC: Swayzie Choate is a 67 y.o. female who presents today for follow up.   HPI: Feels well today.  No new complaints.  Here for medication refill.  She request Ambien 5 mg.  This medication has been quite helpful for her.  She remains compliant with Crestor 5 mg    Allergies: Zomepirac, Amoxicillin-pot clavulanate, and Other Current Outpatient Medications on File Prior to Visit  Medication Sig Dispense Refill   amLODipine (NORVASC) 5 MG tablet Take 1 tablet (5 mg total) by mouth daily. 90 tablet 3   Ascorbic Acid (VITAMIN C PO) Take 1 tablet by mouth daily.     buPROPion (WELLBUTRIN XL) 300 MG 24 hr tablet TAKE 1 TABLET BY MOUTH DAILY 90 tablet 1   calcium carbonate (OS-CAL - DOSED IN MG OF ELEMENTAL CALCIUM) 1250 (500 Ca) MG tablet Take 1 tablet by mouth daily.     denosumab (PROLIA) 60 MG/ML SOSY injection Inject 60 mg into the skin every 6 (six) months.     fluorouracil (EFUDEX) 5 % cream Apply twice daily to right forehead, right upper  lip, left temple for 7 days 40 g 1   Ivermectin (SOOLANTRA) 1 % CREA APPLY THIN LAYER TO FACE DAILY 45 g 5   letrozole (FEMARA) 2.5 MG tablet TAKE ONE TABLET BY MOUTH EVERY DAY 90 tablet 3   levothyroxine (SYNTHROID) 100 MCG tablet TAKE 1 TABLET EVERY DAY ON EMPTY STOMACHWITH A GLASS OF WATER AT LEAST 30-60 MINBEFORE BREAKFAST (Patient taking differently: Take 100 mcg by mouth daily before breakfast.) 90 tablet 0   Multiple Vitamins-Minerals (MULTIVITAMIN ADULT PO) Take 1 tablet by mouth daily.     mupirocin ointment (BACTROBAN) 2 % Apply to skin qd-bid 22 g 1   omeprazole (PRILOSEC) 20 MG capsule Take 1 capsule (20 mg total) by mouth daily. 30 capsule 0   ondansetron (ZOFRAN) 4 MG tablet Take 1 tablet (4 mg total) by mouth every 8 (eight) hours as needed for nausea or vomiting. 30 tablet 0   rosuvastatin (CRESTOR) 5 MG tablet Take 1 tablet (5 mg total) by mouth every evening. 90 tablet 3   SUMAtriptan (IMITREX) 50 MG tablet May repeat in 2 hours if headache persists or recurs. 15 tablet 2   triamcinolone (KENALOG) 0.1 % paste Apply twice daily to ulcers in mouth. Dry area before application 5 g 2   albuterol (VENTOLIN HFA) 108 (90 Base) MCG/ACT inhaler Inhale 2 puffs into the lungs every 6 (six) hours as needed for  wheezing or shortness of breath. (Patient not taking: Reported on 07/29/2022) 8 g 0   No current facility-administered medications on file prior to visit.    Review of Systems  Constitutional:  Negative for chills and fever.  Respiratory:  Negative for cough.   Cardiovascular:  Negative for chest pain and palpitations.  Gastrointestinal:  Negative for nausea and vomiting.      Objective:    BP 130/74   Pulse 77   Temp 97.9 F (36.6 C) (Oral)   Ht 5\' 3"  (1.6 m)   Wt 127 lb 3.2 oz (57.7 kg)   SpO2 99%   BMI 22.53 kg/m  BP Readings from Last 3 Encounters:  12/08/22 130/74  10/12/22 130/69  07/29/22 130/80   Wt Readings from Last 3 Encounters:  12/08/22 127 lb 3.2 oz  (57.7 kg)  10/12/22 131 lb 3.2 oz (59.5 kg)  07/29/22 131 lb (59.4 kg)    Physical Exam Vitals reviewed.  Constitutional:      Appearance: She is well-developed.  Eyes:     Conjunctiva/sclera: Conjunctivae normal.  Cardiovascular:     Rate and Rhythm: Normal rate and regular rhythm.     Pulses: Normal pulses.     Heart sounds: Normal heart sounds.  Pulmonary:     Effort: Pulmonary effort is normal.     Breath sounds: Normal breath sounds. No wheezing, rhonchi or rales.  Skin:    General: Skin is warm and dry.  Neurological:     Mental Status: She is alert.  Psychiatric:        Speech: Speech normal.        Behavior: Behavior normal.        Thought Content: Thought content normal.

## 2022-12-08 NOTE — Assessment & Plan Note (Signed)
Lab Results  Component Value Date   LDLCALC 68 12/01/2021   Pending lipid panel.  Anticipate stable.  Continue Crestor 5 mg daily

## 2022-12-21 ENCOUNTER — Other Ambulatory Visit (INDEPENDENT_AMBULATORY_CARE_PROVIDER_SITE_OTHER): Payer: PPO

## 2022-12-21 DIAGNOSIS — E785 Hyperlipidemia, unspecified: Secondary | ICD-10-CM | POA: Diagnosis not present

## 2022-12-22 ENCOUNTER — Other Ambulatory Visit: Payer: PPO

## 2022-12-22 LAB — LIPID PANEL
Cholesterol: 162 mg/dL (ref 0–200)
HDL: 65 mg/dL (ref >39.00)
LDL Cholesterol: 81 mg/dL (ref 0–99)
NonHDL: 96.62
Total CHOL/HDL Ratio: 2
Triglycerides: 80 mg/dL (ref 0.0–149.0)
VLDL: 16 mg/dL (ref 0.0–40.0)

## 2023-01-02 ENCOUNTER — Encounter: Payer: PPO | Admitting: Dermatology

## 2023-01-16 ENCOUNTER — Other Ambulatory Visit: Payer: Self-pay | Admitting: Family

## 2023-01-16 DIAGNOSIS — R635 Abnormal weight gain: Secondary | ICD-10-CM

## 2023-02-07 DIAGNOSIS — E890 Postprocedural hypothyroidism: Secondary | ICD-10-CM | POA: Diagnosis not present

## 2023-02-13 ENCOUNTER — Ambulatory Visit: Payer: PPO | Admitting: Dermatology

## 2023-02-13 ENCOUNTER — Encounter: Payer: Self-pay | Admitting: Dermatology

## 2023-02-13 DIAGNOSIS — Z85828 Personal history of other malignant neoplasm of skin: Secondary | ICD-10-CM

## 2023-02-13 DIAGNOSIS — Z872 Personal history of diseases of the skin and subcutaneous tissue: Secondary | ICD-10-CM | POA: Diagnosis not present

## 2023-02-13 DIAGNOSIS — Z1283 Encounter for screening for malignant neoplasm of skin: Secondary | ICD-10-CM | POA: Diagnosis not present

## 2023-02-13 DIAGNOSIS — L821 Other seborrheic keratosis: Secondary | ICD-10-CM | POA: Diagnosis not present

## 2023-02-13 DIAGNOSIS — L578 Other skin changes due to chronic exposure to nonionizing radiation: Secondary | ICD-10-CM | POA: Diagnosis not present

## 2023-02-13 DIAGNOSIS — L814 Other melanin hyperpigmentation: Secondary | ICD-10-CM | POA: Diagnosis not present

## 2023-02-13 DIAGNOSIS — D1801 Hemangioma of skin and subcutaneous tissue: Secondary | ICD-10-CM

## 2023-02-13 DIAGNOSIS — L720 Epidermal cyst: Secondary | ICD-10-CM

## 2023-02-13 DIAGNOSIS — W908XXA Exposure to other nonionizing radiation, initial encounter: Secondary | ICD-10-CM | POA: Diagnosis not present

## 2023-02-13 DIAGNOSIS — D229 Melanocytic nevi, unspecified: Secondary | ICD-10-CM

## 2023-02-13 NOTE — Patient Instructions (Addendum)

## 2023-02-13 NOTE — Progress Notes (Signed)
   Follow-Up Visit   Subjective  Jennifer Clayton is a 68 y.o. female who presents for the following: Skin Cancer Screening and Full Body Skin Exam. Hx of SCC. Hx of BCCs. Hx of AKs.   Check spot on right cheek below eye.   The patient presents for Total-Body Skin Exam (TBSE) for skin cancer screening and mole check. The patient has spots, moles and lesions to be evaluated, some may be new or changing and the patient may have concern these could be cancer.    The following portions of the chart were reviewed this encounter and updated as appropriate: medications, allergies, medical history  Review of Systems:  No other skin or systemic complaints except as noted in HPI or Assessment and Plan.  Objective  Well appearing patient in no apparent distress; mood and affect are within normal limits.  A full examination was performed including scalp, head, eyes, ears, nose, lips, neck, chest, axillae, abdomen, back, buttocks, bilateral upper extremities, bilateral lower extremities, hands, feet, fingers, toes, fingernails, and toenails. All findings within normal limits unless otherwise noted below.   Relevant physical exam findings are noted in the Assessment and Plan.    Assessment & Plan   HISTORY OF SQUAMOUS CELL CARCINOMA OF THE SKIN. Right anterior lower leg. Excised 10/31/2012. - No evidence of recurrence today - No lymphadenopathy - Recommend regular full body skin exams - Recommend daily broad spectrum sunscreen SPF 30+ to sun-exposed areas, reapply every 2 hours as needed.  - Call if any new or changing lesions are noted between office visits  HISTORY OF BASAL CELL CARCINOMA OF THE SKIN. Left lower eyelid. Nodular. Mohs 04/27/2022. - No evidence of recurrence today - Recommend regular full body skin exams - Recommend daily broad spectrum sunscreen SPF 30+ to sun-exposed areas, reapply every 2 hours as needed.  - Call if any new or changing lesions are noted between office  visits   SKIN CANCER SCREENING PERFORMED TODAY.  ACTINIC DAMAGE - Chronic condition, secondary to cumulative UV/sun exposure - diffuse scaly erythematous macules with underlying dyspigmentation - Recommend daily broad spectrum sunscreen SPF 30+ to sun-exposed areas, reapply every 2 hours as needed.  - Staying in the shade or wearing long sleeves, sun glasses (UVA+UVB protection) and wide brim hats (4-inch brim around the entire circumference of the hat) are also recommended for sun protection.  - Call for new or changing lesions.  LENTIGINES, SEBORRHEIC KERATOSES, HEMANGIOMAS - Benign normal skin lesions - Benign-appearing - Call for any changes  MELANOCYTIC NEVI - Tan-brown and/or pink-flesh-colored symmetric macules and papules - Benign appearing on exam today - Observation - Call clinic for new or changing moles - Recommend daily use of broad spectrum spf 30+ sunscreen to sun-exposed areas.    Milium - tiny firm white papule at right cheek. - type of cyst - benign - sometimes these will clear with nightly OTC adapalene/Differin 0.1% gel or retinol. - may be extracted if symptomatic - observe    MILIA   MULTIPLE BENIGN NEVI   LENTIGINES   ACTINIC ELASTOSIS   SEBORRHEIC KERATOSES   CHERRY ANGIOMA   Return in about 6 months (around 08/13/2023) for TBSE, HxSCC, HxBCC.  I, Jill Parcell, CMA, am acting as scribe for Boneta Sharps, MD.   Documentation: I have reviewed the above documentation for accuracy and completeness, and I agree with the above.  Boneta Sharps, MD

## 2023-02-14 ENCOUNTER — Encounter: Payer: PPO | Admitting: Dermatology

## 2023-02-15 ENCOUNTER — Encounter: Payer: PPO | Admitting: Dermatology

## 2023-02-26 ENCOUNTER — Other Ambulatory Visit: Payer: Self-pay | Admitting: Family

## 2023-02-26 DIAGNOSIS — G4700 Insomnia, unspecified: Secondary | ICD-10-CM

## 2023-03-01 ENCOUNTER — Other Ambulatory Visit: Payer: Self-pay | Admitting: Internal Medicine

## 2023-03-15 ENCOUNTER — Other Ambulatory Visit: Payer: Self-pay | Admitting: Family

## 2023-03-23 ENCOUNTER — Ambulatory Visit: Payer: PPO | Admitting: Family

## 2023-03-23 ENCOUNTER — Encounter: Payer: Self-pay | Admitting: Family

## 2023-03-23 NOTE — Telephone Encounter (Signed)
Spoke to pt and rescheduled appt for 05/04/23

## 2023-04-04 ENCOUNTER — Other Ambulatory Visit: Payer: Self-pay | Admitting: Family

## 2023-04-04 DIAGNOSIS — Z1231 Encounter for screening mammogram for malignant neoplasm of breast: Secondary | ICD-10-CM

## 2023-04-11 ENCOUNTER — Inpatient Hospital Stay: Payer: PPO | Attending: Internal Medicine

## 2023-04-11 ENCOUNTER — Encounter: Payer: Self-pay | Admitting: Internal Medicine

## 2023-04-11 ENCOUNTER — Inpatient Hospital Stay: Payer: PPO

## 2023-04-11 ENCOUNTER — Inpatient Hospital Stay: Payer: PPO | Admitting: Internal Medicine

## 2023-04-11 VITALS — BP 135/70 | HR 66 | Temp 99.4°F | Resp 16 | Ht 63.0 in | Wt 128.8 lb

## 2023-04-11 DIAGNOSIS — Z801 Family history of malignant neoplasm of trachea, bronchus and lung: Secondary | ICD-10-CM | POA: Insufficient documentation

## 2023-04-11 DIAGNOSIS — M810 Age-related osteoporosis without current pathological fracture: Secondary | ICD-10-CM | POA: Diagnosis not present

## 2023-04-11 DIAGNOSIS — I10 Essential (primary) hypertension: Secondary | ICD-10-CM | POA: Diagnosis not present

## 2023-04-11 DIAGNOSIS — R232 Flushing: Secondary | ICD-10-CM | POA: Diagnosis not present

## 2023-04-11 DIAGNOSIS — E039 Hypothyroidism, unspecified: Secondary | ICD-10-CM | POA: Diagnosis not present

## 2023-04-11 DIAGNOSIS — C50411 Malignant neoplasm of upper-outer quadrant of right female breast: Secondary | ICD-10-CM

## 2023-04-11 DIAGNOSIS — Z85828 Personal history of other malignant neoplasm of skin: Secondary | ICD-10-CM | POA: Diagnosis not present

## 2023-04-11 DIAGNOSIS — M2550 Pain in unspecified joint: Secondary | ICD-10-CM | POA: Insufficient documentation

## 2023-04-11 DIAGNOSIS — Z79811 Long term (current) use of aromatase inhibitors: Secondary | ICD-10-CM | POA: Diagnosis not present

## 2023-04-11 DIAGNOSIS — Z170 Estrogen receptor positive status [ER+]: Secondary | ICD-10-CM | POA: Diagnosis not present

## 2023-04-11 DIAGNOSIS — Z79899 Other long term (current) drug therapy: Secondary | ICD-10-CM | POA: Insufficient documentation

## 2023-04-11 LAB — CMP (CANCER CENTER ONLY)
ALT: 18 U/L (ref 0–44)
AST: 21 U/L (ref 15–41)
Albumin: 4 g/dL (ref 3.5–5.0)
Alkaline Phosphatase: 49 U/L (ref 38–126)
Anion gap: 7 (ref 5–15)
BUN: 10 mg/dL (ref 8–23)
CO2: 26 mmol/L (ref 22–32)
Calcium: 8.7 mg/dL — ABNORMAL LOW (ref 8.9–10.3)
Chloride: 102 mmol/L (ref 98–111)
Creatinine: 0.81 mg/dL (ref 0.44–1.00)
GFR, Estimated: >60 mL/min (ref >60)
Glucose, Bld: 104 mg/dL — ABNORMAL HIGH (ref 70–99)
Potassium: 4.2 mmol/L (ref 3.5–5.1)
Sodium: 135 mmol/L (ref 135–145)
Total Bilirubin: 0.5 mg/dL (ref 0.0–1.2)
Total Protein: 6.9 g/dL (ref 6.5–8.1)

## 2023-04-11 LAB — CBC WITH DIFFERENTIAL (CANCER CENTER ONLY)
Abs Immature Granulocytes: 0.01 10*3/uL (ref 0.00–0.07)
Basophils Absolute: 0 10*3/uL (ref 0.0–0.1)
Basophils Relative: 1 %
Eosinophils Absolute: 0.1 10*3/uL (ref 0.0–0.5)
Eosinophils Relative: 2 %
HCT: 40.3 % (ref 36.0–46.0)
Hemoglobin: 13.2 g/dL (ref 12.0–15.0)
Immature Granulocytes: 0 %
Lymphocytes Relative: 13 %
Lymphs Abs: 0.6 10*3/uL — ABNORMAL LOW (ref 0.7–4.0)
MCH: 31.6 pg (ref 26.0–34.0)
MCHC: 32.8 g/dL (ref 30.0–36.0)
MCV: 96.4 fL (ref 80.0–100.0)
Monocytes Absolute: 0.6 10*3/uL (ref 0.1–1.0)
Monocytes Relative: 13 %
Neutro Abs: 3.5 10*3/uL (ref 1.7–7.7)
Neutrophils Relative %: 71 %
Platelet Count: 217 10*3/uL (ref 150–400)
RBC: 4.18 MIL/uL (ref 3.87–5.11)
RDW: 11.9 % (ref 11.5–15.5)
WBC Count: 4.8 10*3/uL (ref 4.0–10.5)
nRBC: 0 % (ref 0.0–0.2)

## 2023-04-11 MED ORDER — DENOSUMAB 60 MG/ML ~~LOC~~ SOSY
60.0000 mg | PREFILLED_SYRINGE | Freq: Once | SUBCUTANEOUS | Status: AC
  Administered 2023-04-11: 60 mg via SUBCUTANEOUS

## 2023-04-11 NOTE — Assessment & Plan Note (Signed)
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  On adjuvant Letrozole [until summer 2026]. Stable.   # Tolerating well no major side effects noted except for mild joint pains/hot flashes. Mammo-April 2024[[facility]-surgery]- Stable.  # joint pains- G-1 sec to letrozole.  Stable.  # Hot flashes- G-1 sec to letrozole. Stable.  # OSTEOPOROSIS [BMD-MARCH 2021]-T score -3; On Prolia [2021]; SEP  2023- T-score of -2.9.  Overall stable.  Continue Vit D 1000/day. Continue calcium plus vitamin D plus exercise program. Continue lifting weights/physical activity.  Will continue prolia for now.  We will repeat bone density again in sep, 2025; ordered today- consider duration of prolia. Today Ca- 8.5- recommend increasing the ca+vit D BID- stable.   # DISPOSITION: # Prolia today # Follow up in 6 months- MD; labs-cbc; cmp; Vit D 25 OH- prolia SQ; BMD-Dr.B

## 2023-04-11 NOTE — Progress Notes (Signed)
 No concerns today

## 2023-04-11 NOTE — Progress Notes (Signed)
 [facility] CONSULT NOTE  Patient Care Team: Allegra Grana, FNP as PCP - General (Family Medicine) Scarlett Presto, RN (Inactive) as Oncology Nurse Navigator Carmina Miller, MD as Radiation Oncologist (Radiation Oncology) Lemar Livings Merrily Pew, MD as Consulting Physician (General Surgery) Earna Coder, MD as Consulting Physician (Internal Medicine)  CHIEF COMPLAINTS/PURPOSE OF CONSULTATION: Breast cancer  #  Oncology History Overview Note  # MARCH 2021-right breast 11 o'clock position-s/p lumpectomy; SLNBx [Dr. Doristine Counter ]pT1c (15 mm) p N0-grade-1; ER/PR positive HER-2 negative; Oncotype low risk [risk of recurrence is 5%]-no chemotherapy; s/p RT [08/12/2019]- Oncotyoe- 5% risk of recurrence. No chemo.   #Mid July 2021-letrozole  #Osteoporosis 2021-Prolia  # SURVIVORSHIP:   # GENETICS:   DIAGNOSIS: Right breast cancer  STAGE:   1      ;  GOALS: Cure  CURRENT/MOST RECENT THERAPY : Letrozole    Carcinoma of upper-outer quadrant of right breast in female, estrogen receptor positive (HCC)  05/22/2019 Initial Diagnosis   Carcinoma of upper-outer quadrant of right breast in female, estrogen receptor positive (HCC)     HISTORY OF PRESENTING ILLNESS: Alone.  Ambulating independently.  Tonye Becket Strege 68 y.o.  female stage I breast cancer ER/PR positive Her-2 negative on letrozole/ prolia is here for follow-up.  Taking letrozole without side effects. Appetite is fairly good. Energy is good. Mild hot flashes.  Mild chronic joint pains. Does have aching in bilateral LE's. Not any worse.  No shortness of breath.   Review of Systems  Constitutional:  Negative for chills, diaphoresis, fever, malaise/fatigue and weight loss.  HENT:  Negative for nosebleeds and sore throat.   Eyes:  Negative for double vision.  Respiratory:  Negative for cough, hemoptysis, sputum production, shortness of breath and wheezing.   Cardiovascular:  Negative for chest pain,  palpitations, orthopnea and leg swelling.  Gastrointestinal:  Negative for abdominal pain, blood in stool, constipation, diarrhea, heartburn, melena, nausea and vomiting.  Genitourinary:  Negative for dysuria, frequency and urgency.  Musculoskeletal:  Positive for joint pain. Negative for back pain.  Skin: Negative.  Negative for itching and rash.  Neurological:  Negative for dizziness, tingling, focal weakness, weakness and headaches.  Endo/Heme/Allergies:  Does not bruise/bleed easily.  Psychiatric/Behavioral:  Negative for depression. The patient is not nervous/anxious and does not have insomnia.      MEDICAL HISTORY:  Past Medical History:  Diagnosis Date   Basal cell carcinoma 02/10/2022   Left lower eyelid. Nodular. Mohs 04/27/22   Breast cancer (HCC) 04/2019   right breast   Cancer (HCC)    breast cancer   Dyspnea    H/O radioactive iodine thyroid ablation    Headache    MIGRAINES   History of actinic keratosis 05/30/2018   right posterior shoulder   History of actinic keratosis 03/31/2020   right forearm   History of squamous cell carcinoma 10/19/2012   right anterior lower leg / excised 10/31/2012   Hypertension    Hypothyroidism    Personal history of radiation therapy     SURGICAL HISTORY: Past Surgical History:  Procedure Laterality Date   ABDOMINAL HYSTERECTOMY     Age 47, for bleeding and cyst; NO ovaries   BREAST BIOPSY Right 04/25/2019   positive/ done in Dr. Rutherford Nail office   BREAST LUMPECTOMY Right 05/13/2019   positive   BREAST LUMPECTOMY WITH SENTINEL LYMPH NODE BIOPSY Right 05/13/2019   Procedure: BREAST LUMPECTOMY WITH SENTINEL LYMPH NODE BX;  Surgeon: Earline Mayotte, MD;  Location:  [facility] ORS;  Service: General;  Laterality: Right;   COLONOSCOPY WITH PROPOFOL N/A 12/16/2021   Procedure: COLONOSCOPY WITH PROPOFOL;  Surgeon: Midge Minium, MD;  Location: [facility] ENDOSCOPY;  Service: Endoscopy;  Laterality: N/A;   VAGINAL DELIVERY      SOCIAL  HISTORY: Social History   Socioeconomic History   Marital status: Married    Spouse name: Not on file   Number of children: Not on file   Years of education: Not on file   Highest education level: Not on file  Occupational History   Not on file  Tobacco Use   Smoking status: Never   Smokeless tobacco: Never  Vaping Use   Vaping status: Never Used  Substance and Sexual Activity   Alcohol use: Yes    Alcohol/week: 1.0 standard drink of alcohol    Types: 1 Glasses of wine per week    Comment: rare   Drug use: Never   Sexual activity: Not on file  Other Topics Concern   Not on file  Social History Narrative   Lives in [place] with husband and son and 4 grandchildren.      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       Never smoked; social alcohol.       Social Drivers of Corporate investment banker Strain: Low Risk  (04/14/2022)   Overall Financial Resource Strain (CARDIA)    Difficulty of Paying Living Expenses: Not hard at all  Food Insecurity: No Food Insecurity (04/14/2022)   Hunger Vital Sign    Worried About Running Out of Food in the Last Year: Never true    Ran Out of Food in the Last Year: Never true  Transportation Needs: No Transportation Needs (04/14/2022)   PRAPARE - Administrator, Civil Service (Medical): No    Lack of Transportation (Non-Medical): No  Physical Activity: Sufficiently Active (04/14/2022)   Exercise Vital Sign    Days of Exercise per Week: 5 days    Minutes of Exercise per Session: 60 min  Stress: No Stress Concern Present (04/14/2022)   [employer] of Occupational Health - Occupational Stress Questionnaire    Feeling of Stress : Not at all  Social Connections: Unknown (04/14/2022)   Social Connection and Isolation Panel [NHANES]    Frequency of Communication with Friends and Family: More than three times a  week    Frequency of Social Gatherings with Friends and Family: More than three times a week    Attends Religious Services: Not on file    Active Member of Clubs or Organizations: Not on file    Attends Banker Meetings: Not on file    Marital Status: Married  Intimate Partner Violence: Not At Risk (04/14/2022)   Humiliation, Afraid, Rape, and Kick questionnaire    Fear of Current or Ex-Partner: No    Emotionally Abused: No    Physically Abused: No    Sexually Abused: No    FAMILY HISTORY: Family History  Problem Relation Age of Onset   Cancer Mother        lung, non-smoker   Dementia Father        related to traumatic brain injury   Breast cancer Neg Hx     ALLERGIES:  is allergic to zomepirac, amoxicillin-pot clavulanate, and other.  MEDICATIONS:  Current  Outpatient Medications  Medication Sig Dispense Refill   albuterol (VENTOLIN HFA) 108 (90 Base) MCG/ACT inhaler Inhale 2 puffs into the lungs every 6 (six) hours as needed for wheezing or shortness of breath. 8 g 0   amLODipine (NORVASC) 5 MG tablet TAKE 1 TABLET BY MOUTH ONCE DAILY 90 tablet 1   Ascorbic Acid (VITAMIN C PO) Take 1 tablet by mouth daily.     buPROPion (WELLBUTRIN XL) 300 MG 24 hr tablet TAKE 1 TABLET BY MOUTH DAILY 90 tablet 1   calcium carbonate (OS-CAL - DOSED IN MG OF ELEMENTAL CALCIUM) 1250 (500 Ca) MG tablet Take 1 tablet by mouth daily.     denosumab (PROLIA) 60 MG/ML SOSY injection Inject 60 mg into the skin every 6 (six) months.     fluorouracil (EFUDEX) 5 % cream Apply twice daily to right forehead, right upper lip, left temple for 7 days 40 g 1   Ivermectin (SOOLANTRA) 1 % CREA APPLY THIN LAYER TO FACE DAILY 45 g 5   letrozole (FEMARA) 2.5 MG tablet TAKE ONE TABLET BY MOUTH EVERY DAY 90 tablet 3   levothyroxine (SYNTHROID) 100 MCG tablet TAKE 1 TABLET EVERY DAY ON EMPTY STOMACHWITH A GLASS OF WATER AT LEAST 30-60 MINBEFORE BREAKFAST (Patient taking differently: Take 100 mcg by mouth  daily before breakfast.) 90 tablet 0   Multiple Vitamins-Minerals (MULTIVITAMIN ADULT PO) Take 1 tablet by mouth daily.     mupirocin ointment (BACTROBAN) 2 % Apply to skin qd-bid 22 g 1   omeprazole (PRILOSEC) 20 MG capsule Take 1 capsule (20 mg total) by mouth daily. 30 capsule 0   ondansetron (ZOFRAN) 4 MG tablet Take 1 tablet (4 mg total) by mouth every 8 (eight) hours as needed for nausea or vomiting. 30 tablet 0   rosuvastatin (CRESTOR) 5 MG tablet Take 1 tablet (5 mg total) by mouth every evening. 90 tablet 3   SUMAtriptan (IMITREX) 50 MG tablet May repeat in 2 hours if headache persists or recurs. 15 tablet 2   triamcinolone (KENALOG) 0.1 % paste Apply twice daily to ulcers in mouth. Dry area before application 5 g 2   zolpidem (AMBIEN) 5 MG tablet TAKE 1 TABLET BY MOUTH AT BEDTIME. 30 tablet 2   No current facility-administered medications for this visit.      Marland Kitchen  PHYSICAL EXAMINATION: ECOG PERFORMANCE STATUS: 0 - Asymptomatic  Vitals:   04/11/23 1303  BP: 135/70  Pulse: 66  Resp: 16  Temp: 99.4 F (37.4 C)  SpO2: 100%    Filed Weights   04/11/23 1303  Weight: 128 lb 12.8 oz (58.4 kg)     Physical Exam HENT:     Head: Normocephalic and atraumatic.     Mouth/Throat:     Pharynx: No oropharyngeal exudate.  Eyes:     Pupils: Pupils are equal, round, and reactive to light.  Cardiovascular:     Rate and Rhythm: Normal rate and regular rhythm.  Pulmonary:     Effort: Pulmonary effort is normal. No respiratory distress.     Breath sounds: Normal breath sounds. No wheezing.  Abdominal:     General: Bowel sounds are normal. There is no distension.     Palpations: Abdomen is soft. There is no mass.     Tenderness: There is no abdominal tenderness. There is no guarding or rebound.  Musculoskeletal:        General: No tenderness. Normal range of motion.     Cervical back: Normal range of motion  and neck supple.  Skin:    General: Skin is warm.  Neurological:      Mental Status: She is alert and oriented to person, place, and time.  Psychiatric:        Mood and Affect: Affect normal.      LABORATORY DATA:  I have reviewed the data as listed Lab Results  Component Value Date   WBC 4.8 04/11/2023   HGB 13.2 04/11/2023   HCT 40.3 04/11/2023   MCV 96.4 04/11/2023   PLT 217 04/11/2023   Recent Labs    04/12/22 0954 10/12/22 0943 04/11/23 1254  NA 134* 133* 135  K 4.1 3.8 4.2  CL 100 100 102  CO2 27 27 26   GLUCOSE 117* 124* 104*  BUN 9 12 10   CREATININE 0.81 0.82 0.81  CALCIUM 8.6* 9.0 8.7*  GFRNONAA >60 >60 >60  PROT  --  6.9 6.9  ALBUMIN  --  4.0 4.0  AST  --  19 21  ALT  --  18 18  ALKPHOS  --  50 49  BILITOT  --  0.6 0.5    RADIOGRAPHIC STUDIES: I have personally reviewed the radiological images as listed and agreed with the findings in the report. No results found.  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  On adjuvant Letrozole [until summer 2026]. Stable.   # Tolerating well no major side effects noted except for mild joint pains/hot flashes. Mammo-April 2024[[facility]-surgery]- Stable.  # joint pains- G-1 sec to letrozole.  Stable.  # Hot flashes- G-1 sec to letrozole. Stable.  # OSTEOPOROSIS [BMD-MARCH 2021]-T score -3; On Prolia [2021]; SEP  2023- T-score of -2.9.  Overall stable.  Continue Vit D 1000/day. Continue calcium plus vitamin D plus exercise program. Continue lifting weights/physical activity.  Will continue prolia for now.  We will repeat bone density again in sep, 2025; ordered today- consider duration of prolia. Today Ca- 8.5- recommend increasing the ca+vit D BID- stable.   # DISPOSITION: # Prolia today # Follow up in 6 months- MD; labs-cbc; cmp; Vit D 25 OH- prolia SQ; BMD-Dr.B    All questions were answered. The patient/family knows  to call the clinic with any problems, questions or concerns.    Earna Coder, MD 04/11/2023 2:16 PM

## 2023-04-26 ENCOUNTER — Other Ambulatory Visit: Payer: Self-pay

## 2023-04-26 DIAGNOSIS — C50411 Malignant neoplasm of upper-outer quadrant of right female breast: Secondary | ICD-10-CM

## 2023-05-04 ENCOUNTER — Encounter: Payer: Self-pay | Admitting: Family

## 2023-05-04 ENCOUNTER — Ambulatory Visit
Admission: RE | Admit: 2023-05-04 | Discharge: 2023-05-04 | Disposition: A | Discharge status: Home / Self Care | Source: Ambulatory Visit | Attending: Family | Admitting: Family

## 2023-05-04 ENCOUNTER — Ambulatory Visit (INDEPENDENT_AMBULATORY_CARE_PROVIDER_SITE_OTHER): Payer: PPO | Admitting: Family

## 2023-05-04 VITALS — BP 130/78 | HR 77 | Temp 97.7°F | Ht 63.0 in | Wt 127.4 lb

## 2023-05-04 DIAGNOSIS — G4700 Insomnia, unspecified: Secondary | ICD-10-CM | POA: Diagnosis not present

## 2023-05-04 DIAGNOSIS — R053 Chronic cough: Secondary | ICD-10-CM | POA: Diagnosis not present

## 2023-05-04 DIAGNOSIS — Z1231 Encounter for screening mammogram for malignant neoplasm of breast: Secondary | ICD-10-CM

## 2023-05-04 DIAGNOSIS — E039 Hypothyroidism, unspecified: Secondary | ICD-10-CM

## 2023-05-04 LAB — TSH: TSH: 1.62 u[IU]/mL (ref 0.35–5.50)

## 2023-05-04 MED ORDER — LEVOTHYROXINE SODIUM 100 MCG PO TABS: ORAL_TABLET | ORAL | Status: AC

## 2023-05-04 NOTE — Assessment & Plan Note (Signed)
Chronic, stable.  Continue Ambien 5mg  qhs

## 2023-05-04 NOTE — Progress Notes (Signed)
 Assessment & Plan:  Hypothyroidism, unspecified type -     Levothyroxine Sodium; Takes 1 tablet 6 days per week -     TSH  Insomnia, unspecified type Assessment & Plan: Chronic, stable.  Continue Ambien 5mg  qhs   Chronic cough Assessment & Plan: Postviral cough after influenza.  Advised oral histamine with addition of azelastine.  Also advised for her to resume omeprazole 20 mg daily for short period of time      Return precautions given.   Risks, benefits, and alternatives of the medications and treatment plan prescribed today were discussed, and patient expressed understanding.   Education regarding symptom management and diagnosis given to patient on AVS either electronically or printed.  No follow-ups on file.  Rennie Plowman, FNP  Subjective:    Patient ID: Margarette Asal, female    DOB: 1955-03-09, 68 y.o.   MRN: [id number]  CC: Teyla Skidgel is a 68 y.o. female who presents today for follow up.   HPI: Overall feels well today.  No new complaints.  Recently had the flu.  Cough has significantly improved.  Very occasional now.  She is taking over-the-counter antihistamine (unsure which one).   She is doing well on ambien and taking 2.5 mg to 5 mg nightly as needed     Follow up oncology  04/11/2023 history of right breast cancer.  Continued on letrozole.  Mammogram today Allergies: Zomepirac, Amoxicillin-pot clavulanate, and Other Current Outpatient Medications on File Prior to Visit  Medication Sig Dispense Refill   albuterol (VENTOLIN HFA) 108 (90 Base) MCG/ACT inhaler Inhale 2 puffs into the lungs every 6 (six) hours as needed for wheezing or shortness of breath. 8 g 0   amLODipine (NORVASC) 5 MG tablet TAKE 1 TABLET BY MOUTH ONCE DAILY 90 tablet 1   Ascorbic Acid (VITAMIN C PO) Take 1 tablet by mouth daily.     buPROPion (WELLBUTRIN XL) 300 MG 24 hr tablet TAKE 1 TABLET BY MOUTH DAILY 90 tablet 1   calcium carbonate (OS-CAL - DOSED IN MG OF  ELEMENTAL CALCIUM) 1250 (500 Ca) MG tablet Take 1 tablet by mouth daily.     denosumab (PROLIA) 60 MG/ML SOSY injection Inject 60 mg into the skin every 6 (six) months.     fluorouracil (EFUDEX) 5 % cream Apply twice daily to right forehead, right upper lip, left temple for 7 days 40 g 1   Ivermectin (SOOLANTRA) 1 % CREA APPLY THIN LAYER TO FACE DAILY 45 g 5   letrozole (FEMARA) 2.5 MG tablet TAKE ONE TABLET BY MOUTH EVERY DAY 90 tablet 3   Multiple Vitamins-Minerals (MULTIVITAMIN ADULT PO) Take 1 tablet by mouth daily.     mupirocin ointment (BACTROBAN) 2 % Apply to skin qd-bid 22 g 1   omeprazole (PRILOSEC) 20 MG capsule Take 1 capsule (20 mg total) by mouth daily. 30 capsule 0   ondansetron (ZOFRAN) 4 MG tablet Take 1 tablet (4 mg total) by mouth every 8 (eight) hours as needed for nausea or vomiting. 30 tablet 0   rosuvastatin (CRESTOR) 5 MG tablet Take 1 tablet (5 mg total) by mouth every evening. 90 tablet 3   SUMAtriptan (IMITREX) 50 MG tablet May repeat in 2 hours if headache persists or recurs. 15 tablet 2   triamcinolone (KENALOG) 0.1 % paste Apply twice daily to ulcers in mouth. Dry area before application 5 g 2   zolpidem (AMBIEN) 5 MG tablet TAKE 1 TABLET BY MOUTH AT BEDTIME. 30 tablet 2  No current facility-administered medications on file prior to visit.    Review of Systems  Constitutional:  Negative for chills and fever.  HENT:  Positive for postnasal drip.   Respiratory:  Positive for cough. Negative for shortness of breath and wheezing.   Cardiovascular:  Negative for chest pain and palpitations.  Gastrointestinal:  Negative for nausea and vomiting.      Objective:    BP 130/78   Pulse 77   Temp 97.7 F (36.5 C) (Oral)   Ht 5\' 3"  (1.6 m)   Wt 127 lb 6.4 oz (57.8 kg)   SpO2 98%   BMI 22.57 kg/m  BP Readings from Last 3 Encounters:  05/04/23 130/78  04/11/23 135/70  12/08/22 130/74   Wt Readings from Last 3 Encounters:  05/04/23 127 lb 6.4 oz (57.8 kg)   04/11/23 128 lb 12.8 oz (58.4 kg)  12/08/22 127 lb 3.2 oz (57.7 kg)    Physical Exam Vitals reviewed.  Constitutional:      Appearance: She is well-developed.  HENT:     Mouth/Throat:     Pharynx: No pharyngeal swelling, posterior oropharyngeal erythema or uvula swelling.  Eyes:     Conjunctiva/sclera: Conjunctivae normal.  Cardiovascular:     Rate and Rhythm: Normal rate and regular rhythm.     Pulses: Normal pulses.     Heart sounds: Normal heart sounds.  Pulmonary:     Effort: Pulmonary effort is normal.     Breath sounds: Normal breath sounds. No wheezing, rhonchi or rales.  Skin:    General: Skin is warm and dry.  Neurological:     Mental Status: She is alert.  Psychiatric:        Speech: Speech normal.        Behavior: Behavior normal.        Thought Content: Thought content normal.

## 2023-05-04 NOTE — Assessment & Plan Note (Signed)
 Postviral cough after influenza.  Advised oral histamine with addition of azelastine.  Also advised for her to resume omeprazole 20 mg daily for short period of time

## 2023-05-26 ENCOUNTER — Other Ambulatory Visit: Payer: Self-pay | Admitting: Family

## 2023-05-26 DIAGNOSIS — E785 Hyperlipidemia, unspecified: Secondary | ICD-10-CM

## 2023-06-20 ENCOUNTER — Telehealth: Payer: Self-pay | Admitting: *Deleted

## 2023-06-20 NOTE — Telephone Encounter (Signed)
 Jennifer Clayton from [place] needs the fax number for medical clearance .  I gave her the fax number [phone].  She will send us  a paper for the doctor to review

## 2023-06-28 ENCOUNTER — Ambulatory Visit

## 2023-06-30 ENCOUNTER — Other Ambulatory Visit: Payer: Self-pay | Admitting: Family

## 2023-07-22 ENCOUNTER — Other Ambulatory Visit: Payer: Self-pay | Admitting: Family

## 2023-07-22 DIAGNOSIS — G4700 Insomnia, unspecified: Secondary | ICD-10-CM

## 2023-07-25 NOTE — Telephone Encounter (Signed)
 Spoke to pt informed her that rx sent in to pharmacy and scheduled appt for control substance f/up

## 2023-08-09 ENCOUNTER — Encounter: Payer: Self-pay | Admitting: Family

## 2023-08-09 ENCOUNTER — Ambulatory Visit (INDEPENDENT_AMBULATORY_CARE_PROVIDER_SITE_OTHER): Admitting: Family

## 2023-08-09 VITALS — BP 128/78 | HR 83 | Temp 97.9°F | Ht 63.0 in | Wt 124.6 lb

## 2023-08-09 DIAGNOSIS — G4700 Insomnia, unspecified: Secondary | ICD-10-CM | POA: Diagnosis not present

## 2023-08-09 NOTE — Assessment & Plan Note (Signed)
 Chronic, stable. She usually takes half to full tablet of ambien  each night.  Controlled substance contract completed today. Continue ambien  5mg  at bedtime

## 2023-08-09 NOTE — Progress Notes (Signed)
 Assessment & Plan:  Insomnia, unspecified type Assessment & Plan: Chronic, stable. She usually takes half to full tablet of ambien  each night.  Controlled substance contract completed today. Continue ambien  5mg  at bedtime       Return precautions given.   Risks, benefits, and alternatives of the medications and treatment plan prescribed today were discussed, and patient expressed understanding.   Education regarding symptom management and diagnosis given to patient on AVS either electronically or printed.  Return in about 3 months (around 11/09/2023).  Rollene Northern, FNP  Subjective:    Patient ID: Jennifer Clayton, female    DOB: 1955/05/08, 68 y.o.   MRN: [id number]  CC: Jennifer Clayton is a 67 y.o. female who presents today for follow up.   HPI: She feels well today No new complaints.   Compliant with half to full tablet of ambien  qpm with relief  Suspected post viral cough 05/2023 has resolved.   Mammogram UTD  Allergies: Zomepirac, Amoxicillin-pot clavulanate, and Other Current Outpatient Medications on File Prior to Visit  Medication Sig Dispense Refill   albuterol  (VENTOLIN  HFA) 108 (90 Base) MCG/ACT inhaler Inhale 2 puffs into the lungs every 6 (six) hours as needed for wheezing or shortness of breath. 8 g 0   amLODipine  (NORVASC ) 5 MG tablet TAKE 1 TABLET BY MOUTH ONCE DAILY 90 tablet 1   Ascorbic Acid (VITAMIN C PO) Take 1 tablet by mouth daily.     buPROPion  (WELLBUTRIN  XL) 300 MG 24 hr tablet TAKE 1 TABLET BY MOUTH DAILY 90 tablet 1   calcium  carbonate (OS-CAL - DOSED IN MG OF ELEMENTAL CALCIUM ) 1250 (500 Ca) MG tablet Take 1 tablet by mouth daily.     denosumab  (PROLIA ) 60 MG/ML SOSY injection Inject 60 mg into the skin every 6 (six) months.     fluorouracil  (EFUDEX ) 5 % cream Apply twice daily to right forehead, right upper lip, left temple for 7 days 40 g 1   Ivermectin  (SOOLANTRA ) 1 % CREA APPLY THIN LAYER TO FACE DAILY 45 g 5   letrozole  (FEMARA )  2.5 MG tablet TAKE ONE TABLET BY MOUTH EVERY DAY 90 tablet 3   levothyroxine  (SYNTHROID ) 100 MCG tablet Takes 1 tablet 6 days per week     Multiple Vitamins-Minerals (MULTIVITAMIN ADULT PO) Take 1 tablet by mouth daily.     mupirocin  ointment (BACTROBAN ) 2 % Apply to skin qd-bid 22 g 1   omeprazole  (PRILOSEC) 20 MG capsule Take 1 capsule (20 mg total) by mouth daily. 30 capsule 0   ondansetron  (ZOFRAN ) 4 MG tablet Take 1 tablet (4 mg total) by mouth every 8 (eight) hours as needed for nausea or vomiting. 30 tablet 0   rosuvastatin  (CRESTOR ) 5 MG tablet TAKE 1 TABLET BY MOUTH EVERY EVENING. 90 tablet 3   SUMAtriptan  (IMITREX ) 50 MG tablet May repeat in 2 hours if headache persists or recurs. 15 tablet 2   triamcinolone  (KENALOG ) 0.1 % paste Apply twice daily to ulcers in mouth. Dry area before application 5 g 2   zolpidem  (AMBIEN ) 5 MG tablet TAKE 1 TABLET BY MOUTH AT BEDTIME 30 tablet 0   No current facility-administered medications on file prior to visit.    Review of Systems  Constitutional:  Negative for chills and fever.  Respiratory:  Negative for cough.   Cardiovascular:  Negative for chest pain and palpitations.  Gastrointestinal:  Negative for nausea and vomiting.      Objective:    BP 128/78  Pulse 83   Temp 97.9 F (36.6 C) (Oral)   Ht 5' 3 (1.6 m)   Wt 124 lb 9.6 oz (56.5 kg)   SpO2 96%   BMI 22.07 kg/m  BP Readings from Last 3 Encounters:  08/09/23 128/78  05/04/23 130/78  04/11/23 135/70   Wt Readings from Last 3 Encounters:  08/09/23 124 lb 9.6 oz (56.5 kg)  05/04/23 127 lb 6.4 oz (57.8 kg)  04/11/23 128 lb 12.8 oz (58.4 kg)    Physical Exam Vitals reviewed.  Constitutional:      Appearance: She is well-developed.  Eyes:     Conjunctiva/sclera: Conjunctivae normal.  Cardiovascular:     Rate and Rhythm: Normal rate and regular rhythm.     Pulses: Normal pulses.     Heart sounds: Normal heart sounds.  Pulmonary:     Effort: Pulmonary effort is  normal.     Breath sounds: Normal breath sounds. No wheezing, rhonchi or rales.  Skin:    General: Skin is warm and dry.  Neurological:     Mental Status: She is alert.  Psychiatric:        Speech: Speech normal.        Behavior: Behavior normal.        Thought Content: Thought content normal.

## 2023-08-14 ENCOUNTER — Ambulatory Visit: Payer: PPO | Admitting: Dermatology

## 2023-08-14 ENCOUNTER — Encounter: Payer: Self-pay | Admitting: Dermatology

## 2023-08-14 DIAGNOSIS — L814 Other melanin hyperpigmentation: Secondary | ICD-10-CM

## 2023-08-14 DIAGNOSIS — Z1283 Encounter for screening for malignant neoplasm of skin: Secondary | ICD-10-CM | POA: Diagnosis not present

## 2023-08-14 DIAGNOSIS — Z85828 Personal history of other malignant neoplasm of skin: Secondary | ICD-10-CM | POA: Diagnosis not present

## 2023-08-14 DIAGNOSIS — W908XXA Exposure to other nonionizing radiation, initial encounter: Secondary | ICD-10-CM | POA: Diagnosis not present

## 2023-08-14 DIAGNOSIS — L821 Other seborrheic keratosis: Secondary | ICD-10-CM | POA: Diagnosis not present

## 2023-08-14 DIAGNOSIS — D099 Carcinoma in situ, unspecified: Secondary | ICD-10-CM

## 2023-08-14 DIAGNOSIS — D0472 Carcinoma in situ of skin of left lower limb, including hip: Secondary | ICD-10-CM | POA: Diagnosis not present

## 2023-08-14 DIAGNOSIS — L578 Other skin changes due to chronic exposure to nonionizing radiation: Secondary | ICD-10-CM

## 2023-08-14 DIAGNOSIS — D1801 Hemangioma of skin and subcutaneous tissue: Secondary | ICD-10-CM | POA: Diagnosis not present

## 2023-08-14 DIAGNOSIS — D229 Melanocytic nevi, unspecified: Secondary | ICD-10-CM

## 2023-08-14 DIAGNOSIS — D492 Neoplasm of unspecified behavior of bone, soft tissue, and skin: Secondary | ICD-10-CM

## 2023-08-14 HISTORY — DX: Carcinoma in situ, unspecified: D09.9

## 2023-08-14 NOTE — Patient Instructions (Addendum)
 Wound Care Instructions  Cleanse wound gently with soap and water once a day then pat dry with clean gauze. Apply a thin coat of Petrolatum (petroleum jelly, "Vaseline") over the wound (unless you have an allergy to this). We recommend that you use a new, sterile tube of Vaseline. Do not pick or remove scabs. Do not remove the yellow or white "healing tissue" from the base of the wound.  Cover the wound with fresh, clean, nonstick gauze and secure with paper tape. You may use Band-Aids in place of gauze and tape if the wound is small enough, but would recommend trimming much of the tape off as there is often too much. Sometimes Band-Aids can irritate the skin.  You should call the office for your biopsy report after 1 week if you have not already been contacted.  If you experience any problems, such as abnormal amounts of bleeding, swelling, significant bruising, significant pain, or evidence of infection, please call the office immediately.  FOR ADULT SURGERY PATIENTS: If you need something for pain relief you may take 1 extra strength Tylenol (acetaminophen) AND 2 Ibuprofen (200mg  each) together every 4 hours as needed for pain. (do not take these if you are allergic to them or if you have a reason you should not take them.) Typically, you may only need pain medication for 1 to 3 days.       Recommend daily broad spectrum sunscreen SPF 30+ to sun-exposed areas, reapply every 2 hours as needed. Call for new or changing lesions.  Staying in the shade or wearing long sleeves, sun glasses (UVA+UVB protection) and wide brim hats (4-inch brim around the entire circumference of the hat) are also recommended for sun protection.    Melanoma ABCDEs  Melanoma is the most dangerous type of skin cancer, and is the leading cause of death from skin disease.  You are more likely to develop melanoma if you: Have light-colored skin, light-colored eyes, or red or blond hair Spend a lot of time in the sun Tan  regularly, either outdoors or in a tanning bed Have had blistering sunburns, especially during childhood Have a close family member who has had a melanoma Have atypical moles or large birthmarks  Early detection of melanoma is key since treatment is typically straightforward and cure rates are extremely high if we catch it early.   The first sign of melanoma is often a change in a mole or a new dark spot.  The ABCDE system is a way of remembering the signs of melanoma.  A for asymmetry:  The two halves do not match. B for border:  The edges of the growth are irregular. C for color:  A mixture of colors are present instead of an even brown color. D for diameter:  Melanomas are usually (but not always) greater than 6mm - the size of a pencil eraser. E for evolution:  The spot keeps changing in size, shape, and color.  Please check your skin once per month between visits. You can use a small mirror in front and a large mirror behind you to keep an eye on the back side or your body.   If you see any new or changing lesions before your next follow-up, please call to schedule a visit.  Please continue daily skin protection including broad spectrum sunscreen SPF 30+ to sun-exposed areas, reapplying every 2 hours as needed when you're outdoors.   Staying in the shade or wearing long sleeves, sun glasses (UVA+UVB protection) and  wide brim hats (4-inch brim around the entire circumference of the hat) are also recommended for sun protection.     Due to recent changes in healthcare laws, you may see results of your pathology and/or laboratory studies on MyChart before the doctors have had a chance to review them. We understand that in some cases there may be results that are confusing or concerning to you. Please understand that not all results are received at the same time and often the doctors may need to interpret multiple results in order to provide you with the best plan of care or course of  treatment. Therefore, we ask that you please give [place] 2 business days to thoroughly review all your results before contacting the office for clarification. Should we see a critical lab result, you will be contacted sooner.   If You Need Anything After Your Visit  If you have any questions or concerns for your doctor, please call our main line at [phone] and press option 4 to reach your doctor's medical assistant. If no one answers, please leave a voicemail as directed and we will return your call as soon as possible. Messages left after 4 pm will be answered the following business day.   You may also send [place] a message via MyChart. We typically respond to MyChart messages within 1-2 business days.  For prescription refills, please ask your pharmacy to contact our office. Our fax number is [phone].  If you have an urgent issue when the clinic is closed that cannot wait until the next business day, you can page your doctor at the number below.    Please note that while we do our best to be available for urgent issues outside of office hours, we are not available 24/7.   If you have an urgent issue and are unable to reach [place], you may choose to seek medical care at your doctor's office, retail clinic, urgent care center, or emergency room.  If you have a medical emergency, please immediately call 911 or go to the emergency department.  Pager Numbers  - Dr. Gwen Pounds: [phone]  - Dr. Roseanne Reno: [phone]  - Dr. Katrinka Blazing: [phone]   In the event of inclement weather, please call our main line at [phone] for an update on the status of any delays or closures.  Dermatology Medication Tips: Please keep the boxes that topical medications come in in order to help keep track of the instructions about where and how to use these. Pharmacies typically print the medication instructions only on the boxes and not directly on the medication tubes.   If your medication is too expensive,  please contact our office at [phone] option 4 or send [place] a message through MyChart.   We are unable to tell what your co-pay for medications will be in advance as this is different depending on your insurance coverage. However, we may be able to find a substitute medication at lower cost or fill out paperwork to get insurance to cover a needed medication.   If a prior authorization is required to get your medication covered by your insurance company, please allow [place] 1-2 business days to complete this process.  Drug prices often vary depending on where the prescription is filled and some pharmacies may offer cheaper prices.  The website www.goodrx.com contains coupons for medications through different pharmacies. The prices here do not account for what the cost may be with help from insurance (it may be cheaper with your insurance), but the website  can give you the price if you did not use any insurance.  - You can print the associated coupon and take it with your prescription to the pharmacy.  - You may also stop by our office during regular business hours and pick up a GoodRx coupon card.  - If you need your prescription sent electronically to a different pharmacy, notify our office through [facility] or by phone at [phone] option 4.     Si Usted Necesita Algo Despus de Su Visita  Tambin puede enviarnos un mensaje a travs de Clinical cytogeneticist. Por lo general respondemos a los mensajes de MyChart en el transcurso de 1 a 2 das hbiles.  Para renovar recetas, por favor pida a su farmacia que se ponga en contacto con nuestra oficina. Annie Sable de fax es [place] [phone].  Si tiene un asunto urgente cuando la clnica est cerrada y que no puede esperar hasta el siguiente da hbil, puede llamar/localizar a su doctor(a) al nmero que aparece a continuacin.   Por favor, tenga en cuenta que aunque hacemos todo lo posible para estar disponibles para asuntos urgentes fuera del  horario de [employer], no estamos disponibles las 24 horas del da, los 7 [address]  [address] de la [place].   Si tiene un problema urgente y no puede comunicarse con nosotros, puede optar por buscar atencin mdica  en el consultorio de su doctor(a), en una clnica privada, en un centro de atencin urgente o en una sala de emergencias.  Si tiene Engineer, drilling, por favor llame inmediatamente al 911 o vaya a la sala de emergencias.  Nmeros de bper  - Dr. Gwen Pounds: [phone]  - Dra. Roseanne Reno: [phone]  - Dr. Katrinka Blazing: [phone]   En caso de inclemencias del tiempo, por favor llame a Lacy Duverney principal al [phone] para una actualizacin sobre el [place] de cualquier retraso o cierre.  Consejos para la medicacin en dermatologa: Por favor, guarde las cajas en las que vienen los medicamentos de uso tpico para ayudarle a seguir las instrucciones sobre dnde y cmo usarlos. Las farmacias generalmente imprimen las instrucciones del medicamento slo en las cajas y no directamente en los tubos del [place].   Si su medicamento es muy caro, por favor, pngase en contacto con Rolm Gala llamando al [phone] y presione la opcin 4 o envenos un mensaje a travs de Clinical cytogeneticist.   No podemos decirle cul ser su copago por los medicamentos por adelantado ya que esto es diferente dependiendo de la cobertura de su seguro. Sin embargo, es posible que podamos encontrar un medicamento sustituto a Audiological scientist un formulario para que el seguro cubra el medicamento que se considera necesario.   Si se requiere una autorizacin previa para que su compaa de seguros [place] su medicamento, por favor permtanos de 1 a 2 das hbiles para completar [address]  Los precios de los medicamentos varan con frecuencia dependiendo del Environmental consultant de dnde se surte la receta y alguna farmacias pueden ofrecer precios ms baratos.  El sitio web www.goodrx.com tiene cupones para medicamentos de Engineer, civil (consulting). Los precios aqu no tienen en cuenta lo que podra costar con la ayuda del seguro (puede ser ms barato con su seguro), pero el sitio web puede darle el precio si no utiliz Tourist information centre manager.  - Puede imprimir el cupn correspondiente y llevarlo con su receta a la farmacia.  - Tambin puede pasar por nuestra oficina durante el horario de atencin regular y Education officer, museum una tarjeta de cupones de GoodRx.  -  Si necesita que su receta se enve electrnicamente a Psychiatrist, informe a nuestra oficina a travs de MyChart de [place] o por telfono llamando al [phone] y presione la opcin 4.

## 2023-08-14 NOTE — Progress Notes (Signed)
 Follow-Up Visit   Subjective  Jennifer Clayton is a 68 y.o. female who presents for the following: Skin Cancer Screening and Full Body Skin Exam. Hx of SCC and BCC. Patient does have place at L lower leg below the knee, noticed it about a month ago, not itchy or sore.   The patient presents for Total-Body Skin Exam (TBSE) for skin cancer screening and mole check. The patient has spots, moles and lesions to be evaluated, some may be new or changing and the patient may have concern these could be cancer.   The following portions of the chart were reviewed this encounter and updated as appropriate: medications, allergies, medical history  Review of Systems:  No other skin or systemic complaints except as noted in HPI or Assessment and Plan.  Objective  Well appearing patient in no apparent distress; mood and affect are within normal limits.  A full examination was performed including scalp, head, eyes, ears, nose, lips, neck, chest, axillae, abdomen, back, buttocks, bilateral upper extremities, bilateral lower extremities, hands, feet, fingers, toes, fingernails, and toenails. All findings within normal limits unless otherwise noted below.   Relevant physical exam findings are noted in the Assessment and Plan.  Left lower anterior leg 9 mm pink scaly papule    Assessment & Plan   SKIN CANCER SCREENING PERFORMED TODAY.  ACTINIC DAMAGE - Chronic condition, secondary to cumulative UV/sun exposure - diffuse scaly erythematous macules with underlying dyspigmentation - Recommend daily broad spectrum sunscreen SPF 30+ to sun-exposed areas, reapply every 2 hours as needed.  - Staying in the shade or wearing long sleeves, sun glasses (UVA+UVB protection) and wide brim hats (4-inch brim around the entire circumference of the hat) are also recommended for sun protection.  - Call for new or changing lesions.  LENTIGINES, SEBORRHEIC KERATOSES, HEMANGIOMAS - Benign normal skin lesions -  Benign-appearing - Call for any changes  MELANOCYTIC NEVI - Tan-brown and/or pink-flesh-colored symmetric macules and papules - Benign appearing on exam today - Observation - Call clinic for new or changing moles - Recommend daily use of broad spectrum spf 30+ sunscreen to sun-exposed areas.   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  - Call if any new or changing lesions are noted between office visits   HISTORY OF BASAL CELL CARCINOMA OF THE SKIN Left lower eyelid nodular- Mohs 04/27/2022 - No evidence of recurrence today - Recommend regular full body skin exams - Recommend daily broad spectrum sunscreen SPF 30+ to sun-exposed areas, reapply every 2 hours as needed.  - Call if any new or changing lesions are noted between office visits  NEOPLASM OF SKIN Left lower anterior leg Skin / nail biopsy Type of biopsy: tangential   Informed consent: discussed and consent obtained   Timeout: patient name, date of birth, surgical site, and procedure verified   Procedure prep:  Patient was prepped and draped in usual sterile fashion Prep type:  Isopropyl alcohol Anesthesia: the lesion was anesthetized in a standard fashion   Anesthetic:  1% lidocaine  w/ epinephrine  1-100,000 buffered w/ 8.4% NaHCO3 Instrument used: DermaBlade   Hemostasis achieved with: pressure and aluminum chloride   Outcome: patient tolerated procedure well   Post-procedure details: sterile dressing applied and wound care instructions given  Dressing type: bandage and petrolatum    Specimen 1 - Surgical pathology Differential Diagnosis: R/o SCC  Check Margins: No 9 mm pink scaly papule Related Medications mupirocin  ointment (BACTROBAN ) 2 % Apply to skin  qd-bid MULTIPLE BENIGN NEVI   LENTIGINES   ACTINIC ELASTOSIS   SEBORRHEIC KERATOSES   CHERRY ANGIOMA   Return in about 6 months (around 02/14/2024) for w/ Dr. Claudene, HxSCC, HxBCC, TBSE.  I, Jacquelynn V. Wilfred, CMA, am acting as scribe for Boneta Claudene, MD .   Documentation: I have reviewed the above documentation for accuracy and completeness, and I agree with the above.  Boneta Claudene, MD

## 2023-08-16 LAB — SURGICAL PATHOLOGY

## 2023-08-17 ENCOUNTER — Encounter: Payer: Self-pay | Admitting: Dermatology

## 2023-08-17 ENCOUNTER — Ambulatory Visit: Payer: Self-pay | Admitting: Dermatology

## 2023-08-17 MED ORDER — FLUOROURACIL 5 % EX CREA: TOPICAL_CREAM | CUTANEOUS | 2 refills | Status: AC

## 2023-08-17 NOTE — Telephone Encounter (Signed)
-----   Message from [place] sent at 08/17/2023  5:11 PM EDT ----- Diagnosis left lower anterior leg :       SQUAMOUS CELL CARCINOMA IN SITU   Please call with diagnosis and message me with patient's decision on treatment.   Explanation: Biopsy shows a squamous cell skin cancer limited to the top layer of skin. This means it is an early cancer and has not spread. However, it has the potential to spread beyond the skin  and threaten your health, so we recommend treating it.   Treatment option 1: a cream (fluorouracil  and calcipotriene) that helps your immune system clear the skin cancer. It will cause redness and irritation. Wait two weeks after the biopsy to start  applying the cream. Apply the cream twice per day until the redness and irritation develop (usually occurs by day 7), then stop and allow it to heal. We will recheck the area in 2 months to ensure  the cancer is gone. The cream is $45 plus shipping and will be mailed to you from a low cost compounding pharmacy.  Treatment option 2: you return for a brief appointment where I perform electrodesiccation and curettage [facility]). This involves three rounds of scraping and burning to destroy the skin cancer. It has  about an 85% cure rate and leaves a round wound slightly larger than the skin cancer and leaves a round white scar. No additional pathology is done. If the skin cancer comes back, we would need to do  a surgery to remove it.  Treatment option 3: Mohs surgery, which involves cutting out right around the skin cancer and then checking under the microscope on the same day to ensure the whole skin cancer is out. If there is  more cancer remaining, the surgeon will repeat the process until it is fully cleared. The cure rate is about 98-99%. It is done at another office outside of [place] ([place], [place], or  [place]). Once the Mohs surgeon confirms the skin cancer is out, they will discuss the options to repair or heal  the area. You must take it easy for about two weeks after surgery (no lifting over  10-15 lbs, avoid activity to get your heart rate and blood pressure up). ----- Message ----- From: Interface, Lab In [facility] [place] Sent: 08/16/2023   6:19 PM EDT To: Boneta Sharps, MD

## 2023-08-17 NOTE — Telephone Encounter (Signed)
 Advised pt of bx results and discussed treatment options.  Patient prefers 5FU/Calcipotriene.  Sent to [facility]	[place].  Scheduled pt for 109m f/u/sh

## 2023-08-23 ENCOUNTER — Ambulatory Visit: Admitting: Dermatology

## 2023-08-23 ENCOUNTER — Encounter: Payer: Self-pay | Admitting: Dermatology

## 2023-08-23 DIAGNOSIS — S40861A Insect bite (nonvenomous) of right upper arm, initial encounter: Secondary | ICD-10-CM | POA: Diagnosis not present

## 2023-08-23 DIAGNOSIS — W57XXXA Bitten or stung by nonvenomous insect and other nonvenomous arthropods, initial encounter: Secondary | ICD-10-CM

## 2023-08-23 MED ORDER — CLOBETASOL PROPIONATE 0.05 % EX CREA: 1.0000 | TOPICAL_CREAM | Freq: Two times a day (BID) | CUTANEOUS | 0 refills | Status: AC

## 2023-08-23 NOTE — Patient Instructions (Signed)

## 2023-08-23 NOTE — Progress Notes (Signed)
   Follow-Up Visit   Subjective  Jennifer Clayton is a 68 y.o. female who presents for the following: insect bites at right arm, patient noticed on Saturday and has been putting Benadryl  cream on. Patient wants to make sure it is not getting infected.   The following portions of the chart were reviewed this encounter and updated as appropriate: medications, allergies, medical history  Review of Systems:  No other skin or systemic complaints except as noted in HPI or Assessment and Plan.  Objective  Well appearing patient in no apparent distress; mood and affect are within normal limits.   A focused examination was performed of the following areas: Right arm  Relevant exam findings are noted in the Assessment and Plan.    Assessment & Plan    INSECT BITE REACTION Exam: pink edematous papules and plaques on R arm  Treatment Plan: Start clobetasol  0.05% cr twice daily until smooth. Avoid applying to face, groin, and axilla. Use as directed. Long-term use can cause thinning of the skin.  Topical steroids (such as triamcinolone , fluocinolone, fluocinonide, mometasone, clobetasol , halobetasol, betamethasone, hydrocortisone ) can cause thinning and lightening of the skin if they are used for too long in the same area. Your physician has selected the right strength medicine for your problem and area affected on the body. Please use your medication only as directed by your physician to prevent side effects.    ARTHROPOD BITE, INITIAL ENCOUNTER    Return for as scheduled.  LILLETTE Lonell Drones, RMA, am acting as scribe for Boneta Sharps, MD .   Documentation: I have reviewed the above documentation for accuracy and completeness, and I agree with the above.  Boneta Sharps, MD

## 2023-09-09 ENCOUNTER — Other Ambulatory Visit: Payer: Self-pay | Admitting: Family

## 2023-09-09 DIAGNOSIS — G4700 Insomnia, unspecified: Secondary | ICD-10-CM

## 2023-09-20 ENCOUNTER — Ambulatory Visit (INDEPENDENT_AMBULATORY_CARE_PROVIDER_SITE_OTHER): Admitting: *Deleted

## 2023-09-20 VITALS — Ht 63.0 in | Wt 130.0 lb

## 2023-09-20 DIAGNOSIS — Z0000 Encounter for general adult medical examination without abnormal findings: Secondary | ICD-10-CM

## 2023-09-20 NOTE — Patient Instructions (Signed)
 Jennifer Clayton , Thank you for taking time out of your busy schedule to complete your Annual Wellness Visit with me. I enjoyed our conversation and look forward to speaking with you again next year. I, as well as your care team,  appreciate your ongoing commitment to your health goals. Please review the following plan we discussed and let me know if I can assist you in the future. Your Game plan/ To Do List    Referrals: If you haven't heard from the office you've been referred to, please reach out to them at the phone provided.  Remember to get your annual flu vaccine.    Follow up Visits: We will see or speak with you next year for your Next Medicare AWV with our clinical staff 09/24/24 @ 3:00 Have you seen your provider in the last 6 months (3 months if uncontrolled diabetes)? No   Clinician Recommendations:  Aim for 30 minutes of exercise or brisk walking, 6-8 glasses of water, and 5 servings of fruits and vegetables each day.       This is a list of the screenings recommended for you:  Health Maintenance  Topic Date Due   COVID-19 Vaccine (3 - Pfizer risk series) 05/20/2019   Flu Shot  09/01/2023   Medicare Annual Wellness Visit  09/19/2024   Mammogram  05/03/2025   Colon Cancer Screening  12/17/2026   DTaP/Tdap/Td vaccine (2 - Td or Tdap) 06/24/2027   Pneumococcal Vaccine for age over 69  Completed   DEXA scan (bone density measurement)  Completed   Hepatitis C Screening  Completed   Zoster (Shingles) Vaccine  Completed   HPV Vaccine  Aged Out   Meningitis B Vaccine  Aged Out    Advanced directives: (Copy Requested) Please bring a copy of your health care power of attorney and living will to the office to be added to your chart at your convenience. You can mail to [facility] [address] [place], [place] [address] or email to [email or web address] Advance Care Planning is important because it:  [x]  Makes sure you receive the medical care that is consistent  with your values, goals, and preferences  [x]  It provides guidance to your family and loved ones and reduces their decisional burden about whether or not they are making the right decisions based on your wishes.

## 2023-09-20 NOTE — Progress Notes (Signed)
 Subjective:   Jennifer Clayton is a 68 y.o. who presents for a Medicare Wellness preventive visit.  As a reminder, Annual Wellness Visits don't include a physical exam, and some assessments may be limited, especially if this visit is performed virtually. We may recommend an in-person follow-up visit with your provider if needed.  Visit Complete: Virtual I connected with  Jennifer Clayton on 09/20/23 by a audio enabled telemedicine application and verified that I am speaking with the correct person using two identifiers.  Patient Location: Home  Provider Location: Home Office  I discussed the limitations of evaluation and management by telemedicine. The patient expressed understanding and agreed to proceed.  Vital Signs: Because this visit was a virtual/telehealth visit, some criteria may be missing or patient reported. Any vitals not documented were not able to be obtained and vitals that have been documented are patient reported.  VideoDeclined- This patient declined Librarian, academic. Therefore the visit was completed with audio only.  Persons Participating in Visit: Patient.  AWV Questionnaire: No: Patient Medicare AWV questionnaire was not completed prior to this visit.  Cardiac Risk Factors include: advanced age (>50men, >13 women);dyslipidemia;hypertension     Objective:    Today's Vitals   09/20/23 1418  Weight: 130 lb (59 kg)  Height: 5' 3 (1.6 m)   Body mass index is 23.03 kg/m.     09/20/2023    2:33 PM 04/14/2022    1:11 PM 04/12/2022   10:31 AM 12/16/2021    7:04 AM 12/06/2021    9:42 PM 10/12/2021   10:35 AM 10/11/2021   10:55 AM  Advanced Directives  Does Patient Have a Medical Advance Directive? Yes Yes Yes Yes No Yes Yes  Type of Estate agent of [place];Living will Healthcare Power of [place];Living will Healthcare Power of [place];Living will   Healthcare Power of [place];Living will Healthcare Power  of [place];Living will  Does patient want to make changes to medical advance directive?  No - Patient declined     No - Patient declined  Copy of Healthcare Power of Attorney in Chart? No - copy requested No - copy requested No - copy requested    No - copy requested    Current Medications (verified) Outpatient Encounter Medications as of 09/20/2023  Medication Sig   amLODipine  (NORVASC ) 5 MG tablet TAKE 1 TABLET BY MOUTH ONCE DAILY   Ascorbic Acid (VITAMIN C PO) Take 1 tablet by mouth daily.   buPROPion  (WELLBUTRIN  XL) 300 MG 24 hr tablet TAKE 1 TABLET BY MOUTH DAILY   calcium  carbonate (OS-CAL - DOSED IN MG OF ELEMENTAL CALCIUM ) 1250 (500 Ca) MG tablet Take 1 tablet by mouth daily.   clobetasol  cream (TEMOVATE ) 0.05 % Apply 1 Application topically 2 (two) times daily. Avoid applying to face, groin, and axilla. Use as directed. Long-term use can cause thinning of the skin. (Patient taking differently: Apply 1 Application topically 2 (two) times daily as needed. Avoid applying to face, groin, and axilla. Use as directed. Long-term use can cause thinning of the skin.)   denosumab  (PROLIA ) 60 MG/ML SOSY injection Inject 60 mg into the skin every 6 (six) months.   fluorouracil  (EFUDEX ) 5 % cream Apply twice daily to right forehead, right upper lip, left temple for 7 days   fluorouracil  (EFUDEX ) 5 % cream Wait two weeks after the biopsy to start applying the cream. Apply the cream to left lower anterior leg twice per day until the redness and irritation  develop (usually occurs by day 7), then stop and allow it to heal   Ivermectin  (SOOLANTRA ) 1 % CREA APPLY THIN LAYER TO FACE DAILY   letrozole  (FEMARA ) 2.5 MG tablet TAKE ONE TABLET BY MOUTH EVERY DAY   levothyroxine  (SYNTHROID ) 100 MCG tablet Takes 1 tablet 6 days per week   Multiple Vitamins-Minerals (MULTIVITAMIN ADULT PO) Take 1 tablet by mouth daily.   mupirocin  ointment (BACTROBAN ) 2 % Apply to skin qd-bid (Patient taking differently: Apply to  skin qd-bid as needed)   omeprazole  (PRILOSEC) 20 MG capsule Take 1 capsule (20 mg total) by mouth daily. (Patient taking differently: Take 20 mg by mouth daily as needed.)   ondansetron  (ZOFRAN ) 4 MG tablet Take 1 tablet (4 mg total) by mouth every 8 (eight) hours as needed for nausea or vomiting.   rosuvastatin  (CRESTOR ) 5 MG tablet TAKE 1 TABLET BY MOUTH EVERY EVENING.   SUMAtriptan  (IMITREX ) 50 MG tablet May repeat in 2 hours if headache persists or recurs.   triamcinolone  (KENALOG ) 0.1 % paste Apply twice daily to ulcers in mouth. Dry area before application (Patient taking differently: Apply twice daily to ulcers in mouth. Dry area before application as needed)   zolpidem  (AMBIEN ) 5 MG tablet TAKE 1 TABLET BY MOUTH AT BEDTIME   albuterol  (VENTOLIN  HFA) 108 (90 Base) MCG/ACT inhaler Inhale 2 puffs into the lungs every 6 (six) hours as needed for wheezing or shortness of breath. (Patient not taking: Reported on 09/20/2023)   No facility-administered encounter medications on file as of 09/20/2023.    Allergies (verified) Zomepirac, Amoxicillin-pot clavulanate, and Other   History: Past Medical History:  Diagnosis Date   Basal cell carcinoma 02/10/2022   Left lower eyelid. Nodular. 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 Fredirick office   BREAST LUMPECTOMY Right 05/13/2019   positive   BREAST LUMPECTOMY WITH SENTINEL LYMPH NODE BIOPSY Right 05/13/2019   Procedure: BREAST LUMPECTOMY WITH SENTINEL LYMPH NODE BX;  Surgeon: Dessa Reyes ORN, MD;  Location: [facility] ORS;  Service: General;  Laterality: Right;   COLONOSCOPY WITH PROPOFOL  N/A 12/16/2021   Procedure: COLONOSCOPY WITH PROPOFOL ;  Surgeon: Jinny Carmine, MD;  Location: [facility] ENDOSCOPY;  Service: Endoscopy;  Laterality: N/A;   VAGINAL DELIVERY     Family History  Problem Relation Age of Onset   Cancer Mother        lung, non-smoker   Dementia Father        related to traumatic brain injury   Breast cancer Neg Hx    Social History   Socioeconomic History   Marital status: Married    Spouse name: Not on file   Number of  children: Not on file   Years of education: Not on file   Highest education level: Not on file  Occupational History   Not on file  Tobacco Use   Smoking status: Never   Smokeless tobacco: Never  Vaping Use   Vaping status: Never Used  Substance and Sexual Activity   Alcohol use: Yes    Alcohol/week: 1.0 standard drink of alcohol    Types: 1 Glasses of wine per week    Comment: rare   Drug use: Never   Sexual activity: Not on file  Other Topics Concern   Not on file  Social History Narrative   Lives in [place] with husband and son and 4 grandchildren.      Work - Full time in [employer] - regular diet      Exercise - typically daily at [facility] for 45-90min, lifting some weight, ellipitical, walks.       Never smoked; social alcohol.       Social Drivers of Corporate investment banker Strain: Low Risk  (09/20/2023)   Overall Financial Resource Strain (CARDIA)    Difficulty of Paying Living Expenses: Not hard at all  Food Insecurity: No Food Insecurity (09/20/2023)   Hunger Vital Sign    Worried About Running Out of Food in the Last Year: Never true     Ran Out of Food in the Last Year: Never true  Transportation Needs: No Transportation Needs (09/20/2023)   PRAPARE - Administrator, Civil Service (Medical): No    Lack of Transportation (Non-Medical): No  Physical Activity: Sufficiently Active (09/20/2023)   Exercise Vital Sign    Days of Exercise per Week: 6 days    Minutes of Exercise per Session: 60 min  Stress: No Stress Concern Present (09/20/2023)   [employer] of Occupational Health - Occupational Stress Questionnaire    Feeling of Stress: Not at all  Social Connections: Moderately Isolated (09/20/2023)   Social Connection and Isolation Panel    Frequency of Communication with Friends and Family: More than three times a week    Frequency of Social Gatherings with Friends and Family: More than three times a week    Attends Religious Services: Never    Database administrator or Organizations: No    Attends Engineer, structural: Never    Marital Status: Married    Tobacco Counseling Counseling given: Not Answered    Clinical Intake:  Pre-visit preparation completed: Yes  Pain : No/denies pain     BMI - recorded: 23.03 Nutritional Status: BMI of 19-24  Normal Nutritional Risks: None Diabetes: No  Lab Results  Component Value Date   HGBA1C 5.8 09/10/2020   HGBA1C 5.9 12/03/2019   HGBA1C 5.8 03/07/2018     How often do you need to have someone help you when you read instructions, pamphlets, or other written materials from your doctor or pharmacy?: 1 - Never  Interpreter Needed?: No  Information entered by :: R. Victorya Hillman LPN   Activities of Daily Living     09/20/2023    2:20 PM  In your present state of health, do you have any difficulty performing the following activities:  Hearing? 0  Vision? 0  Difficulty concentrating or making decisions? 0  Walking or climbing stairs? 0  Dressing or bathing? 0  Doing errands, shopping? 0  Preparing Food and eating ? N  Using the Toilet? N  In  the past  six months, have you accidently leaked urine? N  Do you have problems with loss of bowel control? N  Managing your Medications? N  Managing your Finances? N  Housekeeping or managing your Housekeeping? N    Patient Care Team: Dineen Rollene MATSU, FNP as PCP - General (Family Medicine) Dannielle Arlean FALCON, RN (Inactive) as Oncology Nurse Navigator Lenn Aran, MD as Radiation Oncologist (Radiation Oncology) Dessa Reyes ORN, MD as Consulting Physician (General Surgery) Rennie Cindy SAUNDERS, MD as Consulting Physician (Internal Medicine)  I have updated your Care Teams any recent Medical Services you may have received from other providers in the past year.     Assessment:   This is a routine wellness examination for Jennifer Clayton.  Hearing/Vision screen Hearing Screening - Comments:: No issues Vision Screening - Comments:: glasses   Goals Addressed             This Visit's Progress    Patient Stated       Wants to work on a better diet       Depression Screen     09/20/2023    2:29 PM 08/09/2023   11:58 AM 05/04/2023    1:24 PM 12/08/2022    1:28 PM 07/29/2022    2:17 PM 05/25/2022    9:31 AM 04/14/2022    1:13 PM  PHQ 2/9 Scores  PHQ - 2 Score 0 0 0 0 0 0 0  PHQ- 9 Score 0  0   3     Fall Risk     09/20/2023    2:22 PM 08/09/2023   11:58 AM 12/08/2022    1:28 PM 07/29/2022    2:17 PM 05/25/2022    9:31 AM  Fall Risk   Falls in the past year? 0 0 0 0 0  Number falls in past yr: 0 0 0 0 0  Injury with Fall? 0 0 0 0 0  Risk for fall due to : No Fall Risks No Fall Risks No Fall Risks No Fall Risks No Fall Risks  Follow up Falls evaluation completed;Falls prevention discussed Falls evaluation completed Falls evaluation completed Falls evaluation completed Falls evaluation completed    MEDICARE RISK AT HOME:  Medicare Risk at Home Any stairs in or around the home?: Yes If so, are there any without handrails?: No Home free of loose throw rugs in walkways, pet beds,  electrical cords, etc?: Yes Adequate lighting in your home to reduce risk of falls?: Yes Life alert?: No Use of a cane, walker or w/c?: No Grab bars in the bathroom?: Yes Shower chair or bench in shower?: Yes Elevated toilet seat or a handicapped toilet?: Yes  TIMED UP AND GO:  Was the test performed?  No  Cognitive Function: 6CIT completed        09/20/2023    2:34 PM 04/14/2022    1:13 PM  6CIT Screen  What Year? 0 points 0 points  What month? 0 points 0 points  What time? 0 points 0 points  Count back from 20 0 points 0 points  Months in reverse 0 points 0 points  Repeat phrase 0 points 0 points  Total Score 0 points 0 points    Immunizations Immunization History  Administered Date(s) Administered   Fluad Quad(high Dose 65+) 11/27/2019, 12/01/2021   Influenza,inj,Quad PF,6+ Mos 10/21/2015   Influenza-Unspecified 12/02/2018, 11/12/2020   PFIZER(Purple Top)SARS-COV-2 Vaccination 04/01/2019, 04/22/2019   PNEUMOCOCCAL CONJUGATE-20 05/27/2020   Tdap 06/23/2017   Zoster Recombinant(Shingrix) 12/20/2017, 03/22/2018  Screening Tests Health Maintenance  Topic Date Due   COVID-19 Vaccine (3 - Pfizer risk series) 05/20/2019   Medicare Annual Wellness (AWV)  04/14/2023   INFLUENZA VACCINE  09/01/2023   MAMMOGRAM  05/03/2025   Colonoscopy  12/17/2026   DTaP/Tdap/Td (2 - Td or Tdap) 06/24/2027   Pneumococcal Vaccine: 50+ Years  Completed   DEXA SCAN  Completed   Hepatitis C Screening  Completed   Zoster Vaccines- Shingrix  Completed   HPV VACCINES  Aged Out   Meningococcal B Vaccine  Aged Out    Health Maintenance  Health Maintenance Due  Topic Date Due   COVID-19 Vaccine (3 - Pfizer risk series) 05/20/2019   Medicare Annual Wellness (AWV)  04/14/2023   INFLUENZA VACCINE  09/01/2023   Health Maintenance Items Addressed: Discussed the need to update flu vaccine. Patient declines covid vaccine. Dexa scheduled for 10/09/23.   Additional Screening:  Vision  Screening: Recommended annual ophthalmology exams for early detection of glaucoma and other disorders of the eye.  Up to date Dr. Laurice Would you like a referral to an eye doctor? No    Dental Screening: Recommended annual dental exams for proper oral hygiene  Community Resource Referral / Chronic Care Management: CRR required this visit?  No   CCM required this visit?  No   Plan:    I have personally reviewed and noted the following in the patient's chart:   Medical and social history Use of alcohol, tobacco or illicit drugs  Current medications and supplements including opioid prescriptions. Patient is not currently taking opioid prescriptions. Functional ability and status Nutritional status Physical activity Advanced directives List of other physicians Hospitalizations, surgeries, and ER visits in previous 12 months Vitals Screenings to include cognitive, depression, and falls Referrals and appointments  In addition, I have reviewed and discussed with patient certain preventive protocols, quality metrics, and best practice recommendations. A written personalized care plan for preventive services as well as general preventive health recommendations were provided to patient.   Angeline Fredericks, LPN   [id number]/7974   After Visit Summary: (MyChart) Due to this being a telephonic visit, the after visit summary with patients personalized plan was offered to patient via MyChart   Notes: Nothing significant to report at this time.

## 2023-10-09 ENCOUNTER — Ambulatory Visit
Admission: RE | Admit: 2023-10-09 | Discharge: 2023-10-09 | Disposition: A | Discharge status: Home / Self Care | Source: Ambulatory Visit | Attending: Internal Medicine | Admitting: Internal Medicine

## 2023-10-09 DIAGNOSIS — Z13820 Encounter for screening for osteoporosis: Secondary | ICD-10-CM | POA: Insufficient documentation

## 2023-10-09 DIAGNOSIS — Z853 Personal history of malignant neoplasm of breast: Secondary | ICD-10-CM | POA: Diagnosis not present

## 2023-10-09 DIAGNOSIS — C50411 Malignant neoplasm of upper-outer quadrant of right female breast: Secondary | ICD-10-CM | POA: Diagnosis present

## 2023-10-09 DIAGNOSIS — Z170 Estrogen receptor positive status [ER+]: Secondary | ICD-10-CM | POA: Diagnosis present

## 2023-10-09 DIAGNOSIS — M810 Age-related osteoporosis without current pathological fracture: Secondary | ICD-10-CM | POA: Insufficient documentation

## 2023-10-09 DIAGNOSIS — Z780 Asymptomatic menopausal state: Secondary | ICD-10-CM | POA: Diagnosis not present

## 2023-10-10 ENCOUNTER — Other Ambulatory Visit: Payer: Self-pay | Admitting: Family

## 2023-10-10 DIAGNOSIS — R635 Abnormal weight gain: Secondary | ICD-10-CM

## 2023-10-12 ENCOUNTER — Encounter: Payer: Self-pay | Admitting: Internal Medicine

## 2023-10-12 ENCOUNTER — Inpatient Hospital Stay: Attending: Internal Medicine

## 2023-10-12 ENCOUNTER — Inpatient Hospital Stay

## 2023-10-12 ENCOUNTER — Inpatient Hospital Stay: Admitting: Internal Medicine

## 2023-10-12 VITALS — BP 122/78 | HR 68 | Temp 98.0°F | Resp 16 | Ht 63.0 in | Wt 122.8 lb

## 2023-10-12 DIAGNOSIS — Z79899 Other long term (current) drug therapy: Secondary | ICD-10-CM | POA: Insufficient documentation

## 2023-10-12 DIAGNOSIS — Z170 Estrogen receptor positive status [ER+]: Secondary | ICD-10-CM | POA: Insufficient documentation

## 2023-10-12 DIAGNOSIS — Z803 Family history of malignant neoplasm of breast: Secondary | ICD-10-CM | POA: Diagnosis not present

## 2023-10-12 DIAGNOSIS — E039 Hypothyroidism, unspecified: Secondary | ICD-10-CM | POA: Insufficient documentation

## 2023-10-12 DIAGNOSIS — C50411 Malignant neoplasm of upper-outer quadrant of right female breast: Secondary | ICD-10-CM | POA: Insufficient documentation

## 2023-10-12 DIAGNOSIS — M2550 Pain in unspecified joint: Secondary | ICD-10-CM | POA: Diagnosis not present

## 2023-10-12 DIAGNOSIS — G8929 Other chronic pain: Secondary | ICD-10-CM | POA: Insufficient documentation

## 2023-10-12 DIAGNOSIS — R232 Flushing: Secondary | ICD-10-CM | POA: Diagnosis not present

## 2023-10-12 DIAGNOSIS — Z801 Family history of malignant neoplasm of trachea, bronchus and lung: Secondary | ICD-10-CM | POA: Insufficient documentation

## 2023-10-12 DIAGNOSIS — Z79811 Long term (current) use of aromatase inhibitors: Secondary | ICD-10-CM | POA: Diagnosis not present

## 2023-10-12 DIAGNOSIS — M810 Age-related osteoporosis without current pathological fracture: Secondary | ICD-10-CM | POA: Insufficient documentation

## 2023-10-12 DIAGNOSIS — Z85828 Personal history of other malignant neoplasm of skin: Secondary | ICD-10-CM | POA: Diagnosis not present

## 2023-10-12 LAB — CBC WITH DIFFERENTIAL (CANCER CENTER ONLY)
Abs Immature Granulocytes: 0.02 K/uL (ref 0.00–0.07)
Basophils Absolute: 0 K/uL (ref 0.0–0.1)
Basophils Relative: 1 %
Eosinophils Absolute: 0.2 K/uL (ref 0.0–0.5)
Eosinophils Relative: 4 %
HCT: 39.4 % (ref 36.0–46.0)
Hemoglobin: 13 g/dL (ref 12.0–15.0)
Immature Granulocytes: 0 %
Lymphocytes Relative: 29 %
Lymphs Abs: 1.3 K/uL (ref 0.7–4.0)
MCH: 31.6 pg (ref 26.0–34.0)
MCHC: 33 g/dL (ref 30.0–36.0)
MCV: 95.9 fL (ref 80.0–100.0)
Monocytes Absolute: 0.6 K/uL (ref 0.1–1.0)
Monocytes Relative: 12 %
Neutro Abs: 2.5 K/uL (ref 1.7–7.7)
Neutrophils Relative %: 54 %
Platelet Count: 243 K/uL (ref 150–400)
RBC: 4.11 MIL/uL (ref 3.87–5.11)
RDW: 12.4 % (ref 11.5–15.5)
WBC Count: 4.7 K/uL (ref 4.0–10.5)
nRBC: 0 % (ref 0.0–0.2)

## 2023-10-12 LAB — CMP (CANCER CENTER ONLY)
ALT: 15 U/L (ref 0–44)
AST: 17 U/L (ref 15–41)
Albumin: 3.9 g/dL (ref 3.5–5.0)
Alkaline Phosphatase: 47 U/L (ref 38–126)
Anion gap: 7 (ref 5–15)
BUN: 17 mg/dL (ref 8–23)
CO2: 27 mmol/L (ref 22–32)
Calcium: 9.1 mg/dL (ref 8.9–10.3)
Chloride: 102 mmol/L (ref 98–111)
Creatinine: 0.8 mg/dL (ref 0.44–1.00)
GFR, Estimated: >60 mL/min (ref >60)
Glucose, Bld: 82 mg/dL (ref 70–99)
Potassium: 4.5 mmol/L (ref 3.5–5.1)
Sodium: 136 mmol/L (ref 135–145)
Total Bilirubin: 0.5 mg/dL (ref 0.0–1.2)
Total Protein: 6.7 g/dL (ref 6.5–8.1)

## 2023-10-12 LAB — VITAMIN D 25 HYDROXY (VIT D DEFICIENCY, FRACTURES): Vit D, 25-Hydroxy: 54.97 ng/mL (ref 30–100)

## 2023-10-12 MED ORDER — DENOSUMAB 60 MG/ML ~~LOC~~ SOSY
60.0000 mg | PREFILLED_SYRINGE | Freq: Once | SUBCUTANEOUS | Status: AC
  Administered 2023-10-12: 60 mg via SUBCUTANEOUS
  Filled 2023-10-12: qty 1

## 2023-10-12 NOTE — Assessment & Plan Note (Signed)
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  On adjuvant Letrozole  [until summer-fall 2026].   # Tolerating well no major side effects noted except for mild joint pains/hot flashes. Mammo-April 2025[PCP]- Stable.  # joint pains- G-1 sec to letrozole .  Stable.  # Hot flashes- G-1 sec to letrozole . Stable.  # OSTEOPOROSIS [BMD-MARCH 2021]-T score -3; On Prolia  [2021]; SEP  2023- T-score of -2.9. AUg 2025- T score= -2.5-  Overall stable/improving- .  Continue Vit D 1000/day. Continue calcium  plus vitamin D  plus exercise program. Continue lifting weights/physical activity. Will continue prolia  for now-  Will switch to to reclast at netx visit. SABRA  - recommend increasing the ca+vit D BID- stable.  # DISPOSITION: # Prolia  today # Follow up in  12 month-- MD; labs-cbc; cmp; Vit D 25 OH- Reclast- IVDr.B

## 2023-10-12 NOTE — Progress Notes (Signed)
 Bone density 10/10/23.

## 2023-10-12 NOTE — Progress Notes (Signed)
 [facility] CONSULT NOTE  Patient Care Team: Dineen Rollene MATSU, FNP as PCP - General (Family Medicine) Dannielle Arlean FALCON, RN (Inactive) as Oncology Nurse Navigator Lenn Aran, MD as Radiation Oncologist (Radiation Oncology) Dessa Reyes ORN, MD as Consulting Physician (General Surgery) Rennie Cindy SAUNDERS, MD as Consulting Physician (Internal Medicine)  CHIEF COMPLAINTS/PURPOSE OF CONSULTATION: Breast cancer  #  Oncology History Overview Note  # MARCH 2021-right breast 11 o'clock position-s/p lumpectomy; SLNBx [Dr. Linnie ]pT1c (15 mm) p N0-grade-1; ER/PR positive HER-2 negative; Oncotype low risk [risk of recurrence is 5%]-no chemotherapy; s/p RT [08/12/2019]- Oncotyoe- 5% risk of recurrence. No chemo.   #Mid July 2021-letrozole   #Osteoporosis 2021-Prolia   # SURVIVORSHIP:   # GENETICS:   DIAGNOSIS: Right breast cancer  STAGE:   1      ;  GOALS: Cure  CURRENT/MOST RECENT THERAPY : Letrozole     Carcinoma of upper-outer quadrant of right breast in female, estrogen receptor positive (HCC)  05/22/2019 Initial Diagnosis   Carcinoma of upper-outer quadrant of right breast in female, estrogen receptor positive (HCC)     HISTORY OF PRESENTING ILLNESS: Alone.  Ambulating independently.  Jennifer Clayton 68 y.o.  female stage I breast cancer ER/PR positive Her-2 negative on letrozole / prolia  is here for follow-up- and review the Bone density 10/10/23   Taking letrozole  without side effects. Appetite is fairly good. Energy is good. Mild hot flashes.  Mild chronic joint pains.  No shortness of breath.   No jaw pain.   Review of Systems  Constitutional:  Negative for chills, diaphoresis, fever, malaise/fatigue and weight loss.  HENT:  Negative for nosebleeds and sore throat.   Eyes:  Negative for double vision.  Respiratory:  Negative for cough, hemoptysis, sputum production, shortness of breath and wheezing.   Cardiovascular:  Negative for chest pain,  palpitations, orthopnea and leg swelling.  Gastrointestinal:  Negative for abdominal pain, blood in stool, constipation, diarrhea, heartburn, melena, nausea and vomiting.  Genitourinary:  Negative for dysuria, frequency and urgency.  Musculoskeletal:  Positive for joint pain. Negative for back pain.  Skin: Negative.  Negative for itching and rash.  Neurological:  Negative for dizziness, tingling, focal weakness, weakness and headaches.  Endo/Heme/Allergies:  Does not bruise/bleed easily.  Psychiatric/Behavioral:  Negative for depression. The patient is not nervous/anxious and does not have insomnia.      MEDICAL HISTORY:  Past Medical History:  Diagnosis Date   Basal cell carcinoma 02/10/2022   Left lower eyelid. Nodular. 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 Fredirick office   BREAST LUMPECTOMY Right 05/13/2019   positive   BREAST LUMPECTOMY WITH SENTINEL LYMPH NODE  BIOPSY Right 05/13/2019   Procedure: BREAST LUMPECTOMY WITH SENTINEL LYMPH NODE BX;  Surgeon: Dessa Reyes ORN, MD;  Location: [facility] ORS;  Service: General;  Laterality: Right;   COLONOSCOPY WITH PROPOFOL  N/A 12/16/2021   Procedure: COLONOSCOPY WITH PROPOFOL ;  Surgeon: Jinny Carmine, MD;   Location: [facility] ENDOSCOPY;  Service: Endoscopy;  Laterality: N/A;   VAGINAL DELIVERY      SOCIAL HISTORY: Social History   Socioeconomic History   Marital status: Married    Spouse name: Not on file   Number of children: Not on file   Years of education: Not on file   Highest education level: Not on file  Occupational History   Not on file  Tobacco Use   Smoking status: Never   Smokeless tobacco: Never  Vaping Use   Vaping status: Never Used  Substance and Sexual Activity   Alcohol use: Yes    Alcohol/week: 1.0 standard drink of alcohol    Types: 1 Glasses of wine per week    Comment: rare   Drug use: Never   Sexual activity: Not on file  Other Topics Concern   Not on file  Social History Narrative   Lives in [place] with husband and son and 4 grandchildren.      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       Never smoked; social alcohol.       Social Drivers of Corporate investment banker Strain: Low Risk  (09/20/2023)   Overall Financial Resource Strain (CARDIA)    Difficulty of Paying Living Expenses: Not hard at all  Food Insecurity: No Food Insecurity (09/20/2023)   Hunger Vital Sign    Worried About Running Out of Food in the Last Year: Never true    Ran Out of Food in the Last Year: Never true  Transportation Needs: No Transportation Needs (09/20/2023)   PRAPARE - Administrator, Civil Service (Medical): No    Lack of Transportation (Non-Medical): No  Physical Activity: Sufficiently Active (09/20/2023)   Exercise Vital Sign    Days of Exercise per Week: 6 days    Minutes of Exercise per Session: 60 min  Stress: No Stress Concern Present (09/20/2023)   [employer] of Occupational Health - Occupational Stress Questionnaire    Feeling of Stress: Not at all  Social Connections: Moderately Isolated (09/20/2023)   Social Connection  and Isolation Panel    Frequency of Communication with Friends and Family: More than three times a week    Frequency of Social Gatherings with Friends and Family: More than three times a week    Attends Religious Services: Never    Database administrator or Organizations: No    Attends Banker Meetings: Never    Marital Status: Married  Catering manager Violence: Not At Risk (09/20/2023)   Humiliation, Afraid, Rape, and Kick questionnaire    Fear of Current or Ex-Partner: No    Emotionally Abused: No    Physically Abused: No    Sexually Abused: No    FAMILY HISTORY: Family History  Problem Relation Age of Onset   Cancer Mother        lung, non-smoker   Dementia Father        related to traumatic brain injury   Breast cancer Neg Hx  ALLERGIES:  is allergic to zomepirac, amoxicillin-pot clavulanate, and other.  MEDICATIONS:  Current Outpatient Medications  Medication Sig Dispense Refill   albuterol  (VENTOLIN  HFA) 108 (90 Base) MCG/ACT inhaler Inhale 2 puffs into the lungs every 6 (six) hours as needed for wheezing or shortness of breath. 8 g 0   amLODipine  (NORVASC ) 5 MG tablet TAKE 1 TABLET BY MOUTH ONCE DAILY 90 tablet 1   Ascorbic Acid (VITAMIN C PO) Take 1 tablet by mouth daily.     buPROPion  (WELLBUTRIN  XL) 300 MG 24 hr tablet TAKE 1 TABLET BY MOUTH DAILY 90 tablet 1   calcium  carbonate (OS-CAL - DOSED IN MG OF ELEMENTAL CALCIUM ) 1250 (500 Ca) MG tablet Take 1 tablet by mouth daily.     clobetasol  cream (TEMOVATE ) 0.05 % Apply 1 Application topically 2 (two) times daily. Avoid applying to face, groin, and axilla. Use as directed. Long-term use can cause thinning of the skin. (Patient taking differently: Apply 1 Application topically 2 (two) times daily as needed. Avoid applying to face, groin, and axilla. Use as directed. Long-term use can cause thinning of the skin.) 30 g 0   denosumab  (PROLIA ) 60 MG/ML SOSY injection Inject 60 mg into the skin every 6 (six)  months.     fluorouracil  (EFUDEX ) 5 % cream Apply twice daily to right forehead, right upper lip, left temple for 7 days 40 g 1   fluorouracil  (EFUDEX ) 5 % cream Wait two weeks after the biopsy to start applying the cream. Apply the cream to left lower anterior leg twice per day until the redness and irritation develop (usually occurs by day 7), then stop and allow it to heal 30 g 2   Ivermectin  (SOOLANTRA ) 1 % CREA APPLY THIN LAYER TO FACE DAILY 45 g 5   letrozole  (FEMARA ) 2.5 MG tablet TAKE ONE TABLET BY MOUTH EVERY DAY 90 tablet 3   levothyroxine  (SYNTHROID ) 100 MCG tablet Takes 1 tablet 6 days per week     Multiple Vitamins-Minerals (MULTIVITAMIN ADULT PO) Take 1 tablet by mouth daily.     mupirocin  ointment (BACTROBAN ) 2 % Apply to skin qd-bid (Patient taking differently: Apply to skin qd-bid as needed) 22 g 1   omeprazole  (PRILOSEC) 20 MG capsule Take 1 capsule (20 mg total) by mouth daily. (Patient taking differently: Take 20 mg by mouth daily as needed.) 30 capsule 0   ondansetron  (ZOFRAN ) 4 MG tablet Take 1 tablet (4 mg total) by mouth every 8 (eight) hours as needed for nausea or vomiting. 30 tablet 0   rosuvastatin  (CRESTOR ) 5 MG tablet TAKE 1 TABLET BY MOUTH EVERY EVENING. 90 tablet 3   SUMAtriptan  (IMITREX ) 50 MG tablet May repeat in 2 hours if headache persists or recurs. 15 tablet 2   triamcinolone  (KENALOG ) 0.1 % paste Apply twice daily to ulcers in mouth. Dry area before application (Patient taking differently: Apply twice daily to ulcers in mouth. Dry area before application as needed) 5 g 2   zolpidem  (AMBIEN ) 5 MG tablet TAKE 1 TABLET BY MOUTH AT BEDTIME 30 tablet 2   No current facility-administered medications for this visit.      SABRA  PHYSICAL EXAMINATION: ECOG PERFORMANCE STATUS: 0 - Asymptomatic  Vitals:   10/12/23 0954  BP: 122/78  Pulse: 68  Resp: 16  Temp: 98 F (36.7 C)  SpO2: 99%    Filed Weights   10/12/23 0954  Weight: 122 lb 12.8 oz (55.7 kg)      Physical Exam HENT:  Head: Normocephalic and atraumatic.     Mouth/Throat:     Pharynx: No oropharyngeal exudate.  Eyes:     Pupils: Pupils are equal, round, and reactive to light.  Cardiovascular:     Rate and Rhythm: Normal rate and regular rhythm.  Pulmonary:     Effort: Pulmonary effort is normal. No respiratory distress.     Breath sounds: Normal breath sounds. No wheezing.  Abdominal:     General: Bowel sounds are normal. There is no distension.     Palpations: Abdomen is soft. There is no mass.     Tenderness: There is no abdominal tenderness. There is no guarding or rebound.  Musculoskeletal:        General: No tenderness. Normal range of motion.     Cervical back: Normal range of motion and neck supple.  Skin:    General: Skin is warm.  Neurological:     Mental Status: She is alert and oriented to person, place, and time.  Psychiatric:        Mood and Affect: Affect normal.      LABORATORY DATA:  I have reviewed the data as listed Lab Results  Component Value Date   WBC 4.7 10/12/2023   HGB 13.0 10/12/2023   HCT 39.4 10/12/2023   MCV 95.9 10/12/2023   PLT 243 10/12/2023   Recent Labs    04/11/23 1254 10/12/23 1013  NA 135 136  K 4.2 4.5  CL 102 102  CO2 26 27  GLUCOSE 104* 82  BUN 10 17  CREATININE 0.81 0.80  CALCIUM  8.7* 9.1  GFRNONAA >60 >60  PROT 6.9 6.7  ALBUMIN 4.0 3.9  AST 21 17  ALT 18 15  ALKPHOS 49 47  BILITOT 0.5 0.5    RADIOGRAPHIC STUDIES: I have personally reviewed the radiological images as listed and agreed with the findings in the report. DG Bone Density Result Date: 10/09/2023 EXAM: DUAL X-RAY ABSORPTIOMETRY (DXA) FOR BONE MINERAL DENSITY 10/09/2023 11:24 am CLINICAL DATA:  68 year old Female Postmenopausal. History of breast cancer TECHNIQUE: An axial (e.g., hips, spine) and/or appendicular (e.g., radius) exam was performed, as appropriate, using GE Secretary/administrator at [facility]. Images are  obtained for bone mineral density measurement and are not obtained for diagnostic purposes. [id number] Exclusions: L1 due to degenerative changes. COMPARISON:  10/06/2021. FINDINGS: Scan quality: Good. LUMBAR SPINE (L2-L4): BMD (in g/cm2): 0.908 T-score: -2.5 Z-score: -0.8 Rate of change from previous exam: 5.7 % LEFT FEMORAL NECK: BMD (in g/cm2): 0.715 T-score: -2.3 Z-score: -0.6 LEFT TOTAL HIP: BMD (in g/cm2): 0.762 T-score: -2.0 Z-score: -0.5 RIGHT FEMORAL NECK: BMD (in g/cm2): 0.699 T-score: -2.4 Z-score: -0.7 RIGHT TOTAL HIP: BMD (in g/cm2): 0.737 T-score: -2.1 Z-score: -0.7 DUAL-FEMUR TOTAL MEAN: Rate of change from previous exam: No significant rate of change from previous exam. FRAX 10-YEAR PROBABILITY OF FRACTURE: World Health Organization FRAX assessment of absolute fracture risk is not calculated for this patient because the patient has osteoporosis. IMPRESSION: Osteoporosis based on BMD. Fracture risk is increased. Increased risk is based on low BMD, FRAX calculation. RECOMMENDATIONS: 1. All patients should optimize calcium  and vitamin D  intake. 2. Consider FDA-approved medical therapies in postmenopausal women and men aged 67 years and older, based on the following: - A hip or vertebral (clinical or morphometric) fracture - T-score less than or equal to -2.5 and secondary causes have been excluded. - Low bone mass (T-score between -1.0 and -2.5) and a 10-year probability of a hip  fracture greater than or equal to 3% or a 10-year probability of a major osteoporosis-related fracture greater than or equal to 20% based on the [place] -adapted WHO algorithm. - Clinician judgment and/or patient preferences may indicate treatment for people with 10-year fracture probabilities above or below these levels 3. Patients with diagnosis of osteoporosis or at high risk for fracture should have regular bone mineral density tests. For patients eligible for Medicare, routine testing is allowed once every 2 years. The testing  frequency can be increased to one year for patients who have rapidly progressing disease, those who are receiving or discontinuing medical therapy to restore bone mass, or have additional risk factors. Electronically Signed   By: Reyes Phi M.D.   On: 10/09/2023 15:06    ASSESSMENT & PLAN:   Carcinoma of upper-outer quadrant of right breast in female, estrogen receptor positive (HCC) # Invasive mammary carcinoma right breast-pT1cpN0; grade 1 ER positive PR negative HER-2 negative.  On adjuvant Letrozole  [until summer-fall 2026].   # Tolerating well no major side effects noted except for mild joint pains/hot flashes. Mammo-April 2025[PCP]- Stable.  # joint pains- G-1 sec to letrozole .  Stable.  # Hot flashes- G-1 sec to letrozole . Stable.  # OSTEOPOROSIS [BMD-MARCH 2021]-T score -3; On Prolia  [2021]; SEP  2023- T-score of -2.9. AUg 2025- T score= -2.5-  Overall stable/improving- .  Continue Vit D 1000/day. Continue calcium  plus vitamin D  plus exercise program. Continue lifting weights/physical activity. Will continue prolia  for now-  Will switch to to reclast at netx visit. SABRA  - recommend increasing the ca+vit D BID- stable.  # DISPOSITION: # Prolia  today # Follow up in  12 month-- MD; labs-cbc; cmp; Vit D 25 OH- Reclast- IVDr.B   All questions were answered. The patient/family knows to call the clinic with any problems, questions or concerns.    Cindy JONELLE Joe, MD 10/12/2023 11:19 AM

## 2023-10-19 ENCOUNTER — Ambulatory Visit: Admitting: Dermatology

## 2023-11-23 ENCOUNTER — Ambulatory Visit: Admitting: Dermatology

## 2023-12-01 ENCOUNTER — Encounter: Payer: Self-pay | Admitting: Internal Medicine

## 2023-12-11 ENCOUNTER — Ambulatory Visit: Admitting: Family

## 2023-12-11 VITALS — BP 124/62 | HR 71 | Temp 97.6°F | Ht 63.0 in | Wt 125.0 lb

## 2023-12-11 DIAGNOSIS — G43809 Other migraine, not intractable, without status migrainosus: Secondary | ICD-10-CM | POA: Diagnosis not present

## 2023-12-11 DIAGNOSIS — G4700 Insomnia, unspecified: Secondary | ICD-10-CM

## 2023-12-11 DIAGNOSIS — Z23 Encounter for immunization: Secondary | ICD-10-CM

## 2023-12-11 MED ORDER — ZOLPIDEM TARTRATE 5 MG PO TABS: 5.0000 mg | ORAL_TABLET | Freq: Every day | ORAL | 2 refills | Status: AC

## 2023-12-11 NOTE — Progress Notes (Signed)
 Assessment & Plan:  Need for influenza vaccination -     Flu vaccine HIGH DOSE PF(Fluzone Trivalent)  Insomnia, unspecified type Assessment & Plan: Chronic, stable. She usually takes half to full tablet of ambien  each night.  Continue ambien  5mg  at bedtime   Orders: -     Zolpidem  Tartrate; Take 1 tablet (5 mg total) by mouth at bedtime.  Dispense: 30 tablet; Refill: 2  Other migraine without status migrainosus, not intractable Assessment & Plan: Reassuring neurologic exam today.  Migraine resolved.  Question if viral etiology such as COVID.  Long discussion as relates to staying vigilant for headache presentation if severity or features were different to prior to have a very low threshold to presenting to the emergency room for neuroimaging to exclude more worrisome etiology such as CVA.  Discussed trial of over-the-counter magnesium citrate.      Return precautions given.   Risks, benefits, and alternatives of the medications and treatment plan prescribed today were discussed, and patient expressed understanding.   Education regarding symptom management and diagnosis given to patient on AVS either electronically or printed.  Return in about 3 months (around 03/12/2024).  Rollene Northern, FNP  Subjective:    Patient ID: Jennifer Clayton, female    DOB: [birth year], 68 y.o.   MRN: [id number]  CC: Jennifer Clayton is a 68 y.o. female who presents today for follow up.   HPI: HPI Discussed the use of AI scribe software for clinical note transcription with the patient, who gave verbal consent to proceed.  History of Present Illness   Jennifer Clayton is a 68 year old female with a history of right breast cancer who presents for a follow-up visit.  She has a history of right breast cancer diagnosed five years ago and is currently on letrozole  and Prolia . Her last mammogram was in April.   She recently experienced an episode of headache and dizziness last week, which  she attributes to a virus she and her grandson had. The symptoms began with a headache and dizziness, accompanied by sore glands and nasal congestion.Dizziness resolved.    She used Imitrex  for the headaches, which were similar to her previous migraines, typically occurring on the left side.   She took approximately 25 mg of Imitrex  to manage the symptoms.  She has noticed that her migraines are cyclical.These headaches are similar to her past migraines, without vision changes or numbness.  She is on calcium  and vitamin D  supplements.   Follow-up oncology 10/12/2023 for history of right breast cancer, osteoporosis continue letrozole ; continued on Prolia  Allergies: Zomepirac, Amoxicillin-pot clavulanate, and Other Current Outpatient Medications on File Prior to Visit  Medication Sig Dispense Refill   amLODipine  (NORVASC ) 5 MG tablet TAKE 1 TABLET BY MOUTH ONCE DAILY 90 tablet 1   Ascorbic Acid (VITAMIN C PO) Take 1 tablet by mouth daily.     buPROPion  (WELLBUTRIN  XL) 300 MG 24 hr tablet TAKE 1 TABLET BY MOUTH DAILY 90 tablet 1   calcium  carbonate (OS-CAL - DOSED IN MG OF ELEMENTAL CALCIUM ) 1250 (500 Ca) MG tablet Take 1 tablet by mouth daily.     clobetasol  cream (TEMOVATE ) 0.05 % Apply 1 Application topically 2 (two) times daily. Avoid applying to face, groin, and axilla. Use as directed. Long-term use can cause thinning of the skin. (Patient taking differently: Apply 1 Application topically 2 (two) times daily as needed. Avoid applying to face, groin, and axilla. Use as directed. Long-term use can cause thinning  of the skin.) 30 g 0   denosumab  (PROLIA ) 60 MG/ML SOSY injection Inject 60 mg into the skin every 6 (six) months.     fluorouracil  (EFUDEX ) 5 % cream Apply twice daily to right forehead, right upper lip, left temple for 7 days 40 g 1   fluorouracil  (EFUDEX ) 5 % cream Wait two weeks after the biopsy to start applying the cream. Apply the cream to left lower anterior leg twice per day  until the redness and irritation develop (usually occurs by day 7), then stop and allow it to heal 30 g 2   Ivermectin  (SOOLANTRA ) 1 % CREA APPLY THIN LAYER TO FACE DAILY 45 g 5   letrozole  (FEMARA ) 2.5 MG tablet TAKE ONE TABLET BY MOUTH EVERY DAY 90 tablet 3   levothyroxine  (SYNTHROID ) 100 MCG tablet Takes 1 tablet 6 days per week     Multiple Vitamins-Minerals (MULTIVITAMIN ADULT PO) Take 1 tablet by mouth daily.     mupirocin  ointment (BACTROBAN ) 2 % Apply to skin qd-bid (Patient taking differently: Apply to skin qd-bid as needed) 22 g 1   omeprazole  (PRILOSEC) 20 MG capsule Take 1 capsule (20 mg total) by mouth daily. (Patient taking differently: Take 20 mg by mouth daily as needed.) 30 capsule 0   ondansetron  (ZOFRAN ) 4 MG tablet Take 1 tablet (4 mg total) by mouth every 8 (eight) hours as needed for nausea or vomiting. 30 tablet 0   rosuvastatin  (CRESTOR ) 5 MG tablet TAKE 1 TABLET BY MOUTH EVERY EVENING. 90 tablet 3   SUMAtriptan  (IMITREX ) 50 MG tablet May repeat in 2 hours if headache persists or recurs. 15 tablet 2   triamcinolone  (KENALOG ) 0.1 % paste Apply twice daily to ulcers in mouth. Dry area before application (Patient taking differently: Apply twice daily to ulcers in mouth. Dry area before application as needed) 5 g 2   albuterol  (VENTOLIN  HFA) 108 (90 Base) MCG/ACT inhaler Inhale 2 puffs into the lungs every 6 (six) hours as needed for wheezing or shortness of breath. 8 g 0   No current facility-administered medications on file prior to visit.    Review of Systems  Constitutional:  Negative for chills and fever.  Eyes:  Negative for visual disturbance.  Respiratory:  Negative for cough.   Cardiovascular:  Negative for chest pain and palpitations.  Gastrointestinal:  Negative for nausea and vomiting.  Neurological:  Positive for headaches. Negative for dizziness.      Objective:    BP 124/62   Pulse 71   Temp 97.6 F (36.4 C)   Ht 5' 3 (1.6 m)   Wt 125 lb (56.7 kg)    SpO2 98%   BMI 22.14 kg/m  BP Readings from Last 3 Encounters:  12/11/23 124/62  10/12/23 122/78  08/09/23 128/78   Wt Readings from Last 3 Encounters:  12/11/23 125 lb (56.7 kg)  10/12/23 122 lb 12.8 oz (55.7 kg)  09/20/23 130 lb (59 kg)    Physical Exam Vitals reviewed.  Constitutional:      Appearance: She is well-developed.  HENT:     Mouth/Throat:     Pharynx: Uvula midline.  Eyes:     Conjunctiva/sclera: Conjunctivae normal.     Pupils: Pupils are equal, round, and reactive to light.     Comments: Fundus normal bilaterally.   Cardiovascular:     Rate and Rhythm: Normal rate and regular rhythm.     Pulses: Normal pulses.     Heart sounds: Normal heart sounds.  Pulmonary:     Effort: Pulmonary effort is normal.     Breath sounds: Normal breath sounds. No wheezing, rhonchi or rales.  Skin:    General: Skin is warm and dry.  Neurological:     Mental Status: She is alert.     Cranial Nerves: No cranial nerve deficit.     Sensory: No sensory deficit.     Deep Tendon Reflexes:     Reflex Scores:      Bicep reflexes are 2+ on the right side and 2+ on the left side.      Patellar reflexes are 2+ on the right side and 2+ on the left side.    Comments: Grip equal and strong bilateral upper extremities. Gait strong and steady. Able to perform rapid alternating movement without difficulty.   Psychiatric:        Speech: Speech normal.        Behavior: Behavior normal.        Thought Content: Thought content normal.

## 2023-12-11 NOTE — Assessment & Plan Note (Signed)
 Chronic, stable. She usually takes half to full tablet of ambien  each night.  Continue ambien  5mg  at bedtime

## 2023-12-11 NOTE — Patient Instructions (Signed)
 Consider magnesium citrate 400mg  daily for headache prevention  Nice to see you!

## 2023-12-11 NOTE — Assessment & Plan Note (Signed)
 Reassuring neurologic exam today.  Migraine resolved.  Question if viral etiology such as COVID.  Long discussion as relates to staying vigilant for headache presentation if severity or features were different to prior to have a very low threshold to presenting to the emergency room for neuroimaging to exclude more worrisome etiology such as CVA.  Discussed trial of over-the-counter magnesium citrate.

## 2023-12-16 ENCOUNTER — Other Ambulatory Visit: Payer: Self-pay | Admitting: Family

## 2023-12-25 ENCOUNTER — Ambulatory Visit: Admitting: Dermatology

## 2024-01-04 ENCOUNTER — Encounter: Payer: Self-pay | Admitting: Dermatology

## 2024-01-04 ENCOUNTER — Ambulatory Visit: Admitting: Dermatology

## 2024-01-04 DIAGNOSIS — Z86007 Personal history of in-situ neoplasm of skin: Secondary | ICD-10-CM | POA: Diagnosis not present

## 2024-01-04 DIAGNOSIS — Z08 Encounter for follow-up examination after completed treatment for malignant neoplasm: Secondary | ICD-10-CM | POA: Diagnosis not present

## 2024-01-04 NOTE — Progress Notes (Signed)
   Follow-Up Visit   Subjective  Jennifer Clayton is a 68 y.o. 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  Objective  Well appearing patient in no apparent distress; mood and affect are within normal limits.   A focused examination was performed of the following areas: L lower leg  Relevant exam findings are noted in the Assessment and Plan.    Assessment & Plan   HISTORY OF SQUAMOUS CELL CARCINOMA IN SITU OF THE SKIN - No evidence of recurrence today - Recommend regular full body skin exams - Recommend daily broad spectrum sunscreen SPF 30+ to sun-exposed areas, reapply every 2 hours as needed.  - Call if any new or changing lesions are noted between office visits  - L lower ant leg, clear with 5FU/Calcipotriene treatment  HISTORY OF SQUAMOUS CELL CARCINOMA IN SITU (SCCIS)    Return for as schedueled for 50m TBSE, Hx of BCC, Hx of SCC IS, Hx of AKs.  I, Grayce Saunas, RMA, am acting as scribe for Boneta Sharps, MD .   Documentation: I have reviewed the above documentation for accuracy and completeness, and I agree with the above.  Boneta Sharps, MD

## 2024-01-04 NOTE — Patient Instructions (Signed)

## 2024-02-13 ENCOUNTER — Ambulatory Visit: Admitting: Dermatology

## 2024-03-14 ENCOUNTER — Ambulatory Visit: Admitting: Family

## 2024-09-24 ENCOUNTER — Ambulatory Visit

## 2024-10-11 ENCOUNTER — Ambulatory Visit: Admitting: Internal Medicine

## 2024-10-11 ENCOUNTER — Other Ambulatory Visit

## 2024-10-11 ENCOUNTER — Ambulatory Visit
